# Patient Record
Sex: Female | Born: 1940
Health system: Southern US, Community
[De-identification: ages and names within clinical notes are randomized; demographics above are authoritative.]

## PROBLEM LIST (undated history)

## (undated) DIAGNOSIS — I493 Ventricular premature depolarization: Secondary | ICD-10-CM

## (undated) DIAGNOSIS — D751 Secondary polycythemia: Secondary | ICD-10-CM

## (undated) DIAGNOSIS — E785 Hyperlipidemia, unspecified: Secondary | ICD-10-CM

## (undated) DIAGNOSIS — F419 Anxiety disorder, unspecified: Secondary | ICD-10-CM

## (undated) DIAGNOSIS — Z889 Allergy status to unspecified drugs, medicaments and biological substances status: Secondary | ICD-10-CM

## (undated) DIAGNOSIS — G4700 Insomnia, unspecified: Secondary | ICD-10-CM

## (undated) DIAGNOSIS — G5762 Lesion of plantar nerve, left lower limb: Secondary | ICD-10-CM

## (undated) DIAGNOSIS — I10 Essential (primary) hypertension: Secondary | ICD-10-CM

## (undated) DIAGNOSIS — M1990 Unspecified osteoarthritis, unspecified site: Secondary | ICD-10-CM

## (undated) HISTORY — DX: Secondary polycythemia: D75.1

## (undated) HISTORY — DX: Ventricular premature depolarization: I49.3

## (undated) HISTORY — DX: Hyperlipidemia, unspecified: E78.5

## (undated) HISTORY — DX: Insomnia, unspecified: G47.00

## (undated) HISTORY — PX: ABDOMINAL HYSTERECTOMY: SHX81

## (undated) HISTORY — DX: Allergy status to unspecified drugs, medicaments and biological substances: Z88.9

## (undated) HISTORY — DX: Unspecified osteoarthritis, unspecified site: M19.90

## (undated) HISTORY — PX: VESICOVAGINAL FISTULA CLOSURE W/ TAH: SUR271

## (undated) HISTORY — PX: KNEE ARTHROSCOPY: SUR90

## (undated) HISTORY — DX: Lesion of plantar nerve, left lower limb: G57.62

## (undated) HISTORY — PX: TONSILLECTOMY: SHX5217

## (undated) HISTORY — DX: Essential (primary) hypertension: I10

## (undated) HISTORY — DX: Anxiety disorder, unspecified: F41.9

---

## 1997-10-10 ENCOUNTER — Ambulatory Visit (HOSPITAL_COMMUNITY): Admission: RE | Admit: 1997-10-10 | Discharge: 1997-10-10 | Payer: Self-pay | Admitting: Gastroenterology

## 1998-09-25 ENCOUNTER — Other Ambulatory Visit: Admission: RE | Admit: 1998-09-25 | Discharge: 1998-09-25 | Payer: Self-pay | Admitting: *Deleted

## 1998-10-08 ENCOUNTER — Other Ambulatory Visit: Admission: RE | Admit: 1998-10-08 | Discharge: 1998-10-08 | Payer: Self-pay | Admitting: *Deleted

## 1999-08-17 ENCOUNTER — Other Ambulatory Visit: Admission: RE | Admit: 1999-08-17 | Discharge: 1999-08-17 | Payer: Self-pay | Admitting: *Deleted

## 2001-11-13 ENCOUNTER — Other Ambulatory Visit: Admission: RE | Admit: 2001-11-13 | Discharge: 2001-11-13 | Payer: Self-pay | Admitting: Internal Medicine

## 2002-12-18 ENCOUNTER — Encounter (INDEPENDENT_AMBULATORY_CARE_PROVIDER_SITE_OTHER): Payer: Self-pay

## 2002-12-18 ENCOUNTER — Ambulatory Visit (HOSPITAL_COMMUNITY): Admission: RE | Admit: 2002-12-18 | Discharge: 2002-12-18 | Payer: Self-pay | Admitting: Gastroenterology

## 2005-12-10 ENCOUNTER — Other Ambulatory Visit: Admission: RE | Admit: 2005-12-10 | Discharge: 2005-12-10 | Payer: Self-pay | Admitting: Internal Medicine

## 2007-07-30 ENCOUNTER — Emergency Department (HOSPITAL_BASED_OUTPATIENT_CLINIC_OR_DEPARTMENT_OTHER): Admission: EM | Admit: 2007-07-30 | Discharge: 2007-07-30 | Payer: Self-pay | Admitting: Emergency Medicine

## 2009-05-04 ENCOUNTER — Encounter: Payer: Self-pay | Admitting: Cardiovascular Disease

## 2009-05-05 ENCOUNTER — Encounter: Payer: Self-pay | Admitting: Internal Medicine

## 2009-06-05 DIAGNOSIS — I10 Essential (primary) hypertension: Secondary | ICD-10-CM | POA: Insufficient documentation

## 2009-06-05 DIAGNOSIS — E782 Mixed hyperlipidemia: Secondary | ICD-10-CM | POA: Insufficient documentation

## 2009-06-06 ENCOUNTER — Ambulatory Visit: Payer: Self-pay | Admitting: Internal Medicine

## 2009-06-17 ENCOUNTER — Ambulatory Visit: Payer: Self-pay

## 2009-06-17 ENCOUNTER — Ambulatory Visit (HOSPITAL_COMMUNITY): Admission: RE | Admit: 2009-06-17 | Discharge: 2009-06-17 | Payer: Self-pay | Admitting: Internal Medicine

## 2009-06-17 ENCOUNTER — Ambulatory Visit: Payer: Self-pay | Admitting: Cardiology

## 2009-06-17 ENCOUNTER — Encounter: Payer: Self-pay | Admitting: Internal Medicine

## 2009-06-20 ENCOUNTER — Encounter: Payer: Self-pay | Admitting: Internal Medicine

## 2009-07-14 ENCOUNTER — Ambulatory Visit: Payer: Self-pay

## 2009-07-14 ENCOUNTER — Ambulatory Visit: Payer: Self-pay | Admitting: Internal Medicine

## 2009-07-14 ENCOUNTER — Encounter: Payer: Self-pay | Admitting: Internal Medicine

## 2009-07-14 ENCOUNTER — Ambulatory Visit (HOSPITAL_COMMUNITY): Admission: RE | Admit: 2009-07-14 | Discharge: 2009-07-14 | Payer: Self-pay | Admitting: Internal Medicine

## 2010-03-12 NOTE — Miscellaneous (Signed)
  Clinical Lists Changes  Orders: Added new Referral order of Stress Echo (Stress Echo) - Signed 

## 2010-03-12 NOTE — Cardiovascular Report (Signed)
Summary: Outpatient Coinsurance Notice   Outpatient Coinsurance Notice   Imported By: Roderic Ovens 06/23/2009 12:26:38  _____________________________________________________________________  External Attachment:    Type:   Image     Comment:   External Document

## 2010-03-12 NOTE — Letter (Signed)
Summary: [facility]  [facility]   Imported By: Debby Freiberg 08/05/2009 11:34:22  _____________________________________________________________________  External Attachment:    Type:   Image     Comment:   External Document

## 2010-03-12 NOTE — Assessment & Plan Note (Signed)
Summary: NP6/History Heart Disease-mb   Primary Provider:  Dr. Marisue Brooklyn  CC:  sob, pt states she has been having alot of stress in her life, and fatigue.  History of Present Illness: Patient is a 70 year old with a history of hypertension and dyslipidemia.  She was referred for evaluation of an abnormal EKG. The patient is followed by A. Elisabeth Most.  She was seen in clinic recently.  EKG showed some nonspecific ST T wave changes.  WIth her history and with famiy history of vascular issues she was referred for cardiology evaluation The patient denies chest pains.  She is under increased stress with family issues.  She says she feels more fatigued, sometimes like she cant keep up like she is used to .  Questions if it is due to the increased stress and increased demands that she is facing.  Current Medications (verified): 1)  Aspirin 81 Mg Tbec (Aspirin) .... Take One Tablet By Mouth Daily 2)  Calcium 1800 Mg Tabs (Calcium Carbonate) .Marland Kitchen.. 1 Tab By Mouth Once Daily 3)  Fish Oil   Oil (Fish Oil) .Marland Kitchen.. 1 Tab By Mouth Once Daily 4)  Cvs Cranberry 475 Mg Caps (Cranberry) .Marland Kitchen.. 1 Tab By Mouth Once Daily 5)  Vitamin D3 2000 Unit Caps (Cholecalciferol) .Marland Kitchen.. 1 Tab Po Once Daily 6)  Zyrtec Allergy 10 Mg Tabs (Cetirizine Hcl) .... As Needed 7)  Multivitamins   Tabs (Multiple Vitamin) .Marland Kitchen.. 1 Tab Po Once Daily 8)  Simvastatin 80 Mg Tabs (Simvastatin) .... Take One Tablet By Mouth Daily At Bedtime 9)  Quinapril Hcl 40 Mg Tabs (Quinapril Hcl) .Marland Kitchen.. 1 Tab By Mouth Once Daily 10)  Hydrochlorothiazide 12.5 Mg Tabs (Hydrochlorothiazide) .... Take One Tablet By Mouth Daily. 11)  Zolpidem Tartrate 10 Mg Tabs (Zolpidem Tartrate) .... As Needed 12)  Glucosamine-Chondroitin   Caps (Glucosamine-Chondroit-Vit C-Mn) .Marland Kitchen.. 1 Tab By Mouth Once Daily  Allergies: 1)  ! Pcn  Past History:  Past Medical History: Dyslipidemia .  On Rx for 20 years. Hypertension Allergies. PCV  Past Surgical  History: Tonsillectomy Hysterectomy Arthoscopy of R knee  Family History: Mother with hx of angina.  Died of necrosis of intestines at age 40 Sister diedat age 43 of necrotic intestines Brothers *(2) with increased lipids One brother with Polycythemia Vera  Social History: Married.  No tobacco  Rare EtOH  Review of Systems       All systems reviewed.  negative to the above problem except as noted abov.e  Vital Signs:  Patient profile:   70 year old female Height:      65 inches Weight:      182 pounds BMI:     30.40 Pulse rate:   73 / minute Resp:     14 per minute BP sitting:   116 / 75  (left arm)  Vitals Entered By: Kem Parkinson (June 06, 2009 12:12 PM)  Physical Exam  Additional Exam:  Patient is in NAD HEENT:  Normocephalic, atraumatic. EOMI, PERRLA.  Neck: JVP is normal. No thyromegaly. No bruits.  Lungs: clear to auscultation. No rales no wheezes.  Heart: Regular rate and rhythm. Normal S1, S2. No S3.   No significant murmurs. PMI not displaced.  Abdomen:  Supple, nontender. Normal bowel sounds. No masses. No hepatomegaly.  Extremities:   Good distal pulses throughout. No lower extremity edema.  Musculoskeletal :moving all extremities.  Neuro:   alert and oriented x3.    EKG  Procedure date:  06/06/2009  Findings:  NSR.  73 bpm.  Occasional PVC. T wave inversion III, AVF.  Different from outside EKGs.  Impression & Recommendations:  Problem # 1:  OTHER DYSPNEA AND RESPIRATORY ABNORMALITIES (ICD-786.09) Patient complains of some giving out with activities.  Does have nonspecific EKG findings but they are changing.  I would recommend and echo to start.  If LV is normal and windows are good  rec stress echo, otherwise stress myoview.   COntinue activities as tolerated  Problem # 2:  HYPERTENSION (ICD-401.9) Good control Her updated medication list for this problem includes:    Aspirin 81 Mg Tbec (Aspirin) .Marland Kitchen... Take one tablet by mouth daily     Quinapril Hcl 40 Mg Tabs (Quinapril hcl) .Marland Kitchen... 1 tab by mouth once daily    Hydrochlorothiazide 12.5 Mg Tabs (Hydrochlorothiazide) .Marland Kitchen... Take one tablet by mouth daily.  Orders: EKG w/ Interpretation (93000)  Problem # 3:  HYPERCHOLESTEROLEMIA (ICD-272.0) Good control on recent check by Dr. Elisabeth Most.  Other Orders: Echocardiogram (Echo)  Patient Instructions: 1)  Your physician has requested that you have an echocardiogram.  Echocardiography is a painless test that uses sound waves to create images of your heart. It provides your doctor with information about the size and shape of your heart and how well your heart's chambers and valves are working.  This procedure takes approximately one hour. There are no restrictions for this procedure. we will call you with results.

## 2010-06-26 NOTE — Op Note (Signed)
NAMEKARLEY, PHO NO.:  [id number]   MEDICAL RECORD NO.:  [id number]                   PATIENT TYPE:  AMB   LOCATION:  ENDO                                 FACILITY:  [facility]   PHYSICIAN:  Petra Kuba, M.D.                 DATE OF BIRTH:  [birth year]   DATE OF PROCEDURE:  12/18/2002  DATE OF DISCHARGE:                                 OPERATIVE REPORT   PROCEDURE:  Colonoscopy with biopsy.   INDICATIONS FOR PROCEDURE:  A patient with a history of colon polyps due for  colonic screening.  Consent was signed after risks, benefits, methods, and  options were thoroughly discussed in the past.   MEDICINES USED:  Demerol 80, Versed 7.   DESCRIPTION OF PROCEDURE:  Rectal inspection was pertinent for external  hemorrhoids. Digital exam was negative. The video pediatric adjustable  colonoscope was inserted and fairly easily advanced around the colon to the  cecum. This did require some abdominal pressure but no position changes. No  obvious abnormality was seen on insertion. The cecum was identified by the  appendiceal orifice and the ileocecal valve. In fact, the scope was inserted  for a quick look into the terminal ileum which on quick evaluation was  grossly normal. The scope was slowly withdrawn.  Unfortunately the prep was  only fairly adequate. There was a moderate amount of stool residue which  could not all be washed and suctioned but on slow withdrawal through the  colon no abnormalities were seen until we withdrew back to the rectum.  Specifically, no polyps, tumors, masses or diverticula were seen until the  rectum. In the rectum on retroflexion, a small polyp was seen and was cold  biopsied x2.  There were also some small internal hemorrhoids. Anal rectal  pull through confirmed the hemorrhoids. The scope was reinserted a short  ways up the left side of the colon, air was suctioned, scope removed.  The  patient tolerated the procedure well.  There was no obvious or immediate  complications.   ENDOSCOPIC DIAGNOSIS:  1. Internal and external hemorrhoids.  2. Tiny rectal polyps cold biopsied.  3. Fairly adequate prep but could miss small lesions.  4. Otherwise within normal limits to the cecum and a quick look in the     terminal ileum.   PLAN:  Await pathology. Would probably recheck colon screening in five  years. Happy to see back p.r.n. otherwise return care to Dr. Elisabeth Most for  the customary health care maintenance to include yearly rectals and guaiacs.                                               Petra Kuba, M.D.   MEM/MEDQ  D:  12/18/2002  T:  12/18/2002  Job:  [id number]   cc:   Lovenia Kim, D.O.  [address]  [place]  [place] [address]  Fax: [phone]

## 2010-08-13 ENCOUNTER — Encounter: Payer: Self-pay | Admitting: Internal Medicine

## 2012-05-15 LAB — HM COLONOSCOPY

## 2012-12-17 ENCOUNTER — Other Ambulatory Visit: Payer: Self-pay | Admitting: Internal Medicine

## 2012-12-18 ENCOUNTER — Other Ambulatory Visit: Payer: Self-pay | Admitting: Emergency Medicine

## 2012-12-18 ENCOUNTER — Other Ambulatory Visit: Payer: Self-pay | Admitting: Internal Medicine

## 2012-12-18 ENCOUNTER — Telehealth: Payer: Self-pay | Admitting: *Deleted

## 2012-12-18 DIAGNOSIS — G4700 Insomnia, unspecified: Secondary | ICD-10-CM

## 2012-12-18 MED ORDER — LORAZEPAM 2 MG PO TABS: 2.0000 mg | ORAL_TABLET | Freq: Every day | ORAL | Status: DC

## 2012-12-18 NOTE — Telephone Encounter (Signed)
[facility] = [facility]. [address] [place] [address]       FAX # [phone]    PH#      [phone] RE[facility]  ATIVAN 2MG   #30

## 2012-12-18 NOTE — Telephone Encounter (Signed)
[facility] called to [facility] [employer]

## 2013-01-02 ENCOUNTER — Other Ambulatory Visit: Payer: Self-pay | Admitting: Physician Assistant

## 2013-01-02 NOTE — Telephone Encounter (Signed)
RX called in .

## 2013-03-12 ENCOUNTER — Encounter: Payer: Self-pay | Admitting: *Deleted

## 2013-03-12 DIAGNOSIS — G5762 Lesion of plantar nerve, left lower limb: Secondary | ICD-10-CM | POA: Insufficient documentation

## 2013-03-12 DIAGNOSIS — F419 Anxiety disorder, unspecified: Secondary | ICD-10-CM | POA: Insufficient documentation

## 2013-03-12 DIAGNOSIS — M1990 Unspecified osteoarthritis, unspecified site: Secondary | ICD-10-CM | POA: Insufficient documentation

## 2013-03-13 ENCOUNTER — Ambulatory Visit (INDEPENDENT_AMBULATORY_CARE_PROVIDER_SITE_OTHER): Payer: 59 | Admitting: Emergency Medicine

## 2013-03-13 ENCOUNTER — Encounter: Payer: Self-pay | Admitting: Emergency Medicine

## 2013-03-13 ENCOUNTER — Other Ambulatory Visit: Payer: Self-pay | Admitting: Physician Assistant

## 2013-03-13 VITALS — BP 126/78 | HR 68 | Temp 98.2°F | Resp 18 | Ht 64.5 in | Wt 180.0 lb

## 2013-03-13 DIAGNOSIS — R7309 Other abnormal glucose: Secondary | ICD-10-CM

## 2013-03-13 DIAGNOSIS — I10 Essential (primary) hypertension: Secondary | ICD-10-CM

## 2013-03-13 DIAGNOSIS — J309 Allergic rhinitis, unspecified: Secondary | ICD-10-CM

## 2013-03-13 DIAGNOSIS — E782 Mixed hyperlipidemia: Secondary | ICD-10-CM

## 2013-03-13 LAB — CBC WITH DIFFERENTIAL/PLATELET
Basophils Absolute: 0 10*3/uL (ref 0.0–0.1)
Basophils Relative: 1 % (ref 0–1)
Eosinophils Absolute: 0.2 10*3/uL (ref 0.0–0.7)
Eosinophils Relative: 3 % (ref 0–5)
HCT: 39.9 % (ref 36.0–46.0)
Hemoglobin: 13.9 g/dL (ref 12.0–15.0)
Lymphocytes Relative: 15 % (ref 12–46)
Lymphs Abs: 1.2 10*3/uL (ref 0.7–4.0)
MCH: 31.5 pg (ref 26.0–34.0)
MCHC: 34.8 g/dL (ref 30.0–36.0)
MCV: 90.5 fL (ref 78.0–100.0)
Monocytes Absolute: 0.7 10*3/uL (ref 0.1–1.0)
Monocytes Relative: 10 % (ref 3–12)
Neutro Abs: 5.4 10*3/uL (ref 1.7–7.7)
Neutrophils Relative %: 71 % (ref 43–77)
Platelets: 229 10*3/uL (ref 150–400)
RBC: 4.41 MIL/uL (ref 3.87–5.11)
RDW: 13.5 % (ref 11.5–15.5)
WBC: 7.6 10*3/uL (ref 4.0–10.5)

## 2013-03-13 LAB — HEMOGLOBIN A1C
Hgb A1c MFr Bld: 5.5 % (ref <5.7)
Mean Plasma Glucose: 111 mg/dL (ref <117)

## 2013-03-13 NOTE — Progress Notes (Signed)
   Subjective:    Patient ID: Kelsey Lopez, female    DOB: 1941-02-08, 73 y.o.   MRN: [id number]  HPI Comments: 73 yo female for presents for 3 month F/U for HTN, Cholesterol, Pre-Dm, D. Deficient. She keeps active for exercise. Her BP has been good at home. She was eating healthy until recent cruise. LAST LABS T 190 TG 139 H 55 L 107 MAG 1.8 A1C 5.8 D 65  She has had dry cough since recent trip. She denies drainage increase but has history of allergies. She is not taking any OTC.  Hyperlipidemia  Hypertension Associated symptoms include anxiety.  Anxiety    ALLERGIES Ampicillin; Mevacor; Penicillins; and Sulfa antibiotics  Past Medical History  Diagnosis Date  . Dyslipidemia     on Rx x20 years  . HTN (hypertension)   . Multiple allergies   . PVC (premature ventricular contraction)   . Hyperlipidemia   . Anxiety   . Insomnia   . Morton's neuroma of left foot   . Osteoarthritis        Review of Systems  HENT: Positive for congestion.   Respiratory: Positive for cough.   All other systems reviewed and are negative.   BP 126/78  Pulse 68  Temp(Src) 98.2 F (36.8 C) (Temporal)  Resp 18  Ht 5' 4.5" (1.638 m)  Wt 180 lb (81.647 kg)  BMI 30.43 kg/m2     Objective:   Physical Exam  Nursing note and vitals reviewed. Constitutional: She is oriented to person, place, and time. She appears well-developed and well-nourished. No distress.  HENT:  Head: Normocephalic and atraumatic.  Right Ear: External ear normal.  Left Ear: External ear normal.  Nose: Nose normal.  Mouth/Throat: Oropharynx is clear and moist. No oropharyngeal exudate.  Cloudy TM's bilaterally   Eyes: Conjunctivae and EOM are normal.  Neck: Normal range of motion. Neck supple. No JVD present. No thyromegaly present.  Cardiovascular: Normal rate, regular rhythm, normal heart sounds and intact distal pulses.   Pulmonary/Chest: Effort normal and breath sounds normal.  Abdominal: Soft. Bowel sounds  are normal. She exhibits no distension and no mass. There is no tenderness. There is no rebound and no guarding.  Musculoskeletal: Normal range of motion. She exhibits no edema and no tenderness.  Lymphadenopathy:    She has no cervical adenopathy.  Neurological: She is alert and oriented to person, place, and time. No cranial nerve deficit.  Skin: Skin is warm and dry. No rash noted. No erythema. No pallor.  Psychiatric: She has a normal mood and affect. Her behavior is normal. Judgment and thought content normal.          Assessment & Plan:  1.  3 month F/U for HTN, Cholesterol, Pre-Dm, D. Deficient. Needs healthy diet, cardio QD and obtain healthy weight. Check Labs, Check BP if >130/80 call office 2. Allergic rhinitis- switch to Allegra OTC, increase H2o, allergy hygiene explained. W/c if sx increase for ABX

## 2013-03-13 NOTE — Patient Instructions (Signed)
Allergic Rhinitis Allergic rhinitis is when the mucous membranes in the nose respond to allergens. Allergens are particles in the air that cause your body to have an allergic reaction. This causes you to release allergic antibodies. Through a chain of events, these eventually cause you to release histamine into the blood stream. Although meant to protect the body, it is this release of histamine that causes your discomfort, such as frequent sneezing, congestion, and an itchy, runny nose.  CAUSES  Seasonal allergic rhinitis (hay fever) is caused by pollen allergens that may come from grasses, trees, and weeds. Year-round allergic rhinitis (perennial allergic rhinitis) is caused by allergens such as house dust mites, pet dander, and mold spores.  SYMPTOMS   Nasal stuffiness (congestion).  Itchy, runny nose with sneezing and tearing of the eyes. DIAGNOSIS  Your health care provider can help you determine the allergen or allergens that trigger your symptoms. If you and your health care provider are unable to determine the allergen, skin or blood testing may be used. TREATMENT  Allergic Rhinitis does not have a cure, but it can be controlled by:  Medicines and allergy shots (immunotherapy).  Avoiding the allergen. Hay fever may often be treated with antihistamines in pill or nasal spray forms. Antihistamines block the effects of histamine. There are over-the-counter medicines that may help with nasal congestion and swelling around the eyes. Check with your health care provider before taking or giving this medicine.  If avoiding the allergen or the medicine prescribed do not work, there are many new medicines your health care provider can prescribe. Stronger medicine may be used if initial measures are ineffective. Desensitizing injections can be used if medicine and avoidance does not work. Desensitization is when a patient is given ongoing shots until the body becomes less sensitive to the allergen.  Make sure you follow up with your health care provider if problems continue. HOME CARE INSTRUCTIONS It is not possible to completely avoid allergens, but you can reduce your symptoms by taking steps to limit your exposure to them. It helps to know exactly what you are allergic to so that you can avoid your specific triggers. SEEK MEDICAL CARE IF:   You have a fever.  You develop a cough that does not stop easily (persistent).  You have shortness of breath.  You start wheezing.  Symptoms interfere with normal daily activities. Document Released: 10/20/2000 Document Revised: 11/15/2012 Document Reviewed: 10/02/2012 [employer] Patient Information 2014 [employer].  

## 2013-03-14 LAB — HEPATIC FUNCTION PANEL
ALT: 37 U/L — ABNORMAL HIGH (ref 0–35)
AST: 22 U/L (ref 0–37)
Albumin: 3.8 g/dL (ref 3.5–5.2)
Alkaline Phosphatase: 90 U/L (ref 39–117)
Bilirubin, Direct: 0.1 mg/dL (ref 0.0–0.3)
Indirect Bilirubin: 0.3 mg/dL (ref 0.2–1.2)
Total Bilirubin: 0.4 mg/dL (ref 0.2–1.2)
Total Protein: 6.3 g/dL (ref 6.0–8.3)

## 2013-03-14 LAB — LIPID PANEL
Cholesterol: 149 mg/dL (ref 0–200)
HDL: 46 mg/dL (ref >39)
LDL Cholesterol: 81 mg/dL (ref 0–99)
Total CHOL/HDL Ratio: 3.2 Ratio
Triglycerides: 109 mg/dL (ref <150)
VLDL: 22 mg/dL (ref 0–40)

## 2013-03-14 LAB — BASIC METABOLIC PANEL WITH GFR
BUN: 18 mg/dL (ref 6–23)
CO2: 29 mEq/L (ref 19–32)
Calcium: 9 mg/dL (ref 8.4–10.5)
Chloride: 99 mEq/L (ref 96–112)
Creat: 0.77 mg/dL (ref 0.50–1.10)
GFR, Est African American: 89 mL/min
GFR, Est Non African American: 77 mL/min
Glucose, Bld: 91 mg/dL (ref 70–99)
Potassium: 4.1 mEq/L (ref 3.5–5.3)
Sodium: 137 mEq/L (ref 135–145)

## 2013-03-14 LAB — INSULIN, FASTING: Insulin fasting, serum: 6 u[IU]/mL (ref 3–28)

## 2013-03-17 ENCOUNTER — Other Ambulatory Visit: Payer: Self-pay | Admitting: Physician Assistant

## 2013-03-19 ENCOUNTER — Other Ambulatory Visit: Payer: Self-pay | Admitting: Physician Assistant

## 2013-05-06 ENCOUNTER — Other Ambulatory Visit: Payer: Self-pay | Admitting: Emergency Medicine

## 2013-05-12 ENCOUNTER — Other Ambulatory Visit: Payer: Self-pay | Admitting: Physician Assistant

## 2013-05-15 ENCOUNTER — Other Ambulatory Visit: Payer: Self-pay | Admitting: Physician Assistant

## 2013-05-17 ENCOUNTER — Other Ambulatory Visit: Payer: Self-pay | Admitting: Physician Assistant

## 2013-05-28 ENCOUNTER — Encounter: Payer: Self-pay | Admitting: Emergency Medicine

## 2013-06-06 ENCOUNTER — Encounter: Payer: Self-pay | Admitting: Emergency Medicine

## 2013-06-09 ENCOUNTER — Other Ambulatory Visit: Payer: Self-pay | Admitting: Physician Assistant

## 2013-06-14 ENCOUNTER — Other Ambulatory Visit: Payer: Self-pay | Admitting: Physician Assistant

## 2013-07-22 ENCOUNTER — Other Ambulatory Visit: Payer: Self-pay | Admitting: Emergency Medicine

## 2013-07-23 ENCOUNTER — Encounter: Payer: Self-pay | Admitting: Emergency Medicine

## 2013-07-24 ENCOUNTER — Telehealth: Payer: Self-pay | Admitting: *Deleted

## 2013-07-24 NOTE — Telephone Encounter (Signed)
Patient aware of BMD report.

## 2013-08-01 ENCOUNTER — Other Ambulatory Visit: Payer: Self-pay | Admitting: Physician Assistant

## 2013-08-01 ENCOUNTER — Ambulatory Visit (INDEPENDENT_AMBULATORY_CARE_PROVIDER_SITE_OTHER): Payer: 59 | Admitting: Emergency Medicine

## 2013-08-01 ENCOUNTER — Encounter: Payer: Self-pay | Admitting: Emergency Medicine

## 2013-08-01 VITALS — BP 112/68 | HR 68 | Temp 98.2°F | Resp 18 | Ht 64.5 in | Wt 178.0 lb

## 2013-08-01 DIAGNOSIS — Z1331 Encounter for screening for depression: Secondary | ICD-10-CM

## 2013-08-01 DIAGNOSIS — Z1212 Encounter for screening for malignant neoplasm of rectum: Secondary | ICD-10-CM

## 2013-08-01 DIAGNOSIS — R300 Dysuria: Secondary | ICD-10-CM

## 2013-08-01 DIAGNOSIS — Z789 Other specified health status: Secondary | ICD-10-CM

## 2013-08-01 DIAGNOSIS — R5383 Other fatigue: Secondary | ICD-10-CM

## 2013-08-01 DIAGNOSIS — Z0000 Encounter for general adult medical examination without abnormal findings: Secondary | ICD-10-CM

## 2013-08-01 DIAGNOSIS — I10 Essential (primary) hypertension: Secondary | ICD-10-CM

## 2013-08-01 DIAGNOSIS — R7309 Other abnormal glucose: Secondary | ICD-10-CM

## 2013-08-01 DIAGNOSIS — E782 Mixed hyperlipidemia: Secondary | ICD-10-CM

## 2013-08-01 DIAGNOSIS — E559 Vitamin D deficiency, unspecified: Secondary | ICD-10-CM

## 2013-08-01 DIAGNOSIS — Z23 Encounter for immunization: Secondary | ICD-10-CM

## 2013-08-01 DIAGNOSIS — R5381 Other malaise: Secondary | ICD-10-CM

## 2013-08-01 LAB — CBC WITH DIFFERENTIAL/PLATELET
Basophils Absolute: 0.1 10*3/uL (ref 0.0–0.1)
Basophils Relative: 1 % (ref 0–1)
Eosinophils Absolute: 0.3 10*3/uL (ref 0.0–0.7)
Eosinophils Relative: 4 % (ref 0–5)
HCT: 42.5 % (ref 36.0–46.0)
Hemoglobin: 14.6 g/dL (ref 12.0–15.0)
Lymphocytes Relative: 24 % (ref 12–46)
Lymphs Abs: 1.8 10*3/uL (ref 0.7–4.0)
MCH: 31.5 pg (ref 26.0–34.0)
MCHC: 34.4 g/dL (ref 30.0–36.0)
MCV: 91.6 fL (ref 78.0–100.0)
Monocytes Absolute: 0.7 10*3/uL (ref 0.1–1.0)
Monocytes Relative: 9 % (ref 3–12)
Neutro Abs: 4.7 10*3/uL (ref 1.7–7.7)
Neutrophils Relative %: 62 % (ref 43–77)
Platelets: 250 10*3/uL (ref 150–400)
RBC: 4.64 MIL/uL (ref 3.87–5.11)
RDW: 14.1 % (ref 11.5–15.5)
WBC: 7.6 10*3/uL (ref 4.0–10.5)

## 2013-08-01 NOTE — Patient Instructions (Signed)

## 2013-08-01 NOTE — Progress Notes (Signed)
Patient ID: Kelsey Lopez, female   DOB: [birth year], 73 y.o.   MRN: [id number] MEDICARE ANNUAL WELLNESS VISIT AND CPE  Assessment:  1. CPE/ medicare wellness update- Update screening labs/ History/ Immunizations/ Testing as needed. Advised healthy diet, QD exercise, increase H20 and continue RX/ Vitamins AD.  2. 3 month F/U for HTN, Cholesterol, Pre-Dm, D. Deficient. Needs healthy diet, cardio QD and obtain healthy weight. Check Labs, Check BP if >130/80 call office   3. Dysuria- check labs, hygiene explained  4. Fatigue- check labs, increase activity and H2O   Plan:   During the course of the visit the patient was educated and counseled about appropriate screening and preventive services including:    Pneumococcal vaccine   Influenza vaccine  Td vaccine  Screening electrocardiogram  Screening mammography  Bone densitometry screening  Colorectal cancer screening  Diabetes screening  Glaucoma screening  Nutrition counseling   Advanced directives: given information/requested  Screening recommendations, referrals: ALL FOLLOWING UP TO DATE OR DECLINES  Vaccinations: Tdap vaccine ordered Influenza vaccine not indicated Pneumococcal vaccine declined Shingles vaccine not indicated Hep B vaccine declined  Nutrition assessed and recommended  Colonoscopy declined Mammogram ordered Pap smear not indicated Pelvic exam not indicated Recommended yearly ophthalmology/optometry visit for glaucoma screening and checkup Recommended yearly dental visit for hygiene and checkup Advanced directives - not indicated  Conditions/risks identified: BMI: Discussed weight loss, diet, and increase physical activity.  Increase physical activity: AHA recommends 150 minutes of physical activity a week.  Medications reviewed DEXA- not indicated Diabetes at goal, ACE/ARB therapy Yes. Urinary Incontinence is not an issue: discussed non pharmacology and pharmacology options.  Fall risk: low-  discussed PT, home fall assessment, medications.   Subjective:   Kelsey Lopez is a 73 y.o. female who presents for Medicare Annual Wellness Visit and complete physical.    Date of last medicare wellness visit is unknown.  She is overall doing well and needs a TDAP with a new grandchild at home. She notes mild dysuria but denies other UTI symptoms. She notes mild fatigue.   Her blood pressure has been controlled at home, today their BP is BP: 112/68 mmHg She does workout. She denies chest pain, shortness of breath, dizziness.  She is on cholesterol medication and denies myalgias. Her cholesterol is not at goal. The cholesterol last visit was:   Lab Results  Component Value Date   CHOL 203* 08/01/2013   HDL 54 08/01/2013   LDLCALC 113* 08/01/2013   TRIG 179* 08/01/2013   CHOLHDL 3.8 08/01/2013   She has been working on diet and exercise for prediabetes, and denies paresthesia of the feet and polyuria. Last A1C in the office was:  Lab Results  Component Value Date   HGBA1C 5.8* 08/01/2013   Patient is on Vitamin D supplement.     Names of Other Physician/Practitioners you currently use: Patient Care Team: Unk Pinto, MD as PCP - General (Internal Medicine) Simona Huh, MD as Consulting Physician (Dermatology) Jeryl Columbia, MD as Consulting Physician (Gastroenterology) Fay Records, MD as Consulting Physician (Cardiology) Orvan Seen, [facility]) Tye Savoy, (Dentist)  Medication Review Current Outpatient Prescriptions on File Prior to Visit  Medication Sig Dispense Refill  . aspirin 81 MG EC tablet Take 81 mg by mouth daily.        . cetirizine (ZYRTEC) 10 MG tablet Take 10 mg by mouth as needed.        . Cholecalciferol (VITAMIN D3) 2000 UNITS capsule Take 2,000 Units by mouth daily.        Marland Kitchen  citalopram (CELEXA) 40 MG tablet TAKE ONE TABLET BY MOUTH ONE TIME DAILY   90 tablet  0  . Cranberry ([facility] CRANBERRY) 475 MG CAPS Take 1 capsule by mouth daily.        . fish  oil-omega-3 fatty acids 1000 MG capsule Take 2 g by mouth daily.        . Flaxseed, Linseed, (FLAX SEED OIL) 1000 MG CAPS Take 1,000 mg by mouth daily.      . Glucosamine 500 MG CAPS Take by mouth daily.      . hydrochlorothiazide (HYDRODIURIL) 12.5 MG tablet Take 12.5 mg by mouth daily.        Marland Kitchen LORazepam (ATIVAN) 2 MG tablet TAKE ONE-HALF TO ONE TABLET BY MOUTH AT BEDTIME   30 tablet  3  . Magnesium 400 MG CAPS Take 400 mg by mouth daily.      . montelukast (SINGULAIR) 10 MG tablet Take 10 mg by mouth at bedtime.      . Multiple Vitamin (MULTIVITAMIN) tablet Take 1 tablet by mouth daily.        . NON FORMULARY Calcium 1800 mg 1 tablet daily       . quinapril (ACCUPRIL) 40 MG tablet TAKE ONE TABLET BY MOUTH ONE TIME DAILY   90 tablet  1  . simvastatin (ZOCOR) 40 MG tablet TAKE ONE TABLET BY MOUTH AT BEDTIME   90 tablet  0   No current facility-administered medications on file prior to visit.   Allergies  Allergen Reactions  . Ampicillin     rash  . Mevacor [Lovastatin]     Elevated LFT's  . Penicillins     REACTION: hives and  itching  . Sulfa Antibiotics     rash     Current Problems (verified) Patient Active Problem List   Diagnosis Date Noted  . Polycythemia   . Hyperlipidemia   . Anxiety   . Insomnia   . Morton's neuroma of left foot   . Osteoarthritis   . OTHER DYSPNEA AND RESPIRATORY ABNORMALITIES 06/06/2009  . NEOPLASM UNCERTAIN BEHAVIOR POLYCYTHEMIA VERA 06/05/2009  . HYPERCHOLESTEROLEMIA 06/05/2009  . HYPERTENSION 06/05/2009     Screening Tests Health Maintenance  Topic Date Due  . Mammogram  10/29/1990  . Colonoscopy  10/29/1990  . Pneumococcal Polysaccharide Vaccine Age 19 And Over  10/28/2005  . Influenza Vaccine  09/08/2013  . Tetanus/tdap  08/02/2023  . Zostavax  Completed    Immunization History  Administered Date(s) Administered  . Influenza Split 11/15/2011, 11/28/2012  . Tdap 08/01/2013  . Zoster 05/25/2011    Preventative care: Last  colonoscopy: 05/2012 Last mammogram: 07/19/13 Last pap smear/pelvic exam: 2007  DEXA:07/19/13 osteopenia ECHO: 07/14/09  Prior vaccinations: TD: 2005  Influenza: 2014  Pneumococcal: 2004 Shingles/Zostavax: 2013  Past Medical History  Diagnosis Date  . Dyslipidemia     on Rx x20 years  . HTN (hypertension)   . Multiple allergies   . PVC (premature ventricular contraction)   . Hyperlipidemia   . Anxiety   . Insomnia   . Morton's neuroma of left foot   . Osteoarthritis   . Polycythemia    Past Surgical History  Procedure Laterality Date  . Tonsillectomy    . Vesicovaginal fistula closure w/ tah    . Knee arthroscopy      R  . Abdominal hysterectomy      History  Substance Use Topics  . Smoking status: Never Smoker   . Smokeless tobacco: Not on file  Comment: no tobacco   . Alcohol Use: Yes     Comment: rare   Family History  Problem Relation Age of Onset  . Osteoarthritis Mother   . Heart attack Mother   . Heart disease Mother   . Cancer Father     lung  . Osteoarthritis Father   . Hyperlipidemia Brother   . Diabetes Brother      Risk Factors: Osteoporosis: postmenopausal estrogen deficiency History of fracture in the past year: no  Tobacco History  Substance Use Topics  . Smoking status: Never Smoker   . Smokeless tobacco: Not on file     Comment: no tobacco   . Alcohol Use: Yes     Comment: rare   She does not smoke.  Patient is a former smoker. Are there smokers in your home (other than you)?  Yes  Alcohol Current alcohol use: none  Caffeine Current caffeine use: coffee 1 /day  Exercise  Current exercise: gardening and walking  Nutrition/Diet Current diet: in general, a "healthy" diet    Cardiac risk factors: advanced age (older than 51 for men, 20 for women), dyslipidemia and hypertension.  Depression Screen (Note: if answer to either of the following is "Yes", a more complete depression screening is indicated)   Q1: Over the past  two weeks, have you felt down, depressed or hopeless? No  Q2: Over the past two weeks, have you felt little interest or pleasure in doing things? No  Have you lost interest or pleasure in daily life? No  Do you often feel hopeless? No  Do you cry easily over simple problems? No  Activities of Daily Living In your present state of health, do you have any difficulty performing the following activities?:  Driving? No Managing money?  No Feeding yourself? No Getting from bed to chair? No Climbing a flight of stairs? No Preparing food and eating?: No Bathing or showering? No Getting dressed: No Getting to the toilet? No Using the toilet:No Moving around from place to place: No In the past year have you fallen or had a near fall?:No   Are you sexually active?  No  Do you have more than one partner?  No  Vision Difficulties: No  Hearing Difficulties: No Do you often ask people to speak up or repeat themselves? No Do you experience ringing or noises in your ears? No Do you have difficulty understanding soft or whispered voices? No  Cognition  Do you feel that you have a problem with memory?No  Do you often misplace items? No  Do you feel safe at home?  No  Advanced directives Does patient have a Health Care Power of Attorney? Yes Does patient have a Living Will? Yes   Objective:     Blood pressure 112/68, pulse 68, temperature 98.2 F (36.8 C), temperature source Temporal, resp. rate 18, height 5' 4.5" (1.638 m), weight 178 lb (80.74 kg). Body mass index is 30.09 kg/(m^2).  General appearance: alert, no distress, WD/WN,  female Cognitive Testing  Alert? Yes  Normal Appearance?Yes  Oriented to person? Yes  Place? Yes   Time? Yes  Recall of three objects?  Yes  Can perform simple calculations? Yes  Displays appropriate judgment?Yes  Can read the correct time from a watch face?Yes  HEENT: normocephalic, sclerae anicteric, TMs pearly, nares patent, no discharge or  erythema, pharynx normal Oral cavity: MMM, no lesions Neck: supple, no lymphadenopathy, no thyromegaly, no masses Heart: RRR, normal S1, S2, no murmurs Lungs: CTA  bilaterally, no wheezes, rhonchi, or rales Abdomen: +bs, soft, non tender, non distended, no masses, no hepatomegaly, no splenomegaly Musculoskeletal: nontender, no swelling, no obvious deformity Extremities: no edema, no cyanosis, no clubbing Pulses: 2+ symmetric, upper and lower extremities, normal cap refill Neurological: alert, oriented x 3, CN2-12 intact, strength normal upper extremities and lower extremities, sensation normal throughout, DTRs 2+ throughout, no cerebellar signs, gait normal Skin: WNL Psychiatric: normal affect, behavior normal, pleasant  Breast:  nontender, no masses or lumps, no skin changes, no nipple discharge or inversion, no axillary lymphadenopathy Gyn: defer  Rectal: defer  AORTA SCAN WNL EKG NSCSPT   Medicare Attestation I have personally reviewed: The patient's medical and social history Their use of alcohol, tobacco or illicit drugs Their current medications and supplements The patient's functional ability including ADLs,fall risks, home safety risks, cognitive, and hearing and visual impairment Diet and physical activities Evidence for depression or mood disorders  The patient's weight, height, BMI, and visual acuity have been recorded in the chart.  I have made referrals, counseling, and provided education to the patient based on review of the above and I have provided the patient with a written personalized care plan for preventive services.     Kelby Aline, R, PA-C   08/05/2013

## 2013-08-02 ENCOUNTER — Other Ambulatory Visit: Payer: Self-pay | Admitting: Physician Assistant

## 2013-08-02 LAB — HEPATIC FUNCTION PANEL
ALT: 33 U/L (ref 0–35)
AST: 28 U/L (ref 0–37)
Albumin: 4.3 g/dL (ref 3.5–5.2)
Alkaline Phosphatase: 78 U/L (ref 39–117)
Bilirubin, Direct: 0.1 mg/dL (ref 0.0–0.3)
Indirect Bilirubin: 0.6 mg/dL (ref 0.2–1.2)
Total Bilirubin: 0.7 mg/dL (ref 0.2–1.2)
Total Protein: 6.4 g/dL (ref 6.0–8.3)

## 2013-08-02 LAB — BASIC METABOLIC PANEL WITH GFR
BUN: 8 mg/dL (ref 6–23)
CO2: 26 mEq/L (ref 19–32)
Calcium: 9.5 mg/dL (ref 8.4–10.5)
Chloride: 101 mEq/L (ref 96–112)
Creat: 0.52 mg/dL (ref 0.50–1.10)
GFR, Est African American: >89 mL/min
GFR, Est Non African American: >89 mL/min
Glucose, Bld: 93 mg/dL (ref 70–99)
Potassium: 3.9 mEq/L (ref 3.5–5.3)
Sodium: 138 mEq/L (ref 135–145)

## 2013-08-02 LAB — HEMOGLOBIN A1C
Hgb A1c MFr Bld: 5.8 % — ABNORMAL HIGH (ref <5.7)
Mean Plasma Glucose: 120 mg/dL — ABNORMAL HIGH (ref <117)

## 2013-08-02 LAB — URINALYSIS, ROUTINE W REFLEX MICROSCOPIC
Bilirubin Urine: NEGATIVE
Glucose, UA: NEGATIVE mg/dL
Ketones, ur: NEGATIVE mg/dL
Leukocytes, UA: NEGATIVE
Nitrite: NEGATIVE
Protein, ur: NEGATIVE mg/dL
Specific Gravity, Urine: 1.007 (ref 1.005–1.030)
Urobilinogen, UA: 0.2 mg/dL (ref 0.0–1.0)
pH: 7 (ref 5.0–8.0)

## 2013-08-02 LAB — MICROALBUMIN / CREATININE URINE RATIO
Creatinine, Urine: 22 mg/dL
Microalb Creat Ratio: 92.3 mg/g — ABNORMAL HIGH (ref 0.0–30.0)
Microalb, Ur: 2.03 mg/dL — ABNORMAL HIGH (ref 0.00–1.89)

## 2013-08-02 LAB — URINALYSIS, MICROSCOPIC ONLY
Casts: NONE SEEN
Crystals: NONE SEEN
Squamous Epithelial / LPF: NONE SEEN

## 2013-08-02 LAB — LIPID PANEL
Cholesterol: 203 mg/dL — ABNORMAL HIGH (ref 0–200)
HDL: 54 mg/dL (ref >39)
LDL Cholesterol: 113 mg/dL — ABNORMAL HIGH (ref 0–99)
Total CHOL/HDL Ratio: 3.8 Ratio
Triglycerides: 179 mg/dL — ABNORMAL HIGH (ref <150)
VLDL: 36 mg/dL (ref 0–40)

## 2013-08-02 LAB — MAGNESIUM: Magnesium: 1.8 mg/dL (ref 1.5–2.5)

## 2013-08-02 LAB — VITAMIN D 25 HYDROXY (VIT D DEFICIENCY, FRACTURES): Vit D, 25-Hydroxy: 83 ng/mL (ref 30–89)

## 2013-08-02 LAB — TSH: TSH: 3.367 u[IU]/mL (ref 0.350–4.500)

## 2013-08-02 LAB — INSULIN, FASTING: Insulin fasting, serum: 27 u[IU]/mL (ref 3–28)

## 2013-08-03 LAB — URINE CULTURE: Colony Count: >=100000

## 2013-08-05 ENCOUNTER — Encounter: Payer: Self-pay | Admitting: Emergency Medicine

## 2013-08-05 ENCOUNTER — Other Ambulatory Visit: Payer: Self-pay | Admitting: Emergency Medicine

## 2013-08-05 MED ORDER — CIPROFLOXACIN HCL 250 MG PO TABS: 250.0000 mg | ORAL_TABLET | Freq: Two times a day (BID) | ORAL | Status: AC

## 2013-08-11 ENCOUNTER — Other Ambulatory Visit: Payer: Self-pay | Admitting: Physician Assistant

## 2013-08-28 ENCOUNTER — Other Ambulatory Visit: Payer: Self-pay | Admitting: Internal Medicine

## 2013-09-06 ENCOUNTER — Ambulatory Visit (INDEPENDENT_AMBULATORY_CARE_PROVIDER_SITE_OTHER): Payer: Medicare Other | Admitting: *Deleted

## 2013-09-06 DIAGNOSIS — N390 Urinary tract infection, site not specified: Secondary | ICD-10-CM

## 2013-09-06 NOTE — Progress Notes (Signed)
Patient presents for 1 month recheck UA and C&S.  Patient completes abx AD and denies any current UTI symptoms.

## 2013-09-07 LAB — URINALYSIS, ROUTINE W REFLEX MICROSCOPIC
Bilirubin Urine: NEGATIVE
Glucose, UA: NEGATIVE mg/dL
Ketones, ur: NEGATIVE mg/dL
Nitrite: NEGATIVE
Protein, ur: NEGATIVE mg/dL
Specific Gravity, Urine: 1.01 (ref 1.005–1.030)
Urobilinogen, UA: 0.2 mg/dL (ref 0.0–1.0)
pH: 6.5 (ref 5.0–8.0)

## 2013-09-07 LAB — URINALYSIS, MICROSCOPIC ONLY
Bacteria, UA: NONE SEEN
Casts: NONE SEEN
Crystals: NONE SEEN
Squamous Epithelial / LPF: NONE SEEN

## 2013-09-08 LAB — URINE CULTURE
Colony Count: NO GROWTH
Organism ID, Bacteria: NO GROWTH

## 2013-09-25 ENCOUNTER — Other Ambulatory Visit: Payer: Self-pay | Admitting: Emergency Medicine

## 2013-09-25 ENCOUNTER — Other Ambulatory Visit: Payer: Self-pay | Admitting: Physician Assistant

## 2013-10-02 ENCOUNTER — Ambulatory Visit (INDEPENDENT_AMBULATORY_CARE_PROVIDER_SITE_OTHER): Payer: Medicare Other | Admitting: Physician Assistant

## 2013-10-02 ENCOUNTER — Encounter: Payer: Self-pay | Admitting: Physician Assistant

## 2013-10-02 VITALS — BP 120/60 | HR 76 | Temp 97.9°F | Resp 16 | Ht 64.5 in | Wt 175.0 lb

## 2013-10-02 DIAGNOSIS — D751 Secondary polycythemia: Secondary | ICD-10-CM

## 2013-10-02 DIAGNOSIS — D45 Polycythemia vera: Secondary | ICD-10-CM

## 2013-10-02 DIAGNOSIS — Z23 Encounter for immunization: Secondary | ICD-10-CM

## 2013-10-02 NOTE — Progress Notes (Signed)
Patient given Prevnar, did not need OV.

## 2013-10-28 ENCOUNTER — Other Ambulatory Visit: Payer: Self-pay | Admitting: Physician Assistant

## 2013-10-31 ENCOUNTER — Other Ambulatory Visit: Payer: Self-pay | Admitting: Physician Assistant

## 2013-11-29 ENCOUNTER — Other Ambulatory Visit: Payer: Self-pay | Admitting: Emergency Medicine

## 2013-12-24 ENCOUNTER — Other Ambulatory Visit: Payer: Self-pay | Admitting: Emergency Medicine

## 2013-12-25 ENCOUNTER — Other Ambulatory Visit: Payer: Self-pay | Admitting: Physician Assistant

## 2013-12-25 DIAGNOSIS — G4700 Insomnia, unspecified: Secondary | ICD-10-CM

## 2013-12-25 DIAGNOSIS — F411 Generalized anxiety disorder: Secondary | ICD-10-CM

## 2014-01-03 DIAGNOSIS — R7309 Other abnormal glucose: Secondary | ICD-10-CM | POA: Insufficient documentation

## 2014-01-03 DIAGNOSIS — Z79899 Other long term (current) drug therapy: Secondary | ICD-10-CM | POA: Insufficient documentation

## 2014-01-04 NOTE — Patient Instructions (Signed)

## 2014-01-04 NOTE — Progress Notes (Signed)
Patient ID: Kelsey Lopez, female   DOB: 12-Sep-1940, 73 y.o.   MRN: [id number]   This very nice 73 y.o.WWF presents for 3 month follow up with Hypertension, Hyperlipidemia, Pre-Diabetes and Vitamin D Deficiency.    Patient is treated for HTN & BP has been controlled at home. Today's BP: 104/72 mmHg. Patient has had no complaints of any cardiac type chest pain, palpitations, dyspnea/orthopnea/PND, dizziness, claudication, or dependent edema.   Hyperlipidemia is controlled with diet & meds. Patient denies myalgias or other med SE's. Last Lipids were near goal -  Total Chol 203*; HDL 54; LDL 113*; Trig 179 on 08/01/2013.   Also, the patient has Morbid Obesity (BMI 30.13) and is monitored and screened for PreDiabetes and has had no symptoms of reactive hypoglycemia, diabetic polys, paresthesias or visual blurring.  Last A1c was 5.8% in June 2015.   Further, the patient also has history of Vitamin D Deficiency and supplements vitamin D without any suspected side-effects. Last vitamin D was  83 on  08/01/2013.   Medication List   aspirin 81 MG EC tablet     cetirizine 10 MG tablet  Take 10 mg by mouth as needed.     citalopram 40 MG tablet  TAKE ONE TABLET BY MOUTH ONE TIME DAILY     [facility] CRANBERRY 475 MG Caps     fish oil-omega-3 fatty acids 1000 MG capsule  Take 2 g by mouth daily.     Flax Seed Oil 1000 MG Caps     Glucosamine 500 MG Caps     hydrochlorothiazide 12.5 MG tablet  Take 12.5 mg by mouth daily.     hydrochlorothiazide 12.5 MG capsule  Take 1 capsule once or twice daily     LORazepam 2 MG tablet  TAKE 1/2 TO 1 TABLET BY MOUTH NIGHTLY AT BEDTIME     Magnesium 400 MG Caps     montelukast 10 MG tablet  Take one tablet by mouth one time daily     multivitamin tablet  Take 1 tablet by mouth daily.     Calcium 1800 mg 1 tablet daily     quinapril 40 MG tablet  TAKE ONE TABLET BY MOUTH ONE TIME DAILY     simvastatin 40 MG tablet  TAKE ONE TABLET BY MOUTH AT BEDTIME.     Vitamin D3 2000 UNITS capsule  Take 2,000 Units by mouth daily.     Allergies  Allergen Reactions  . Ampicillin     rash  . Mevacor [Lovastatin]     Elevated LFT's  . Penicillins     REACTION: hives and  itching  . Sulfa Antibiotics     rash   PMHx:   Past Medical History  Diagnosis Date  . Dyslipidemia     on Rx x20 years  . HTN (hypertension)   . Multiple allergies   . PVC (premature ventricular contraction)   . Hyperlipidemia   . Anxiety   . Insomnia   . Morton's neuroma of left foot   . Osteoarthritis   . Polycythemia    Immunization History  Administered Date(s) Administered  . Influenza Split 11/15/2011, 11/28/2012, 10/31/2013  . Pneumococcal Conjugate-13 10/02/2013  . Tdap 08/01/2013  . Zoster 05/25/2011   Past Surgical History  Procedure Laterality Date  . Tonsillectomy    . Vesicovaginal fistula closure w/ tah    . Knee arthroscopy      R  . Abdominal hysterectomy     FHx:    Reviewed /  unchanged  SHx:    Reviewed / unchanged  Systems Review:  Constitutional: Denies fever, chills, wt changes, headaches, insomnia, fatigue, night sweats, change in appetite. Eyes: Denies redness, blurred vision, diplopia, discharge, itchy, watery eyes.  ENT: Denies discharge, congestion, post nasal drip, epistaxis, sore throat, earache, hearing loss, dental pain, tinnitus, vertigo, sinus pain, snoring.  CV: Denies chest pain, palpitations, irregular heartbeat, syncope, dyspnea, diaphoresis, orthopnea, PND, claudication or edema. Respiratory: denies cough, dyspnea, DOE, pleurisy, hoarseness, laryngitis, wheezing.  Gastrointestinal: Denies dysphagia, odynophagia, heartburn, reflux, water brash, abdominal pain or cramps, nausea, vomiting, bloating, diarrhea, constipation, hematemesis, melena, hematochezia  or hemorrhoids. Genitourinary: Denies dysuria, frequency, urgency, nocturia, hesitancy, discharge, hematuria or flank pain. Musculoskeletal: Denies arthralgias,  myalgias, stiffness, jt. swelling, pain, limping or strain/sprain.  Skin: Denies pruritus, rash, hives, warts, acne, eczema or change in skin lesion(s). Neuro: No weakness, tremor, incoordination, spasms, paresthesia or pain. Psychiatric: Denies confusion, memory loss or sensory loss. Endo: Denies change in weight, skin or hair change.  Heme/Lymph: No excessive bleeding, bruising or enlarged lymph nodes.  Exam:  BP 104/72   Pulse 64  Temp 97.9 F   Resp 16  Ht 5' 4.5"   Wt 178 lb 3.2 oz   BMI 30.13  Appears well nourished and in no distress. Eyes: PERRLA, EOMs, conjunctiva no swelling or erythema. Sinuses: No frontal/maxillary tenderness ENT/Mouth: EAC's clear, TM's nl w/o erythema, bulging. Nares clear w/o erythema, swelling, exudates. Oropharynx clear without erythema or exudates. Oral hygiene is good. Tongue normal, non obstructing. Hearing intact.  Neck: Supple. Thyroid nl. Car 2+/2+ without bruits, nodes or JVD. Chest: Respirations nl with BS clear & equal w/o rales, rhonchi, wheezing or stridor.  Cor: Heart sounds normal w/ regular rate and rhythm without sig. murmurs, gallops, clicks, or rubs. Peripheral pulses normal and equal  without edema.  Abdomen: Soft & bowel sounds normal. Non-tender w/o guarding, rebound, hernias, masses, or organomegaly.  Lymphatics: Unremarkable.  Musculoskeletal: Full ROM all peripheral extremities, joint stability, 5/5 strength, and normal gait.  Skin: Warm, dry without exposed rashes, lesions or ecchymosis apparent.  Neuro: Cranial nerves intact, reflexes equal bilaterally. Sensory-motor testing grossly intact. Tendon reflexes grossly intact.  Pysch: Alert & oriented x 3.  Insight and judgement nl & appropriate. No ideations.  Assessment and Plan:  1. Hypertension - Continue monitor blood pressure at home. Continue diet/meds same.  2. Hyperlipidemia - Continue diet/meds, exercise,& lifestyle modifications. Continue monitor periodic  cholesterol/liver & renal functions   3. Pre-Diabetes - Continue diet, exercise, lifestyle modifications. Monitor appropriate labs.  4. Vitamin D Deficiency - Continue supplementation.  5. Morbid Obesity (BMI 30.13)   Recommended regular exercise, BP monitoring, weight control, and discussed med and SE's. Recommended labs to assess and monitor clinical status. Further disposition pending results of labs.

## 2014-01-07 ENCOUNTER — Ambulatory Visit (INDEPENDENT_AMBULATORY_CARE_PROVIDER_SITE_OTHER): Payer: Medicare Other | Admitting: Internal Medicine

## 2014-01-07 ENCOUNTER — Encounter: Payer: Self-pay | Admitting: Internal Medicine

## 2014-01-07 VITALS — BP 104/72 | HR 64 | Temp 97.9°F | Resp 16 | Ht 64.5 in | Wt 178.2 lb

## 2014-01-07 DIAGNOSIS — E559 Vitamin D deficiency, unspecified: Secondary | ICD-10-CM | POA: Insufficient documentation

## 2014-01-07 DIAGNOSIS — E782 Mixed hyperlipidemia: Secondary | ICD-10-CM

## 2014-01-07 DIAGNOSIS — R7303 Prediabetes: Secondary | ICD-10-CM

## 2014-01-07 DIAGNOSIS — I10 Essential (primary) hypertension: Secondary | ICD-10-CM

## 2014-01-07 DIAGNOSIS — Z79899 Other long term (current) drug therapy: Secondary | ICD-10-CM

## 2014-01-07 DIAGNOSIS — R7309 Other abnormal glucose: Secondary | ICD-10-CM

## 2014-01-07 LAB — CBC WITH DIFFERENTIAL/PLATELET
Basophils Absolute: 0 10*3/uL (ref 0.0–0.1)
Basophils Relative: 0 % (ref 0–1)
Eosinophils Absolute: 0.3 10*3/uL (ref 0.0–0.7)
Eosinophils Relative: 5 % (ref 0–5)
HCT: 42.9 % (ref 36.0–46.0)
Hemoglobin: 14.9 g/dL (ref 12.0–15.0)
Lymphocytes Relative: 27 % (ref 12–46)
Lymphs Abs: 1.9 10*3/uL (ref 0.7–4.0)
MCH: 31.8 pg (ref 26.0–34.0)
MCHC: 34.7 g/dL (ref 30.0–36.0)
MCV: 91.7 fL (ref 78.0–100.0)
MPV: 9.6 fL (ref 9.4–12.4)
Monocytes Absolute: 0.6 10*3/uL (ref 0.1–1.0)
Monocytes Relative: 8 % (ref 3–12)
Neutro Abs: 4.1 10*3/uL (ref 1.7–7.7)
Neutrophils Relative %: 60 % (ref 43–77)
Platelets: 242 10*3/uL (ref 150–400)
RBC: 4.68 MIL/uL (ref 3.87–5.11)
RDW: 13.8 % (ref 11.5–15.5)
WBC: 6.9 10*3/uL (ref 4.0–10.5)

## 2014-01-08 LAB — HEPATIC FUNCTION PANEL
ALT: 26 U/L (ref 0–35)
AST: 23 U/L (ref 0–37)
Albumin: 4 g/dL (ref 3.5–5.2)
Alkaline Phosphatase: 68 U/L (ref 39–117)
Bilirubin, Direct: 0.1 mg/dL (ref 0.0–0.3)
Indirect Bilirubin: 0.7 mg/dL (ref 0.2–1.2)
Total Bilirubin: 0.8 mg/dL (ref 0.2–1.2)
Total Protein: 6.1 g/dL (ref 6.0–8.3)

## 2014-01-08 LAB — BASIC METABOLIC PANEL WITH GFR
BUN: 8 mg/dL (ref 6–23)
CO2: 28 mEq/L (ref 19–32)
Calcium: 9.4 mg/dL (ref 8.4–10.5)
Chloride: 97 mEq/L (ref 96–112)
Creat: 0.48 mg/dL — ABNORMAL LOW (ref 0.50–1.10)
GFR, Est African American: >89 mL/min
GFR, Est Non African American: >89 mL/min
Glucose, Bld: 99 mg/dL (ref 70–99)
Potassium: 3.6 mEq/L (ref 3.5–5.3)
Sodium: 137 mEq/L (ref 135–145)

## 2014-01-08 LAB — HEMOGLOBIN A1C
Hgb A1c MFr Bld: 5.4 % (ref <5.7)
Mean Plasma Glucose: 108 mg/dL (ref <117)

## 2014-01-08 LAB — LIPID PANEL
Cholesterol: 178 mg/dL (ref 0–200)
HDL: 51 mg/dL (ref >39)
LDL Cholesterol: 98 mg/dL (ref 0–99)
Total CHOL/HDL Ratio: 3.5 Ratio
Triglycerides: 145 mg/dL (ref <150)
VLDL: 29 mg/dL (ref 0–40)

## 2014-01-08 LAB — TSH: TSH: 2.22 u[IU]/mL (ref 0.350–4.500)

## 2014-01-08 LAB — INSULIN, FASTING: Insulin fasting, serum: 6.9 u[IU]/mL (ref 2.0–19.6)

## 2014-01-08 LAB — MAGNESIUM: Magnesium: 1.7 mg/dL (ref 1.5–2.5)

## 2014-01-08 LAB — VITAMIN D 25 HYDROXY (VIT D DEFICIENCY, FRACTURES): Vit D, 25-Hydroxy: 66 ng/mL (ref 30–100)

## 2014-01-15 ENCOUNTER — Telehealth: Payer: Self-pay | Admitting: *Deleted

## 2014-01-15 ENCOUNTER — Telehealth: Payer: Self-pay

## 2014-01-15 NOTE — Telephone Encounter (Signed)
Left message for patient to return call for lab results. 

## 2014-01-15 NOTE — Telephone Encounter (Signed)
Pt aware of lab results 

## 2014-01-15 NOTE — Telephone Encounter (Signed)
-----   Message from Unk Pinto, MD sent at 01/08/2014  8:17 AM EST ----- - CBC Kidneys Liver Mag Thyroid - all Nl/OK - Chol 178 -HDL 51 _ LDL 98 - Great - keep up great work - A1c & insulin WNL - No Diabetes - Dit D 66 -great - Keep all meds same

## 2014-01-16 ENCOUNTER — Telehealth: Payer: Self-pay

## 2014-01-16 NOTE — Telephone Encounter (Signed)
-----   Message from Unk Pinto, MD sent at 01/08/2014  8:17 AM EST ----- - CBC Kidneys Liver Mag Thyroid - all Nl/OK - Chol 178 -HDL 51 _ LDL 98 - Great - keep up great work - A1c & insulin WNL - No Diabetes - Dit D 66 -great - Keep all meds same

## 2014-01-16 NOTE — Telephone Encounter (Signed)
Left message for patient to return call for lab results. 

## 2014-02-09 ENCOUNTER — Other Ambulatory Visit: Payer: Self-pay | Admitting: Internal Medicine

## 2014-02-18 ENCOUNTER — Other Ambulatory Visit: Payer: Self-pay | Admitting: Internal Medicine

## 2014-02-18 ENCOUNTER — Other Ambulatory Visit: Payer: Self-pay | Admitting: Physician Assistant

## 2014-02-22 ENCOUNTER — Other Ambulatory Visit: Payer: Self-pay | Admitting: Internal Medicine

## 2014-03-07 ENCOUNTER — Other Ambulatory Visit: Payer: Self-pay | Admitting: Internal Medicine

## 2014-03-17 ENCOUNTER — Other Ambulatory Visit: Payer: Self-pay | Admitting: Internal Medicine

## 2014-03-18 ENCOUNTER — Other Ambulatory Visit: Payer: Self-pay | Admitting: *Deleted

## 2014-03-18 DIAGNOSIS — F411 Generalized anxiety disorder: Secondary | ICD-10-CM

## 2014-03-18 MED ORDER — TRAZODONE HCL 150 MG PO TABS: ORAL_TABLET | ORAL | Status: DC

## 2014-03-18 MED ORDER — ALPRAZOLAM 0.5 MG PO TABS: ORAL_TABLET | ORAL | Status: DC

## 2014-03-19 ENCOUNTER — Telehealth: Payer: Self-pay | Admitting: *Deleted

## 2014-03-19 NOTE — Telephone Encounter (Signed)
OK to take Trazodone and Citalopram Per Dr Melford Aase.

## 2014-03-19 NOTE — Telephone Encounter (Signed)
[facility]ist called to verify OK to give patient Citalopram and Trazodone at the same time due to possible reaction.  OK to dispense med pr Dr Melford Aase.

## 2014-04-10 ENCOUNTER — Encounter: Payer: Self-pay | Admitting: Physician Assistant

## 2014-04-10 ENCOUNTER — Ambulatory Visit (INDEPENDENT_AMBULATORY_CARE_PROVIDER_SITE_OTHER): Payer: Medicare Other | Admitting: Physician Assistant

## 2014-04-10 VITALS — BP 128/78 | HR 72 | Temp 97.9°F | Resp 16 | Ht 64.5 in | Wt 179.0 lb

## 2014-04-10 DIAGNOSIS — R6889 Other general symptoms and signs: Secondary | ICD-10-CM | POA: Diagnosis not present

## 2014-04-10 DIAGNOSIS — Z79899 Other long term (current) drug therapy: Secondary | ICD-10-CM | POA: Diagnosis not present

## 2014-04-10 DIAGNOSIS — D45 Polycythemia vera: Secondary | ICD-10-CM | POA: Diagnosis not present

## 2014-04-10 DIAGNOSIS — Z1331 Encounter for screening for depression: Secondary | ICD-10-CM

## 2014-04-10 DIAGNOSIS — J449 Chronic obstructive pulmonary disease, unspecified: Secondary | ICD-10-CM

## 2014-04-10 DIAGNOSIS — Z0001 Encounter for general adult medical examination with abnormal findings: Secondary | ICD-10-CM | POA: Diagnosis not present

## 2014-04-10 DIAGNOSIS — E559 Vitamin D deficiency, unspecified: Secondary | ICD-10-CM | POA: Diagnosis not present

## 2014-04-10 DIAGNOSIS — I10 Essential (primary) hypertension: Secondary | ICD-10-CM | POA: Diagnosis not present

## 2014-04-10 DIAGNOSIS — F419 Anxiety disorder, unspecified: Secondary | ICD-10-CM

## 2014-04-10 DIAGNOSIS — R059 Cough, unspecified: Secondary | ICD-10-CM

## 2014-04-10 DIAGNOSIS — R05 Cough: Secondary | ICD-10-CM

## 2014-04-10 DIAGNOSIS — E663 Overweight: Secondary | ICD-10-CM | POA: Insufficient documentation

## 2014-04-10 DIAGNOSIS — R7303 Prediabetes: Secondary | ICD-10-CM

## 2014-04-10 DIAGNOSIS — E782 Mixed hyperlipidemia: Secondary | ICD-10-CM | POA: Diagnosis not present

## 2014-04-10 DIAGNOSIS — G5762 Lesion of plantar nerve, left lower limb: Secondary | ICD-10-CM

## 2014-04-10 DIAGNOSIS — E669 Obesity, unspecified: Secondary | ICD-10-CM

## 2014-04-10 DIAGNOSIS — G4700 Insomnia, unspecified: Secondary | ICD-10-CM | POA: Insufficient documentation

## 2014-04-10 DIAGNOSIS — M1990 Unspecified osteoarthritis, unspecified site: Secondary | ICD-10-CM

## 2014-04-10 DIAGNOSIS — Z9181 History of falling: Secondary | ICD-10-CM

## 2014-04-10 LAB — CBC WITH DIFFERENTIAL/PLATELET
Basophils Absolute: 0 10*3/uL (ref 0.0–0.1)
Basophils Relative: 0 % (ref 0–1)
Eosinophils Absolute: 0.3 10*3/uL (ref 0.0–0.7)
Eosinophils Relative: 3 % (ref 0–5)
HCT: 41.1 % (ref 36.0–46.0)
Hemoglobin: 14 g/dL (ref 12.0–15.0)
Lymphocytes Relative: 15 % (ref 12–46)
Lymphs Abs: 1.5 10*3/uL (ref 0.7–4.0)
MCH: 31.9 pg (ref 26.0–34.0)
MCHC: 34.1 g/dL (ref 30.0–36.0)
MCV: 93.6 fL (ref 78.0–100.0)
MPV: 9.6 fL (ref 8.6–12.4)
Monocytes Absolute: 0.8 10*3/uL (ref 0.1–1.0)
Monocytes Relative: 8 % (ref 3–12)
Neutro Abs: 7.4 10*3/uL (ref 1.7–7.7)
Neutrophils Relative %: 74 % (ref 43–77)
Platelets: 249 10*3/uL (ref 150–400)
RBC: 4.39 MIL/uL (ref 3.87–5.11)
RDW: 13.3 % (ref 11.5–15.5)
WBC: 10 10*3/uL (ref 4.0–10.5)

## 2014-04-10 LAB — TSH: TSH: 1.211 u[IU]/mL (ref 0.350–4.500)

## 2014-04-10 MED ORDER — PREDNISONE 20 MG PO TABS
ORAL_TABLET | ORAL | Status: DC
Start: 2014-04-10 — End: 2014-08-21

## 2014-04-10 MED ORDER — AZITHROMYCIN 250 MG PO TABS: ORAL_TABLET | ORAL | Status: DC

## 2014-04-10 MED ORDER — CITALOPRAM HYDROBROMIDE 40 MG PO TABS: 40.0000 mg | ORAL_TABLET | Freq: Every day | ORAL | Status: DC

## 2014-04-10 NOTE — Patient Instructions (Addendum)
Sinusitis can be uncomfortable. People with sinusitis have congestion with yellow/green/gray discharge, sinus pain/pressure, pain around the eyes. Sinus infections almost ALWAYS stem from a viral infection and antibiotics don't work against a virus. Even when bacteria is responsible, the infections usually clear up on their own in a week or so.   PLEASE TRY TO DO OVER THE COUNTER TREATMENT AND PREDNISONE FOR 5-7 DAYS AND IF YOU ARE NOT GETTING BETTER OR GETTING WORSE THEN YOU CAN START ON AN ANTIBIOTIC GIVEN.  Can take the prednisone AT NIGHT WITH DINNER, it take 8-12 hours to start working so it will NOT affect your sleeping if you take it at night with your food!! Take two pills the first night and 1 or two pill the second night and then 1 pill the other nights.   Risk of antibiotic use: About 1 in 4 people who take antibiotics have side effects including stomach problems, dizziness, or rashes. Those problems clear up soon after stopping the drugs, but in rare cases antibiotics can cause severe allergic reaction. Over use of antibiotics also encourages the growth of bacteria that can't be controlled easily with drugs. That makes you more vunerable to antibiotic-resistant infections and undermines the benefits of antibiotics for others.   Waste of Money: Antibiotics often aren't very expensive, but any money spent on unnecessary drugs is money down the drain.   When are antibiotics needed? Only when symptoms last longer than a week.  Start to improve but then worsen again  -It can take up to 2 weeks to feel better.   -If you do not get better in 7-10 days (Have fever, facial pain, dental pain and swelling), then please call the office and it is now appropriate to start an antibiotic.   -Please take Tylenol or Ibuprofen for pain. -Acetaminiphen 325mg orally every 4-6 hours for pain.  Max: 10 per day -Ibuprofen 200mg orally every 6-8 hours for pain.  Take with food to avoid ulcers.   Max 10 per  day  Please pick one of the over the counter allergy medications below and take it once daily for allergies.  Claritin or loratadine cheapest but likely the weakest  Zyrtec or certizine at night because it can make you sleepy The strongest is allegra or fexafinadine  Cheapest at [employer], [employer], [employer]  -While drinking fluids, pinch and hold nose close and swallow.  This will help open up your eustachian tubes to drain the fluid behind your ear drums. -Try steam showers to open your nasal passages.   Drink lots of water to stay hydrated and to thin mucous.  Flonase/Nasonex is to help the inflammation.  Take 2 sprays in each nostril at bedtime.  Make sure you spray towards the outside of each nostril towards the outer corner of your eye, hold nose close and tilt head back.  This will help the medication get into your sinuses.  If you do not like this medication, then use saline nasal sprays same directions as above for Flonase. Stop the medication right away if you get blurring of your vision or nose bleeds.  Sinusitis Sinusitis is redness, soreness, and inflammation of the paranasal sinuses. Paranasal sinuses are air pockets within the bones of your face (beneath the eyes, the middle of the forehead, or above the eyes). In healthy paranasal sinuses, mucus is able to drain out, and air is able to circulate through them by way of your nose. However, when your paranasal sinuses are inflamed, mucus and air can   become trapped. This can allow bacteria and other germs to grow and cause infection. Sinusitis can develop quickly and last only a short time (acute) or continue over a long period (chronic). Sinusitis that lasts for more than 12 weeks is considered chronic.  CAUSES  Causes of sinusitis include: Allergies. Structural abnormalities, such as displacement of the cartilage that separates your nostrils (deviated septum), which can decrease the air flow through your nose and sinuses and affect sinus  drainage. Functional abnormalities, such as when the small hairs (cilia) that line your sinuses and help remove mucus do not work properly or are not present. SIGNS AND SYMPTOMS  Symptoms of acute and chronic sinusitis are the same. The primary symptoms are pain and pressure around the affected sinuses. Other symptoms include: Upper toothache. Earache. Headache. Bad breath. Decreased sense of smell and taste. A cough, which worsens when you are lying flat. Fatigue. Fever. Thick drainage from your nose, which often is green and may contain pus (purulent). Swelling and warmth over the affected sinuses. DIAGNOSIS  Your health care provider will perform a physical exam. During the exam, your health care provider may: Look in your nose for signs of abnormal growths in your nostrils (nasal polyps).  Tap over the affected sinus to check for signs of infection. View the inside of your sinuses (endoscopy) using an imaging device that has a light attached (endoscope). If your health care provider suspects that you have chronic sinusitis, one or more of the following tests may be recommended: Allergy tests. Nasal culture. A sample of mucus is taken from your nose, sent to a lab, and screened for bacteria. Nasal cytology. A sample of mucus is taken from your nose and examined by your health care provider to determine if your sinusitis is related to an allergy. TREATMENT  Most cases of acute sinusitis are related to a viral infection and will resolve on their own within 10 days. Sometimes medicines are prescribed to help relieve symptoms (pain medicine, decongestants, nasal steroid sprays, or saline sprays).  However, for sinusitis related to a bacterial infection, your health care provider will prescribe antibiotic medicines. These are medicines that will help kill the bacteria causing the infection.  Rarely, sinusitis is caused by a fungal infection. In theses cases, your health care provider will  prescribe antifungal medicine. For some cases of chronic sinusitis, surgery is needed. Generally, these are cases in which sinusitis recurs more than 3 times per year, despite other treatments. HOME CARE INSTRUCTIONS  Drink plenty of water. Water helps thin the mucus so your sinuses can drain more easily. Use a humidifier. Inhale steam 3 to 4 times a day (for example, sit in the bathroom with the shower running). Apply a warm, moist washcloth to your face 3 to 4 times a day, or as directed by your health care provider. Use saline nasal sprays to help moisten and clean your sinuses. Take medicines only as directed by your health care provider. If you were prescribed either an antibiotic or antifungal medicine, finish it all even if you start to feel better. SEEK IMMEDIATE MEDICAL CARE IF: You have increasing pain or severe headaches. You have nausea, vomiting, or drowsiness. You have swelling around your face. You have vision problems. You have a stiff neck. You have difficulty breathing. MAKE SURE YOU:  Understand these instructions. Will watch your condition. Will get help right away if you are not doing well or get worse. Document Released: 01/25/2005 Document Revised: 06/11/2013 Document Reviewed: 02/09/2011 [employer]   Patient Information 2015 [place]. This information is not intended to replace advice given to you by your health care provider. Make sure you discuss any questions you have with your health care provider.  Before you even begin to attack a weight-loss plan, it pays to remember this: You are not fat. You have fat. Losing weight isn't about blame or shame; it's simply another achievement to accomplish. Dieting is like any other skill-you have to buckle down and work at it. As long as you act in a smart, reasonable way, you'll ultimately get where you want to be. Here are some weight loss pearls for you.  1. It's Not a Diet. It's a Lifestyle Thinking of a diet as  something you're on and suffering through only for the short term doesn't work. To shed weight and keep it off, you need to make permanent changes to the way you eat. It's OK to indulge occasionally, of course, but if you cut calories temporarily and then revert to your old way of eating, you'll gain back the weight quicker than you can say yo-yo. Use it to lose it. Research shows that one of the best predictors of long-term weight loss is how many pounds you drop in the first month. For that reason, nutritionists often suggest being stricter for the first two weeks of your new eating strategy to build momentum. Cut out added sugar and alcohol and avoid unrefined carbs. After that, figure out how you can reincorporate them in a way that's healthy and maintainable.  2. There's a Right Way to Exercise Working out burns calories and fat and boosts your metabolism by building muscle. But those trying to lose weight are notorious for overestimating the number of calories they burn and underestimating the amount they take in. Unfortunately, your system is biologically programmed to hold on to extra pounds and that means when you start exercising, your body senses the deficit and ramps up its hunger signals. If you're not diligent, you'll eat everything you burn and then some. Use it to lose it. Cardio gets all the exercise glory, but strength and interval training are the real heroes. They help you build lean muscle, which in turn increases your metabolism and calorie-burning ability 3. Don't Overreact to Mild Hunger Some people have a hard time losing weight because of hunger anxiety. To them, being hungry is bad-something to be avoided at all costs-so they carry snacks with them and eat when they don't need to. Others eat because they're stressed out or bored. While you never want to get to the point of being ravenous (that's when bingeing is likely to happen), a hunger pang, a craving, or the fact that it's 3:00  p.m. should not send you racing for the vending machine or obsessing about the energy bar in your purse. Ideally, you should put off eating until your stomach is growling and it's difficult to concentrate.  Use it to lose it. When you feel the urge to eat, use the HALT method. Ask yourself, Am I really hungry? Or am I angry or anxious, lonely or bored, or tired? If you're still not certain, try the apple test. If you're truly hungry, an apple should seem delicious; if it doesn't, something else is going on. Or you can try drinking water and making yourself busy, if you are still hungry try a healthy snack.  4. Not All Calories Are Created Equal The mechanics of weight loss are pretty simple: Take in fewer calories than you  use for energy. But the kind of food you eat makes all the difference. Processed food that's high in saturated fat and refined starch or sugar can cause inflammation that disrupts the hormone signals that tell your brain you're full. The result: You eat a lot more.  Use it to lose it. Clean up your diet. Swap in whole, unprocessed foods, including vegetables, lean protein, and healthy fats that will fill you up and give you the biggest nutritional bang for your calorie buck. In a few weeks, as your brain starts receiving regular hunger and fullness signals once again, you'll notice that you feel less hungry overall and naturally start cutting back on the amount you eat.  5. Protein, Produce, and Plant-Based Fats Are Your Weight-Loss Trinity Here's why eating the three Ps regularly will help you drop pounds. Protein fills you up. You need it to build lean muscle, which keeps your metabolism humming so that you can torch more fat. People in a weight-loss program who ate double the recommended daily allowance for protein (about 110 grams for a 150-pound woman) lost 70 percent of their weight from fat, while people who ate the RDA lost only about 40 percent, one study found. Produce is packed  with filling fiber. "It's very difficult to consume too many calories if you're eating a lot of vegetables. Example: Three cups of broccoli is a lot of food, yet only 93 calories. (Fruit is another story. It can be easy to overeat and can contain a lot of calories from sugar, so be sure to monitor your intake.) Plant-based fats like olive oil and those in avocados and nuts are healthy and extra satiating.  Use it to lose it. Aim to incorporate each of the three Ps into every meal and snack. People who eat protein throughout the day are able to keep weight off, according to a study in the [place] of Clinical Nutrition. In addition to meat, poultry and seafood, good sources are beans, lentils, eggs, tofu, and yogurt. As for fat, keep portion sizes in check by measuring out salad dressing, oil, and nut butters (shoot for one to two tablespoons). Finally, eat veggies or a little fruit at every meal. People who did that consumed 308 fewer calories but didn't feel any hungrier than when they didn't eat more produce.  7. How You Eat Is As Important As What You Eat In order for your brain to register that you're full, you need to focus on what you're eating. Sit down whenever you eat, preferably at a table. Turn off the TV or computer, put down your phone, and look at your food. Smell it. Chew slowly, and don't put another bite on your fork until you swallow. When women ate lunch this attentively, they consumed 30 percent less when snacking later than those who listened to an audiobook at lunchtime, according to a study in the [place] of Nutrition. 8. Weighing Yourself Really Works The scale provides the best evidence about whether your efforts are paying off. Seeing the numbers tick up or down or stagnate is motivation to keep going-or to rethink your approach. A 2015 study at [facility] found that daily weigh-ins helped people lose more weight, keep it off, and maintain that loss, even  after two years. Use it to lose it. Step on the scale at the same time every day for the best results. If your weight shoots up several pounds from one weigh-in to the next, don't freak out. Eating a  lot of salt the night before or having your period is the likely culprit. The number should return to normal in a day or two. It's a steady climb that you need to do something about. 9. Too Much Stress and Too Little Sleep Are Your Enemies When you're tired and frazzled, your body cranks up the production of cortisol, the stress hormone that can cause carb cravings. Not getting enough sleep also boosts your levels of ghrelin, a hormone associated with hunger, while suppressing leptin, a hormone that signals fullness and satiety. People on a diet who slept only five and a half hours a night for two weeks lost 55 percent less fat and were hungrier than those who slept eight and a half hours, according to a study in the [place]. Use it to lose it. Prioritize sleep, aiming for seven hours or more a night, which research shows helps lower stress. And make sure you're getting quality zzz's. If a snoring spouse or a fidgety cat wakes you up frequently throughout the night, you may end up getting the equivalent of just four hours of sleep, according to a study from [facility]. Keep pets out of the bedroom, and use a white-noise app to drown out snoring. 10. You Will Hit a plateau-And You Can Bust Through It As you slim down, your body releases much less leptin, the fullness hormone.  If you're not strength training, start right now. Building muscle can raise your metabolism to help you overcome a plateau. To keep your body challenged and burning calories, incorporate new moves and more intense intervals into your workouts or add another sweat session to your weekly routine. Alternatively, cut an extra 100 calories or so a day from your diet. Now that you've lost weight, your body  simply doesn't need as much fuel.

## 2014-04-10 NOTE — Progress Notes (Signed)
Patient ID: Kelsey Lopez, female   DOB: [birth year], 74 y.o.   MRN: [id number] MEDICARE ANNUAL WELLNESS VISIT AND 3 month  Assessment:   1. Essential hypertension - continue medications, DASH diet, exercise and monitor at home. Call if greater than 130/80.  - BASIC METABOLIC PANEL WITH GFR - Hepatic function panel - TSH  2. Prediabetes Discussed general issues about diabetes pathophysiology and management., Educational material distributed., Suggested low cholesterol diet., Encouraged aerobic exercise., Discussed foot care., Reminded to get yearly retinal exam.  3. Polycythemia vera - CBC with Differential/Platelet  4. Mixed hyperlipidemia -continue medications, check lipids, decrease fatty foods, increase activity.  - Lipid panel  5. Anxiety Anxiety- continue medications, stress management techniques discussed, increase water, good sleep hygiene discussed, increase exercise, and increase veggies.  - citalopram (CELEXA) 40 MG tablet; Take 1 tablet (40 mg total) by mouth daily.  Dispense: 90 tablet; Refill: 1  6. Medication management - Magnesium  7. Vitamin D deficiency - Vit D  25 hydroxy (rtn osteoporosis monitoring)  8. Osteoarthritis, unspecified osteoarthritis type, unspecified site RICE, NSAIDS, exercises given, if not better get xray and PT referral or ortho referral.   9. Morton's neuroma of left foot remission  10. Obesity Obesity with co morbidities- long discussion about weight loss, diet, and exercise  11. COPD (chronic obstructive pulmonary disease) with chronic bronchitis will get CXR, continue meds.   12. Insomnia Insomnia- good sleep hygiene discussed, increase day time activity, try melatonin or benadryl if this does not help we will call in sleep medication.   13. Cough - azithromycin (ZITHROMAX) 250 MG tablet; 2 tablets by mouth today then one tablet daily for 4 days.  Dispense: 6 tablet; Refill: 1 - predniSONE (DELTASONE) 20 MG tablet; 2 tablets  daily for 3 days, 1 tablet daily for 4 days.  Dispense: 10 tablet; Refill: 0   Plan:   During the course of the visit the patient was educated and counseled about appropriate screening and preventive services including:    Pneumococcal vaccine   Influenza vaccine  Td vaccine  Screening electrocardiogram  Screening mammography  Bone densitometry screening  Colorectal cancer screening  Diabetes screening  Glaucoma screening  Nutrition counseling   Advanced directives: given information/requested  Screening recommendations, referrals: ALL FOLLOWING UP TO DATE OR DECLINES  Vaccinations: See below and orders  Nutrition assessed and recommended  Colonoscopy declined- up to date Mammogram ordered due in 3 months Pap smear not indicated Pelvic exam not indicated Recommended yearly ophthalmology/optometry visit for glaucoma screening and checkup Recommended yearly dental visit for hygiene and checkup Advanced directives - not indicated  Conditions/risks identified: BMI: Discussed weight loss, diet, and increase physical activity.  Increase physical activity: AHA recommends 150 minutes of physical activity a week.  Medications reviewed DEXA- not indicated/up to date PreDiabetes at goal, ACE/ARB therapy No, Reason not on Ace Inhibitor/ARB therapy:  preDM Urinary Incontinence is not an issue: discussed non pharmacology and pharmacology options.  Fall risk: low- discussed PT, home fall assessment, medications.   Subjective:   Kelsey Lopez is a 74 y.o. female who presents for Medicare Annual Wellness Visit and 3 month follow up for HTN, chol, and preDM.    Date of last medicare wellness visit was 08/01/2013  Her blood pressure has been controlled at home, today their BP is BP: 128/78 mmHg She does workout. She denies chest pain, shortness of breath, dizziness.  She is on cholesterol medication and denies myalgias. Her cholesterol is not at goal.  The cholesterol last  visit was:   Lab Results  Component Value Date   CHOL 178 01/07/2014   HDL 51 01/07/2014   LDLCALC 98 01/07/2014   TRIG 145 01/07/2014   CHOLHDL 3.5 01/07/2014   Last A1C in the office was:  Lab Results  Component Value Date   HGBA1C 5.4 01/07/2014  Patient is on Vitamin D supplement.   He has COPD due to being former smoker and doing well however she has had 1 week of sinus pressure, sinus congestion, and ear congestion, cough without production and some wheezing. .  She has a history of polycythemia vera and is being monitored Lab Results  Component Value Date   WBC 6.9 01/07/2014   HGB 14.9 01/07/2014   HCT 42.9 01/07/2014   MCV 91.7 01/07/2014   PLT 242 01/07/2014  She is on celexa for anxiety, started after her husband passed 5 years ago and think it is still helping, she is on xanax PRN and she is on trazodone. She takes care of her handicap brother and this is extra stress/anxiety.  BMI is Body mass index is 30.26 kg/(m^2)., she is working on diet and exercise. Wt Readings from Last 3 Encounters:  04/10/14 179 lb (81.194 kg)  01/07/14 178 lb 3.2 oz (80.831 kg)  10/02/13 175 lb (79.379 kg)    Names of Other Physician/Practitioners you currently use: Patient Care Team: Unk Pinto, MD as PCP - General (Internal Medicine) Simona Huh, MD as Consulting Physician (Dermatology) Jeryl Columbia, MD as Consulting Physician (Gastroenterology) Fay Records, MD as Consulting Physician (Cardiology) Orvan Seen, [facility]) Tye Savoy, (Dentist)  Medication Review Current Outpatient Prescriptions on File Prior to Visit  Medication Sig Dispense Refill  . ALPRAZolam (XANAX) 0.5 MG tablet take 1/2 to 1 tablet 3 times a day 90 tablet 2  . aspirin 81 MG EC tablet Take 81 mg by mouth daily.      . cetirizine (ZYRTEC) 10 MG tablet Take 10 mg by mouth as needed.      . Cholecalciferol (VITAMIN D3) 2000 UNITS capsule Take 2,000 Units by mouth daily.      . citalopram (CELEXA) 40  MG tablet TAKE ONE TABLET BY MOUTH ONE TIME DAILY  90 tablet 1  . Cranberry ([employer] CRANBERRY) 475 MG CAPS Take 1 capsule by mouth daily.      . fish oil-omega-3 fatty acids 1000 MG capsule Take 2 g by mouth daily.      . Flaxseed, Linseed, (FLAX SEED OIL) 1000 MG CAPS Take 1,000 mg by mouth daily.    Marland Kitchen FLUZONE HIGH-DOSE 0.5 ML SUSY     . Glucosamine 500 MG CAPS Take by mouth daily.    . hydrochlorothiazide (MICROZIDE) 12.5 MG capsule TAKE ONE CAPSULE BY MOUTH one to two times daily  90 capsule 1  . Magnesium 400 MG CAPS Take 400 mg by mouth daily.    . montelukast (SINGULAIR) 10 MG tablet TAKE ONE TABLET BY MOUTH ONE TIME DAILY  30 tablet 3  . Multiple Vitamin (MULTIVITAMIN) tablet Take 1 tablet by mouth daily.      . NON FORMULARY Calcium 1800 mg 1 tablet daily     . OVER THE COUNTER MEDICATION Otc Mucinex 1 tab daily    . quinapril (ACCUPRIL) 40 MG tablet TAKE ONE TABLET BY MOUTH ONE TIME DAILY  90 tablet 1  . simvastatin (ZOCOR) 40 MG tablet TAKE ONE TABLET BY MOUTH AT BEDTIME  90 tablet 0  . simvastatin (  ZOCOR) 40 MG tablet TAKE ONE TABLET BY MOUTH AT BEDTIME  90 tablet 0  . traZODone (DESYREL) 150 MG tablet Takes 1/2 to 1 tab 1 hour prior to bedtime. 30 tablet 0   No current facility-administered medications on file prior to visit.   Allergies  Allergen Reactions  . Ampicillin     rash  . Mevacor [Lovastatin]     Elevated LFT's  . Penicillins     REACTION: hives and  itching  . Sulfa Antibiotics     rash     Current Problems (verified) Patient Active Problem List   Diagnosis Date Noted  . Vitamin D deficiency 01/07/2014  . Prediabetes 01/03/2014  . Medication management 01/03/2014  . Anxiety   . Morton's neuroma of left foot   . Osteoarthritis   . Polycythemia vera 06/05/2009  . Mixed hyperlipidemia 06/05/2009  . Essential hypertension 06/05/2009    Immunization History  Administered Date(s) Administered  . Influenza Split 11/15/2011, 11/28/2012, 10/31/2013  .  Pneumococcal Conjugate-13 10/02/2013  . Tdap 08/01/2013  . Zoster 05/25/2011    Preventative care: Last colonoscopy: 05/2012 Last mammogram: 07/19/13 Last pap smear/pelvic exam: 2007  DEXA:07/19/13 osteopenia ECHO: 07/14/09  Prior vaccinations: TDAP: 2015  Influenza: 2014  Pneumococcal: 2004 Prevnar 13: 2015 Shingles/Zostavax: 2013   Past Surgical History  Procedure Laterality Date  . Tonsillectomy    . Vesicovaginal fistula closure w/ tah    . Knee arthroscopy      R  . Abdominal hysterectomy      Family History  Problem Relation Age of Onset  . Osteoarthritis Mother   . Heart attack Mother   . Heart disease Mother   . Cancer Father     lung  . Osteoarthritis Father   . Hyperlipidemia Brother   . Diabetes Brother     Risk Factors: Osteoporosis: postmenopausal estrogen deficiency History of fracture in the past year: no  Tobacco History  Substance Use Topics  . Smoking status: Never Smoker   . Smokeless tobacco: Not on file     Comment: no tobacco   . Alcohol Use: Yes     Comment: rare   She does not smoke.  Patient is a former smoker. Are there smokers in your home (other than you)?  Yes  Alcohol Current alcohol use: none  Caffeine Current caffeine use: coffee 1 /day  Exercise  Current exercise: gardening and walking  Nutrition/Diet Current diet: in general, a "healthy" diet    Cardiac risk factors: advanced age (older than 13 for men, 49 for women), dyslipidemia and hypertension.  Depression Screen (Note: if answer to either of the following is "Yes", a more complete depression screening is indicated)   Q1: Over the past two weeks, have you felt down, depressed or hopeless? No  Q2: Over the past two weeks, have you felt little interest or pleasure in doing things? No  Have you lost interest or pleasure in daily life? No  Do you often feel hopeless? No  Do you cry easily over simple problems? No  Activities of Daily Living In your present  state of health, do you have any difficulty performing the following activities?:  Driving? No Managing money?  No Feeding yourself? No Getting from bed to chair? No Climbing a flight of stairs? No Preparing food and eating?: No Bathing or showering? No Getting dressed: No Getting to the toilet? No Using the toilet:No Moving around from place to place: No In the past year have you fallen or  had a near fall?:No   Are you sexually active?  No  Do you have more than one partner?  No  Vision Difficulties: No  Hearing Difficulties: No Do you often ask people to speak up or repeat themselves? No Do you experience ringing or noises in your ears? No Do you have difficulty understanding soft or whispered voices? No  Cognition  Do you feel that you have a problem with memory?No  Do you often misplace items? No  Do you feel safe at home?  No  Advanced directives Does patient have a Health Care Power of Attorney? Yes Does patient have a Living Will? Yes   Objective:     Blood pressure 128/78, pulse 72, temperature 97.9 F (36.6 C), resp. rate 16, height 5' 4.5" (1.638 m), weight 179 lb (81.194 kg). Body mass index is 30.26 kg/(m^2).  General appearance: alert, no distress, WD/WN,  female Cognitive Testing  Alert? Yes  Normal Appearance?Yes  Oriented to person? Yes  Place? Yes   Time? Yes  Recall of three objects?  Yes  Can perform simple calculations? Yes  Displays appropriate judgment?Yes  Can read the correct time from a watch face?Yes  HEENT: normocephalic, sclerae anicteric, TMs pearly, nares patent, no discharge or erythema, pharynx normal Oral cavity: MMM, no lesions Neck: supple, no lymphadenopathy, no thyromegaly, no masses Heart: RRR, normal S1, S2, no murmurs Lungs: CTA bilaterally, no wheezes, rhonchi, or rales Abdomen: +bs, soft, non tender, non distended, no masses, no hepatomegaly, no splenomegaly Musculoskeletal: nontender, no swelling, no obvious  deformity Extremities: no edema, no cyanosis, no clubbing Pulses: 2+ symmetric, upper and lower extremities, normal cap refill Neurological: alert, oriented x 3, CN2-12 intact, strength normal upper extremities and lower extremities, sensation normal throughout, DTRs 2+ throughout, no cerebellar signs, gait normal Skin: WNL Psychiatric: normal affect, behavior normal, pleasant  Breast:  defer Rectal: defer   Medicare Attestation I have personally reviewed: The patient's medical and social history Their use of alcohol, tobacco or illicit drugs Their current medications and supplements The patient's functional ability including ADLs,fall risks, home safety risks, cognitive, and hearing and visual impairment Diet and physical activities Evidence for depression or mood disorders  The patient's weight, height, BMI, and visual acuity have been recorded in the chart.  I have made referrals, counseling, and provided education to the patient based on review of the above and I have provided the patient with a written personalized care plan for preventive services.     Vicie Mutters, PA-C   04/10/2014

## 2014-04-11 LAB — BASIC METABOLIC PANEL WITH GFR
BUN: 7 mg/dL (ref 6–23)
CO2: 30 mEq/L (ref 19–32)
Calcium: 9.6 mg/dL (ref 8.4–10.5)
Chloride: 94 mEq/L — ABNORMAL LOW (ref 96–112)
Creat: 0.47 mg/dL — ABNORMAL LOW (ref 0.50–1.10)
GFR, Est African American: >89 mL/min
GFR, Est Non African American: >89 mL/min
Glucose, Bld: 85 mg/dL (ref 70–99)
Potassium: 3.6 mEq/L (ref 3.5–5.3)
Sodium: 134 mEq/L — ABNORMAL LOW (ref 135–145)

## 2014-04-11 LAB — HEPATIC FUNCTION PANEL
ALT: 21 U/L (ref 0–35)
AST: 22 U/L (ref 0–37)
Albumin: 4 g/dL (ref 3.5–5.2)
Alkaline Phosphatase: 72 U/L (ref 39–117)
Bilirubin, Direct: 0.1 mg/dL (ref 0.0–0.3)
Indirect Bilirubin: 0.6 mg/dL (ref 0.2–1.2)
Total Bilirubin: 0.7 mg/dL (ref 0.2–1.2)
Total Protein: 6 g/dL (ref 6.0–8.3)

## 2014-04-11 LAB — MAGNESIUM: Magnesium: 1.7 mg/dL (ref 1.5–2.5)

## 2014-04-11 LAB — LIPID PANEL
Cholesterol: 175 mg/dL (ref 0–200)
HDL: 52 mg/dL (ref >=46)
LDL Cholesterol: 102 mg/dL — ABNORMAL HIGH (ref 0–99)
Total CHOL/HDL Ratio: 3.4 Ratio
Triglycerides: 105 mg/dL (ref <150)
VLDL: 21 mg/dL (ref 0–40)

## 2014-04-11 LAB — VITAMIN D 25 HYDROXY (VIT D DEFICIENCY, FRACTURES): Vit D, 25-Hydroxy: 69 ng/mL (ref 30–100)

## 2014-04-16 ENCOUNTER — Other Ambulatory Visit: Payer: Self-pay | Admitting: Internal Medicine

## 2014-05-21 ENCOUNTER — Other Ambulatory Visit: Payer: Self-pay | Admitting: Internal Medicine

## 2014-06-11 ENCOUNTER — Encounter: Payer: Self-pay | Admitting: Emergency Medicine

## 2014-06-13 ENCOUNTER — Other Ambulatory Visit: Payer: Self-pay | Admitting: Internal Medicine

## 2014-06-13 ENCOUNTER — Encounter: Payer: Self-pay | Admitting: Internal Medicine

## 2014-06-17 DIAGNOSIS — D225 Melanocytic nevi of trunk: Secondary | ICD-10-CM | POA: Diagnosis not present

## 2014-06-17 DIAGNOSIS — L814 Other melanin hyperpigmentation: Secondary | ICD-10-CM | POA: Diagnosis not present

## 2014-06-17 DIAGNOSIS — L821 Other seborrheic keratosis: Secondary | ICD-10-CM | POA: Diagnosis not present

## 2014-07-04 ENCOUNTER — Other Ambulatory Visit: Payer: Self-pay | Admitting: Internal Medicine

## 2014-08-07 ENCOUNTER — Other Ambulatory Visit: Payer: Self-pay | Admitting: Physician Assistant

## 2014-08-08 ENCOUNTER — Encounter: Payer: Self-pay | Admitting: Emergency Medicine

## 2014-08-21 ENCOUNTER — Encounter: Payer: Self-pay | Admitting: Physician Assistant

## 2014-08-21 ENCOUNTER — Ambulatory Visit (HOSPITAL_COMMUNITY)
Admission: RE | Admit: 2014-08-21 | Discharge: 2014-08-21 | Disposition: A | Discharge status: Home / Self Care | Payer: Medicare Other | Source: Ambulatory Visit | Attending: Physician Assistant | Admitting: Physician Assistant

## 2014-08-21 ENCOUNTER — Ambulatory Visit (INDEPENDENT_AMBULATORY_CARE_PROVIDER_SITE_OTHER): Payer: Medicare Other | Admitting: Physician Assistant

## 2014-08-21 VITALS — BP 130/78 | HR 68 | Temp 97.7°F | Resp 16 | Ht 64.5 in | Wt 176.0 lb

## 2014-08-21 DIAGNOSIS — Z79899 Other long term (current) drug therapy: Secondary | ICD-10-CM

## 2014-08-21 DIAGNOSIS — Z6829 Body mass index (BMI) 29.0-29.9, adult: Secondary | ICD-10-CM

## 2014-08-21 DIAGNOSIS — Z0000 Encounter for general adult medical examination without abnormal findings: Secondary | ICD-10-CM

## 2014-08-21 DIAGNOSIS — E782 Mixed hyperlipidemia: Secondary | ICD-10-CM

## 2014-08-21 DIAGNOSIS — R300 Dysuria: Secondary | ICD-10-CM | POA: Diagnosis not present

## 2014-08-21 DIAGNOSIS — R059 Cough, unspecified: Secondary | ICD-10-CM

## 2014-08-21 DIAGNOSIS — R05 Cough: Secondary | ICD-10-CM | POA: Diagnosis not present

## 2014-08-21 DIAGNOSIS — F419 Anxiety disorder, unspecified: Secondary | ICD-10-CM

## 2014-08-21 DIAGNOSIS — E559 Vitamin D deficiency, unspecified: Secondary | ICD-10-CM | POA: Diagnosis not present

## 2014-08-21 DIAGNOSIS — J449 Chronic obstructive pulmonary disease, unspecified: Secondary | ICD-10-CM | POA: Insufficient documentation

## 2014-08-21 DIAGNOSIS — R7309 Other abnormal glucose: Secondary | ICD-10-CM | POA: Diagnosis not present

## 2014-08-21 DIAGNOSIS — D45 Polycythemia vera: Secondary | ICD-10-CM | POA: Diagnosis not present

## 2014-08-21 DIAGNOSIS — Z1331 Encounter for screening for depression: Secondary | ICD-10-CM

## 2014-08-21 DIAGNOSIS — E669 Obesity, unspecified: Secondary | ICD-10-CM

## 2014-08-21 DIAGNOSIS — Z9181 History of falling: Secondary | ICD-10-CM

## 2014-08-21 DIAGNOSIS — I10 Essential (primary) hypertension: Secondary | ICD-10-CM | POA: Diagnosis not present

## 2014-08-21 DIAGNOSIS — G5762 Lesion of plantar nerve, left lower limb: Secondary | ICD-10-CM

## 2014-08-21 DIAGNOSIS — R7303 Prediabetes: Secondary | ICD-10-CM

## 2014-08-21 DIAGNOSIS — J4489 Other specified chronic obstructive pulmonary disease: Secondary | ICD-10-CM

## 2014-08-21 DIAGNOSIS — M1990 Unspecified osteoarthritis, unspecified site: Secondary | ICD-10-CM

## 2014-08-21 DIAGNOSIS — G4700 Insomnia, unspecified: Secondary | ICD-10-CM

## 2014-08-21 LAB — CBC WITH DIFFERENTIAL/PLATELET
Basophils Absolute: 0 10*3/uL (ref 0.0–0.1)
Basophils Relative: 0 % (ref 0–1)
Eosinophils Absolute: 0.2 10*3/uL (ref 0.0–0.7)
Eosinophils Relative: 3 % (ref 0–5)
HCT: 43.6 % (ref 36.0–46.0)
Hemoglobin: 14.4 g/dL (ref 12.0–15.0)
Lymphocytes Relative: 25 % (ref 12–46)
Lymphs Abs: 1.7 10*3/uL (ref 0.7–4.0)
MCH: 31.4 pg (ref 26.0–34.0)
MCHC: 33 g/dL (ref 30.0–36.0)
MCV: 95 fL (ref 78.0–100.0)
MPV: 9.9 fL (ref 8.6–12.4)
Monocytes Absolute: 0.5 10*3/uL (ref 0.1–1.0)
Monocytes Relative: 7 % (ref 3–12)
Neutro Abs: 4.5 10*3/uL (ref 1.7–7.7)
Neutrophils Relative %: 65 % (ref 43–77)
Platelets: 222 10*3/uL (ref 150–400)
RBC: 4.59 MIL/uL (ref 3.87–5.11)
RDW: 13.4 % (ref 11.5–15.5)
WBC: 6.9 10*3/uL (ref 4.0–10.5)

## 2014-08-21 LAB — HEMOGLOBIN A1C
Hgb A1c MFr Bld: 5.5 % (ref <5.7)
Mean Plasma Glucose: 111 mg/dL (ref <117)

## 2014-08-21 MED ORDER — VALSARTAN 320 MG PO TABS: 320.0000 mg | ORAL_TABLET | Freq: Every day | ORAL | Status: DC

## 2014-08-21 NOTE — Progress Notes (Signed)
Complete Physical  Assessment and Plan: 1. Essential hypertension - continue medications, DASH diet, exercise and monitor at home. Call if greater than 130/80.  - CBC with Differential/Platelet - BASIC METABOLIC PANEL WITH GFR - Hepatic function panel - TSH - Urinalysis, Routine w reflex microscopic (not at [facility]) - Microalbumin / creatinine urine ratio - EKG 12-Lead  2. Prediabetes Discussed general issues about diabetes pathophysiology and management., Educational material distributed., Suggested low cholesterol diet., Encouraged aerobic exercise., Discussed foot care., Reminded to get yearly retinal exam. - Insulin, fasting - Hemoglobin A1c - HM DIABETES FOOT EXAM  3. Mixed hyperlipidemia -continue medications, check lipids, decrease fatty foods, increase activity.  - Lipid panel  4. Obesity Obesity with co morbidities- long discussion about weight loss, diet, and exercise  5. Vitamin D deficiency - Vit D  25 hydroxy (rtn osteoporosis monitoring)  6. Medication management - Magnesium  7. COPD (chronic obstructive pulmonary disease) with chronic bronchitis - DG Chest 2 View; Future  8. Polycythemia vera - Iron and TIBC - Ferritin -CBC  9. Morton's neuroma of left foot remission  10. Osteoarthritis, unspecified osteoarthritis type, unspecified site controlled  11. Anxiety continue medications, stress management techniques discussed, increase water, good sleep hygiene discussed, increase exercise, and increase veggies.   12. Insomnia Controlled, Insomnia- good sleep hygiene discussed, increase day time activity  13. Routine general medical examination at a health care facility uptodate on all vaccines  14. At low risk for fall Low risk  15. Screening for depression negative  16. Cough Likely ACE induced, given samples of benicar 20 to try for 7 days and then RX of diovan 320, can take 1/2, monitor BP, will check CXR due to COPD history however no CP,  SOB - DG Chest 2 View; Future  17. Dysuria Versus vaginal atrophy - Urine culture  Discussed med's effects and SE's. Screening labs and tests as requested with regular follow-up as recommended. Over 40 minutes of exam, counseling, chart review, and complex, high level critical decision making was performed this visit.   HPI  74 y.o. female  presents for a complete physical.  Her blood pressure has been controlled at home, today their BP is BP: 130/78 mmHg She does workout. She denies chest pain, shortness of breath, dizziness.  She is on cholesterol medication and denies myalgias. Her cholesterol is not at goal. The cholesterol last visit was:   Lab Results  Component Value Date   CHOL 175 04/10/2014   HDL 52 04/10/2014   LDLCALC 102* 04/10/2014   TRIG 105 04/10/2014   CHOLHDL 3.4 04/10/2014   She has been working on diet and exercise for prediabetes,  Last A1C in the office was:  Lab Results  Component Value Date   HGBA1C 5.4 01/07/2014   Patient is on Vitamin D supplement.   Lab Results  Component Value Date   VD25OH 11 04/10/2014     She has COPD due to smoke exposure/2nd hand smoke with husband, sisters/brothers, family, has had cough since feb, was treated with zpak and prednisone without relief, she is on ACE. Last CXR years ago.  She has a history of polycythemia vera and is being monitored Lab Results  Component Value Date   WBC 10.0 04/10/2014   HGB 14.0 04/10/2014   HCT 41.1 04/10/2014   MCV 93.6 04/10/2014   PLT 249 04/10/2014   She is on celexa for anxiety, started after her husband passed 5 years ago and think it is still helping, she is  on xanax PRN and she is on trazodone. She takes care of her handicap brother and this is extra stress/anxiety.  She has had some dysuria, on azo OTC and has been 1-2 weeks.  BMI is Body mass index is .Body mass index is 29.75 kg/(m^2)., she is working on diet and exercise. Wt Readings from Last 3 Encounters:  08/21/14 176  lb (79.833 kg)  04/10/14 179 lb (81.194 kg)  01/07/14 178 lb 3.2 oz (80.831 kg)    Current Medications:  Current Outpatient Prescriptions on File Prior to Visit  Medication Sig Dispense Refill  . ALPRAZolam (XANAX) 0.5 MG tablet TAKE ONE-HALF TO ONE TABLET BY MOUTH THREE TIMES DAILY AS NEEDED 90 tablet 2  . aspirin 81 MG EC tablet Take 81 mg by mouth daily.      Marland Kitchen azithromycin (ZITHROMAX) 250 MG tablet 2 tablets by mouth today then one tablet daily for 4 days. 6 tablet 1  . cetirizine (ZYRTEC) 10 MG tablet Take 10 mg by mouth as needed.      . Cholecalciferol (VITAMIN D3) 2000 UNITS capsule Take 2,000 Units by mouth daily.      . citalopram (CELEXA) 40 MG tablet Take 1 tablet (40 mg total) by mouth daily. 90 tablet 1  . Cranberry ([employer] CRANBERRY) 475 MG CAPS Take 1 capsule by mouth daily.      . fish oil-omega-3 fatty acids 1000 MG capsule Take 2 g by mouth daily.      . Flaxseed, Linseed, (FLAX SEED OIL) 1000 MG CAPS Take 1,000 mg by mouth daily.    Marland Kitchen FLUZONE HIGH-DOSE 0.5 ML SUSY     . Glucosamine 500 MG CAPS Take by mouth daily.    . hydrochlorothiazide (MICROZIDE) 12.5 MG capsule TAKE ONE CAPSULE BY MOUTH ONE TO TWO TIMES DAILY 90 capsule 3  . Magnesium 400 MG CAPS Take 400 mg by mouth daily.    . montelukast (SINGULAIR) 10 MG tablet TAKE ONE TABLET BY MOUTH ONE TIME DAILY 30 tablet 3  . Multiple Vitamin (MULTIVITAMIN) tablet Take 1 tablet by mouth daily.      . NON FORMULARY Calcium 1800 mg 1 tablet daily     . OVER THE COUNTER MEDICATION Otc Mucinex 1 tab daily    . predniSONE (DELTASONE) 20 MG tablet 2 tablets daily for 3 days, 1 tablet daily for 4 days. 10 tablet 0  . quinapril (ACCUPRIL) 40 MG tablet TAKE ONE TABLET BY MOUTH ONE TIME DAILY 90 tablet 1  . simvastatin (ZOCOR) 40 MG tablet TAKE ONE TABLET BY MOUTH AT BEDTIME  90 tablet 0  . traZODone (DESYREL) 150 MG tablet TAKE ONE-HALF TO ONE TABLET BY MOUTH ONE HOUR PRIOR TO BEDTIME 30 tablet 0   No current facility-administered  medications on file prior to visit.   Health Maintenance:   Immunization History  Administered Date(s) Administered  . Influenza Split 11/15/2011, 11/28/2012, 10/31/2013  . Pneumococcal Conjugate-13 10/02/2013  . Tdap 08/01/2013  . Zoster 05/25/2011   Last colonoscopy: 05/2012 Last mammogram: 07/19/13 getting this month Last pap smear/pelvic exam: 2007 declines another DEXA:07/19/13 osteopenia due 2017 ECHO: 07/14/09 CXR Due  Prior vaccinations: TDAP: 2015 Influenza: 2014 Pneumococcal: 2004 Prevnar 13: 2015 Shingles/Zostavax: 2013  Last Dental Exam: Dr. Tye Savoy q 6 months Last Eye Exam:Dr. Orvan Seen, wears glasses, 08/13/2014 Patient Care Team: Unk Pinto, MD as PCP - General (Internal Medicine) Druscilla Brownie, MD as Consulting Physician (Dermatology) Clarene Essex, MD as Consulting Physician (Gastroenterology) Fay Records, MD as Consulting Physician (  Cardiology)  Allergies:  Allergies  Allergen Reactions  . Ampicillin     rash  . Mevacor [Lovastatin]     Elevated LFT's  . Penicillins     REACTION: hives and  itching  . Sulfa Antibiotics     rash   Medical History:  Past Medical History  Diagnosis Date  . Dyslipidemia     on Rx x20 years  . HTN (hypertension)   . Multiple allergies   . PVC (premature ventricular contraction)   . Hyperlipidemia   . Anxiety   . Insomnia   . Morton's neuroma of left foot   . Osteoarthritis   . Polycythemia    Surgical History:  Past Surgical History  Procedure Laterality Date  . Tonsillectomy    . Vesicovaginal fistula closure w/ tah    . Knee arthroscopy      R  . Abdominal hysterectomy     Family History:  Family History  Problem Relation Age of Onset  . Osteoarthritis Mother   . Heart attack Mother   . Heart disease Mother   . Cancer Father     lung  . Osteoarthritis Father   . Hyperlipidemia Brother   . Diabetes Brother    Social History:  History  Substance Use Topics  . Smoking  status: Never Smoker   . Smokeless tobacco: Not on file     Comment: no tobacco   . Alcohol Use: Yes     Comment: rare    Review of Systems: Review of Systems  Constitutional: Negative.   HENT: Negative.   Eyes: Negative.   Respiratory: Positive for cough (nonproductive). Negative for hemoptysis, sputum production, shortness of breath and wheezing.   Cardiovascular: Positive for palpitations (very rare, lasts seconds, no accompaniments. ). Negative for chest pain, orthopnea, claudication and PND.  Gastrointestinal: Negative.   Genitourinary: Positive for dysuria. Negative for urgency, frequency, hematuria and flank pain.       Stress incontinence occ  Musculoskeletal: Positive for joint pain (right knee pain occ). Negative for myalgias, back pain, falls and neck pain.  Skin: Negative.  Negative for rash.  Psychiatric/Behavioral: Negative.  Negative for depression, suicidal ideas and memory loss. The patient is not nervous/anxious and does not have insomnia.    Physical Exam: Estimated body mass index is 29.75 kg/(m^2) as calculated from the following:   Height as of this encounter: 5' 4.5" (1.638 m).   Weight as of this encounter: 176 lb (79.833 kg). BP 130/78 mmHg  Pulse 68  Temp(Src) 97.7 F (36.5 C)  Resp 16  Ht 5' 4.5" (1.638 m)  Wt 176 lb (79.833 kg)  BMI 29.75 kg/m2 General Appearance: Well nourished, in no apparent distress.  Eyes: PERRLA, EOMs, conjunctiva no swelling or erythema, normal fundi and vessels.  Sinuses: No Frontal/maxillary tenderness  ENT/Mouth: Ext aud canals clear, normal light reflex with TMs without erythema, bulging. Good dentition. No erythema, swelling, or exudate on post pharynx. Tonsils not swollen or erythematous. Hearing normal.  Neck: Supple, thyroid normal. No bruits  Respiratory: Respiratory effort normal, BS equal bilaterally without rales, rhonchi, wheezing or stridor.  Cardio: RRR without murmurs, rubs or gallops. Brisk peripheral pulses  without edema.  Chest: symmetric, with normal excursions and percussion.  Breasts: Symmetric, without lumps, nipple discharge, retractions.  Abdomen: Soft, nontender, no guarding, rebound, hernias, masses, or organomegaly.  Lymphatics: Non tender without lymphadenopathy.  Genitourinary: defer Musculoskeletal: Full ROM all peripheral extremities,5/5 strength, and normal gait.  Skin: Warm, dry without rashes,  lesions, ecchymosis. Neuro: Cranial nerves intact, reflexes equal bilaterally. Normal muscle tone, no cerebellar symptoms. Sensation intact.  Psych: Awake and oriented X 3, normal affect, Insight and Judgment appropriate.   EKG: WNL, sinus brady, non specific T wave inversion in III, V3-V5, no significant ST changes. AORTA SCAN:  defer  Vicie Mutters 10:11 AM [facility]

## 2014-08-21 NOTE — Patient Instructions (Addendum)
Will get chest xray Will stop the quinapril and take samples of benicar 20 mg one pill daily and if you do well with it fill the diovan 320 and take 1/2 pill and monitor BP  Monitor your blood pressure at home, if it is above 140/90 consistently call the office so we can adjust your medications. Go to the ER if any CP, SOB, nausea, dizziness, severe HA, changes vision/speech  Tumeric with black pepper extract is a great natural antiinflammatory that helps with arthritis and aches and pain. Can get from [employer], [employer] or any health food store. Need to take at least 823m twice a day with food.    DASH Eating Plan DASH stands for "Dietary Approaches to Stop Hypertension." The DASH eating plan is a healthy eating plan that has been shown to reduce high blood pressure (hypertension). Additional health benefits may include reducing the risk of type 2 diabetes mellitus, heart disease, and stroke. The DASH eating plan may also help with weight loss. WHAT DO I NEED TO KNOW ABOUT THE DASH EATING PLAN? For the DASH eating plan, you will follow these general guidelines:  Choose foods with a percent daily value for sodium of less than 5% (as listed on the food label).  Use salt-free seasonings or herbs instead of table salt or sea salt.  Check with your health care provider or pharmacist before using salt substitutes.  Eat lower-sodium products, often labeled as "lower sodium" or "no salt added."  Eat fresh foods.  Eat more vegetables, fruits, and low-fat dairy products.  Choose whole grains. Look for the word "whole" as the first word in the ingredient list.  Choose fish and skinless chicken or [place] often than red meat. Limit fish, poultry, and meat to 6 oz (170 g) each day.  Limit sweets, desserts, sugars, and sugary drinks.  Choose heart-healthy fats.  Limit cheese to 1 oz (28 g) per day.  Eat more home-cooked food and less restaurant, buffet, and fast food.  Limit fried  foods.  Cook foods using methods other than frying.  Limit canned vegetables. If you do use them, rinse them well to decrease the sodium.  When eating at a restaurant, ask that your food be prepared with less salt, or no salt if possible. WHAT FOODS CAN I EAT? Seek help from a dietitian for individual calorie needs. Grains Whole grain or whole wheat bread. Brown rice. Whole grain or whole wheat pasta. Quinoa, bulgur, and whole grain cereals. Low-sodium cereals. Corn or whole wheat flour tortillas. Whole grain cornbread. Whole grain crackers. Low-sodium crackers. Vegetables Fresh or frozen vegetables (raw, steamed, roasted, or grilled). Low-sodium or reduced-sodium tomato and vegetable juices. Low-sodium or reduced-sodium tomato sauce and paste. Low-sodium or reduced-sodium canned vegetables.  Fruits All fresh, canned (in natural juice), or frozen fruits. Meat and Other Protein Products Ground beef (85% or leaner), grass-fed beef, or beef trimmed of fat. Skinless chicken or t[place] Ground chicken or t[place] Pork trimmed of fat. All fish and seafood. Eggs. Dried beans, peas, or lentils. Unsalted nuts and seeds. Unsalted canned beans. Dairy Low-fat dairy products, such as skim or 1% milk, 2% or reduced-fat cheeses, low-fat ricotta or cottage cheese, or plain low-fat yogurt. Low-sodium or reduced-sodium cheeses. Fats and Oils Tub margarines without trans fats. Light or reduced-fat mayonnaise and salad dressings (reduced sodium). Avocado. Safflower, olive, or canola oils. Natural peanut or almond butter. Other Unsalted popcorn and pretzels. The items listed above may not be a complete list of recommended  foods or beverages. Contact your dietitian for more options. WHAT FOODS ARE NOT RECOMMENDED? Grains White bread. White pasta. White rice. Refined cornbread. Bagels and croissants. Crackers that contain trans fat. Vegetables Creamed or fried vegetables. Vegetables in a cheese sauce. Regular  canned vegetables. Regular canned tomato sauce and paste. Regular tomato and vegetable juices. Fruits Dried fruits. Canned fruit in light or heavy syrup. Fruit juice. Meat and Other Protein Products Fatty cuts of meat. Ribs, chicken wings, bacon, sausage, bologna, salami, chitterlings, fatback, hot dogs, bratwurst, and packaged luncheon meats. Salted nuts and seeds. Canned beans with salt. Dairy Whole or 2% milk, cream, half-and-half, and cream cheese. Whole-fat or sweetened yogurt. Full-fat cheeses or blue cheese. Nondairy creamers and whipped toppings. Processed cheese, cheese spreads, or cheese curds. Condiments Onion and garlic salt, seasoned salt, table salt, and sea salt. Canned and packaged gravies. Worcestershire sauce. Tartar sauce. Barbecue sauce. Teriyaki sauce. Soy sauce, including reduced sodium. Steak sauce. Fish sauce. Oyster sauce. Cocktail sauce. Horseradish. Ketchup and mustard. Meat flavorings and tenderizers. Bouillon cubes. Hot sauce. Tabasco sauce. Marinades. Taco seasonings. Relishes. Fats and Oils Butter, stick margarine, lard, shortening, ghee, and bacon fat. Coconut, palm kernel, or palm oils. Regular salad dressings. Other Pickles and olives. Salted popcorn and pretzels. The items listed above may not be a complete list of foods and beverages to avoid. Contact your dietitian for more information. WHERE CAN I FIND MORE INFORMATION? National Heart, Lung, and Blood Institute: travelstabloid.com Document Released: 01/14/2011 Document Revised: 06/11/2013 Document Reviewed: 11/29/2012 [facility] Patient Information 2015 [place], [place]. This information is not intended to replace advice given to you by your health care provider. Make sure you discuss any questions you have with your health care provider.  Preventive Care for Adults A healthy lifestyle and preventive care can promote health and wellness. Preventive health guidelines for women  include the following key practices.  A routine yearly physical is a good way to check with your health care provider about your health and preventive screening. It is a chance to share any concerns and updates on your health and to receive a thorough exam.  Visit your dentist for a routine exam and preventive care every 6 months. Brush your teeth twice a day and floss once a day. Good oral hygiene prevents tooth decay and gum disease.  The frequency of eye exams is based on your age, health, family medical history, use of contact lenses, and other factors. Follow your health care provider's recommendations for frequency of eye exams.  Eat a healthy diet. Foods like vegetables, fruits, whole grains, low-fat dairy products, and lean protein foods contain the nutrients you need without too many calories. Decrease your intake of foods high in solid fats, added sugars, and salt. Eat the right amount of calories for you.Get information about a proper diet from your health care provider, if necessary.  Regular physical exercise is one of the most important things you can do for your health. Most adults should get at least 150 minutes of moderate-intensity exercise (any activity that increases your heart rate and causes you to sweat) each week. In addition, most adults need muscle-strengthening exercises on 2 or more days a week.  Maintain a healthy weight. The body mass index (BMI) is a screening tool to identify possible weight problems. It provides an estimate of body fat based on height and weight. Your health care provider can find your BMI and can help you achieve or maintain a healthy weight.For adults 20 years and older:  A BMI below 18.5 is considered underweight.  A BMI of 18.5 to 24.9 is normal.  A BMI of 25 to 29.9 is considered overweight.  A BMI of 30 and above is considered obese.  Maintain normal blood lipids and cholesterol levels by exercising and minimizing your intake of  saturated fat. Eat a balanced diet with plenty of fruit and vegetables. If your lipid or cholesterol levels are high, you are over 50, or you are at high risk for heart disease, you may need your cholesterol levels checked more frequently.Ongoing high lipid and cholesterol levels should be treated with medicines if diet and exercise are not working.  If you smoke, find out from your health care provider how to quit. If you do not use tobacco, do not start.  Lung cancer screening is recommended for adults aged 15-80 years who are at high risk for developing lung cancer because of a history of smoking. A yearly low-dose CT scan of the lungs is recommended for people who have at least a 30-pack-year history of smoking and are a current smoker or have quit within the past 15 years. A pack year of smoking is smoking an average of 1 pack of cigarettes a day for 1 year (for example: 1 pack a day for 30 years or 2 packs a day for 15 years). Yearly screening should continue until the smoker has stopped smoking for at least 15 years. Yearly screening should be stopped for people who develop a health problem that would prevent them from having lung cancer treatment.  Avoid use of street drugs. Do not share needles with anyone. Ask for help if you need support or instructions about stopping the use of drugs.  High blood pressure causes heart disease and increases the risk of stroke.  Ongoing high blood pressure should be treated with medicines if weight loss and exercise do not work.  If you are 68-32 years old, ask your health care provider if you should take aspirin to prevent strokes.  Diabetes screening involves taking a blood sample to check your fasting blood sugar level. This should be done once every 3 years, after age 69, if you are within normal weight and without risk factors for diabetes. Testing should be considered at a younger age or be carried out more frequently if you are overweight and have at  least 1 risk factor for diabetes.  Breast cancer screening is essential preventive care for women. You should practice "breast self-awareness." This means understanding the normal appearance and feel of your breasts and may include breast self-examination. Any changes detected, no matter how small, should be reported to a health care provider. Women in their 72s and 30s should have a clinical breast exam (CBE) by a health care provider as part of a regular health exam every 1 to 3 years. After age 57, women should have a CBE every year. Starting at age 80, women should consider having a mammogram (breast X-ray test) every year. Women who have a family history of breast cancer should talk to their health care provider about genetic screening. Women at a high risk of breast cancer should talk to their health care providers about having an MRI and a mammogram every year.  Breast cancer gene (BRCA)-related cancer risk assessment is recommended for women who have family members with BRCA-related cancers. BRCA-related cancers include breast, ovarian, tubal, and peritoneal cancers. Having family members with these cancers may be associated with an increased risk for harmful changes (mutations) in  the breast cancer genes BRCA1 and BRCA2. Results of the assessment will determine the need for genetic counseling and BRCA1 and BRCA2 testing.  Routine pelvic exams to screen for cancer are no longer recommended for nonpregnant women who are considered low risk for cancer of the pelvic organs (ovaries, uterus, and vagina) and who do not have symptoms. Ask your health care provider if a screening pelvic exam is right for you.  If you have had past treatment for cervical cancer or a condition that could lead to cancer, you need Pap tests and screening for cancer for at least 20 years after your treatment. If Pap tests have been discontinued, your risk factors (such as having a new sexual partner) need to be reassessed to  determine if screening should be resumed. Some women have medical problems that increase the chance of getting cervical cancer. In these cases, your health care provider may recommend more frequent screening and Pap tests.    Colorectal cancer can be detected and often prevented. Most routine colorectal cancer screening begins at the age of 72 years and continues through age 51 years. However, your health care provider may recommend screening at an earlier age if you have risk factors for colon cancer. On a yearly basis, your health care provider may provide home test kits to check for hidden blood in the stool. Use of a small camera at the end of a tube, to directly examine the colon (sigmoidoscopy or colonoscopy), can detect the earliest forms of colorectal cancer. Talk to your health care provider about this at age 47, when routine screening begins. Direct exam of the colon should be repeated every 5-10 years through age 1 years, unless early forms of pre-cancerous polyps or small growths are found.  Osteoporosis is a disease in which the bones lose minerals and strength with aging. This can result in serious bone fractures or breaks. The risk of osteoporosis can be identified using a bone density scan. Women ages 47 years and over and women at risk for fractures or osteoporosis should discuss screening with their health care providers. Ask your health care provider whether you should take a calcium supplement or vitamin D to reduce the rate of osteoporosis.  Menopause can be associated with physical symptoms and risks. Hormone replacement therapy is available to decrease symptoms and risks. You should talk to your health care provider about whether hormone replacement therapy is right for you.  Use sunscreen. Apply sunscreen liberally and repeatedly throughout the day. You should seek shade when your shadow is shorter than you. Protect yourself by wearing long sleeves, pants, a wide-brimmed hat, and  sunglasses year round, whenever you are outdoors.  Once a month, do a whole body skin exam, using a mirror to look at the skin on your back. Tell your health care provider of new moles, moles that have irregular borders, moles that are larger than a pencil eraser, or moles that have changed in shape or color.  Stay current with required vaccines (immunizations).  Influenza vaccine. All adults should be immunized every year.  Tetanus, diphtheria, and acellular pertussis (Td, Tdap) vaccine. Pregnant women should receive 1 dose of Tdap vaccine during each pregnancy. The dose should be obtained regardless of the length of time since the last dose. Immunization is preferred during the 27th-36th week of gestation. An adult who has not previously received Tdap or who does not know her vaccine status should receive 1 dose of Tdap. This initial dose should be followed by  tetanus and diphtheria toxoids (Td) booster doses every 10 years. Adults with an unknown or incomplete history of completing a 3-dose immunization series with Td-containing vaccines should begin or complete a primary immunization series including a Tdap dose. Adults should receive a Td booster every 10 years.    Zoster vaccine. One dose is recommended for adults aged 7 years or older unless certain conditions are present.    Pneumococcal 13-valent conjugate (PCV13) vaccine. When indicated, a person who is uncertain of her immunization history and has no record of immunization should receive the PCV13 vaccine. An adult aged 12 years or older who has certain medical conditions and has not been previously immunized should receive 1 dose of PCV13 vaccine. This PCV13 should be followed with a dose of pneumococcal polysaccharide (PPSV23) vaccine. The PPSV23 vaccine dose should be obtained at least 8 weeks after the dose of PCV13 vaccine. An adult aged 55 years or older who has certain medical conditions and previously received 1 or more doses of  PPSV23 vaccine should receive 1 dose of PCV13. The PCV13 vaccine dose should be obtained 1 or more years after the last PPSV23 vaccine dose.    Pneumococcal polysaccharide (PPSV23) vaccine. When PCV13 is also indicated, PCV13 should be obtained first. All adults aged 28 years and older should be immunized. An adult younger than age 40 years who has certain medical conditions should be immunized. Any person who resides in a nursing home or long-term care facility should be immunized. An adult smoker should be immunized. People with an immunocompromised condition and certain other conditions should receive both PCV13 and PPSV23 vaccines. People with human immunodeficiency virus (HIV) infection should be immunized as soon as possible after diagnosis. Immunization during chemotherapy or radiation therapy should be avoided. Routine use of PPSV23 vaccine is not recommended for American Indians, [place] Natives, or people younger than 65 years unless there are medical conditions that require PPSV23 vaccine. When indicated, people who have unknown immunization and have no record of immunization should receive PPSV23 vaccine. One-time revaccination 5 years after the first dose of PPSV23 is recommended for people aged 19-64 years who have chronic kidney failure, nephrotic syndrome, asplenia, or immunocompromised conditions. People who received 1-2 doses of PPSV23 before age 64 years should receive another dose of PPSV23 vaccine at age 68 years or later if at least 5 years have passed since the previous dose. Doses of PPSV23 are not needed for people immunized with PPSV23 at or after age 44 years.   Preventive Services / Frequency  Ages 69 years and over  Blood pressure check.  Lipid and cholesterol check.  Lung cancer screening. / Every year if you are aged 88-80 years and have a 30-pack-year history of smoking and currently smoke or have quit within the past 15 years. Yearly screening is stopped once you have  quit smoking for at least 15 years or develop a health problem that would prevent you from having lung cancer treatment.  Clinical breast exam.** / Every year after age 39 years.  BRCA-related cancer risk assessment.** / For women who have family members with a BRCA-related cancer (breast, ovarian, tubal, or peritoneal cancers).  Mammogram.** / Every year beginning at age 77 years and continuing for as long as you are in good health. Consult with your health care provider.  Pap test.** / Every 3 years starting at age 28 years through age 77 or 61 years with 3 consecutive normal Pap tests. Testing can be stopped between 65 and  70 years with 3 consecutive normal Pap tests and no abnormal Pap or HPV tests in the past 10 years.  Fecal occult blood test (FOBT) of stool. / Every year beginning at age 49 years and continuing until age 13 years. You may not need to do this test if you get a colonoscopy every 10 years.  Flexible sigmoidoscopy or colonoscopy.** / Every 5 years for a flexible sigmoidoscopy or every 10 years for a colonoscopy beginning at age 66 years and continuing until age 31 years.  Hepatitis C blood test.** / For all people born from [birth year] through 1965 and any individual with known risks for hepatitis C.  Osteoporosis screening.** / A one-time screening for women ages 56 years and over and women at risk for fractures or osteoporosis.  Skin self-exam. / Monthly.  Influenza vaccine. / Every year.  Tetanus, diphtheria, and acellular pertussis (Tdap/Td) vaccine.** / 1 dose of Td every 10 years.  Zoster vaccine.** / 1 dose for adults aged 66 years or older.  Pneumococcal 13-valent conjugate (PCV13) vaccine.** / Consult your health care provider.  Pneumococcal polysaccharide (PPSV23) vaccine.** / 1 dose for all adults aged 64 years and older. Screening for abdominal aortic aneurysm (AAA)  by ultrasound is recommended for people who have history of high blood pressure or who are  current or former smokers.

## 2014-08-22 ENCOUNTER — Other Ambulatory Visit: Payer: Self-pay | Admitting: Internal Medicine

## 2014-08-22 ENCOUNTER — Encounter: Payer: Self-pay | Admitting: Physician Assistant

## 2014-08-22 LAB — URINALYSIS, ROUTINE W REFLEX MICROSCOPIC
Bilirubin Urine: NEGATIVE
Glucose, UA: NEGATIVE mg/dL
Ketones, ur: NEGATIVE mg/dL
Nitrite: NEGATIVE
Protein, ur: 30 mg/dL — AB
Specific Gravity, Urine: 1.01 (ref 1.005–1.030)
Urobilinogen, UA: 0.2 mg/dL (ref 0.0–1.0)
pH: 7.5 (ref 5.0–8.0)

## 2014-08-22 LAB — MICROALBUMIN / CREATININE URINE RATIO
Creatinine, Urine: 53.2 mg/dL
Microalb Creat Ratio: 562 mg/g — ABNORMAL HIGH (ref 0.0–30.0)
Microalb, Ur: 29.9 mg/dL — ABNORMAL HIGH (ref <2.0)

## 2014-08-22 LAB — BASIC METABOLIC PANEL WITH GFR
BUN: 7 mg/dL (ref 6–23)
CO2: 28 mEq/L (ref 19–32)
Calcium: 9.8 mg/dL (ref 8.4–10.5)
Chloride: 97 mEq/L (ref 96–112)
Creat: 0.52 mg/dL (ref 0.50–1.10)
GFR, Est African American: >89 mL/min
GFR, Est Non African American: >89 mL/min
Glucose, Bld: 83 mg/dL (ref 70–99)
Potassium: 3.5 mEq/L (ref 3.5–5.3)
Sodium: 140 mEq/L (ref 135–145)

## 2014-08-22 LAB — VITAMIN D 25 HYDROXY (VIT D DEFICIENCY, FRACTURES): Vit D, 25-Hydroxy: 55 ng/mL (ref 30–100)

## 2014-08-22 LAB — HEPATIC FUNCTION PANEL
ALT: 18 U/L (ref 0–35)
AST: 21 U/L (ref 0–37)
Albumin: 4.1 g/dL (ref 3.5–5.2)
Alkaline Phosphatase: 64 U/L (ref 39–117)
Bilirubin, Direct: 0.2 mg/dL (ref 0.0–0.3)
Indirect Bilirubin: 0.6 mg/dL (ref 0.2–1.2)
Total Bilirubin: 0.8 mg/dL (ref 0.2–1.2)
Total Protein: 6.2 g/dL (ref 6.0–8.3)

## 2014-08-22 LAB — IRON AND TIBC
%SAT: 44 % (ref 20–55)
Iron: 146 ug/dL — ABNORMAL HIGH (ref 42–145)
TIBC: 330 ug/dL (ref 250–470)
UIBC: 184 ug/dL (ref 125–400)

## 2014-08-22 LAB — LIPID PANEL
Cholesterol: 179 mg/dL (ref 0–200)
HDL: 63 mg/dL (ref >=46)
LDL Cholesterol: 95 mg/dL (ref 0–99)
Total CHOL/HDL Ratio: 2.8 Ratio
Triglycerides: 104 mg/dL (ref <150)
VLDL: 21 mg/dL (ref 0–40)

## 2014-08-22 LAB — URINALYSIS, MICROSCOPIC ONLY
Casts: NONE SEEN
Crystals: NONE SEEN

## 2014-08-22 LAB — INSULIN, FASTING: Insulin fasting, serum: 3.3 u[IU]/mL (ref 2.0–19.6)

## 2014-08-22 LAB — MAGNESIUM: Magnesium: 1.8 mg/dL (ref 1.5–2.5)

## 2014-08-22 LAB — FERRITIN: Ferritin: 38 ng/mL (ref 10–291)

## 2014-08-22 LAB — TSH: TSH: 1.758 u[IU]/mL (ref 0.350–4.500)

## 2014-08-24 LAB — URINE CULTURE: Colony Count: 50000

## 2014-08-26 MED ORDER — CIPROFLOXACIN HCL 250 MG PO TABS: 250.0000 mg | ORAL_TABLET | Freq: Two times a day (BID) | ORAL | Status: DC

## 2014-08-26 NOTE — Addendum Note (Signed)
Addended by: Vicie Mutters R on: 08/26/2014 08:35 AM   Modules accepted: Orders, SmartSet

## 2014-09-04 ENCOUNTER — Other Ambulatory Visit: Payer: Self-pay | Admitting: Physician Assistant

## 2014-09-19 ENCOUNTER — Other Ambulatory Visit: Payer: Self-pay | Admitting: Internal Medicine

## 2014-09-19 NOTE — Telephone Encounter (Signed)
Rx called into pharm 

## 2014-09-23 ENCOUNTER — Ambulatory Visit (INDEPENDENT_AMBULATORY_CARE_PROVIDER_SITE_OTHER): Payer: Medicare Other | Admitting: *Deleted

## 2014-09-23 DIAGNOSIS — N390 Urinary tract infection, site not specified: Secondary | ICD-10-CM | POA: Diagnosis not present

## 2014-09-23 NOTE — Progress Notes (Signed)
Patient ID: Kelsey Lopez, female   DOB: [birth year], 74 y.o.   MRN: [id number] Patient here for UA and C and S.  She has finished her Cipro and is not having any symptoms.

## 2014-09-24 LAB — URINALYSIS, ROUTINE W REFLEX MICROSCOPIC
Bilirubin Urine: NEGATIVE
Glucose, UA: NEGATIVE
Ketones, ur: NEGATIVE
Leukocytes, UA: NEGATIVE
Nitrite: NEGATIVE
Specific Gravity, Urine: 1.011 (ref 1.001–1.035)
pH: 7.5 (ref 5.0–8.0)

## 2014-09-24 LAB — URINALYSIS, MICROSCOPIC ONLY
Bacteria, UA: NONE SEEN [HPF]
Casts: NONE SEEN [LPF]
Crystals: NONE SEEN [HPF]
Squamous Epithelial / LPF: NONE SEEN [HPF] (ref <=5)
WBC, UA: NONE SEEN WBC/HPF (ref <=5)
Yeast: NONE SEEN [HPF]

## 2014-09-25 LAB — URINE CULTURE

## 2014-10-02 ENCOUNTER — Other Ambulatory Visit: Payer: Self-pay | Admitting: Physician Assistant

## 2014-10-07 ENCOUNTER — Other Ambulatory Visit: Payer: Self-pay | Admitting: Physician Assistant

## 2014-10-24 ENCOUNTER — Other Ambulatory Visit: Payer: Self-pay | Admitting: Physician Assistant

## 2014-10-25 NOTE — Telephone Encounter (Signed)
Rx called in @ [facility]

## 2014-10-31 ENCOUNTER — Other Ambulatory Visit: Payer: Self-pay | Admitting: Internal Medicine

## 2014-11-23 ENCOUNTER — Other Ambulatory Visit: Payer: Self-pay | Admitting: Physician Assistant

## 2014-11-23 DIAGNOSIS — F411 Generalized anxiety disorder: Secondary | ICD-10-CM

## 2014-11-27 ENCOUNTER — Ambulatory Visit (INDEPENDENT_AMBULATORY_CARE_PROVIDER_SITE_OTHER): Payer: Medicare Other | Admitting: Internal Medicine

## 2014-11-27 ENCOUNTER — Encounter: Payer: Self-pay | Admitting: Internal Medicine

## 2014-11-27 VITALS — BP 118/74 | HR 60 | Temp 97.0°F | Resp 16 | Ht 64.5 in | Wt 177.0 lb

## 2014-11-27 DIAGNOSIS — F325 Major depressive disorder, single episode, in full remission: Secondary | ICD-10-CM | POA: Insufficient documentation

## 2014-11-27 DIAGNOSIS — I10 Essential (primary) hypertension: Secondary | ICD-10-CM | POA: Diagnosis not present

## 2014-11-27 DIAGNOSIS — E782 Mixed hyperlipidemia: Secondary | ICD-10-CM

## 2014-11-27 DIAGNOSIS — E559 Vitamin D deficiency, unspecified: Secondary | ICD-10-CM

## 2014-11-27 DIAGNOSIS — R7303 Prediabetes: Secondary | ICD-10-CM | POA: Diagnosis not present

## 2014-11-27 DIAGNOSIS — R7309 Other abnormal glucose: Secondary | ICD-10-CM | POA: Diagnosis not present

## 2014-11-27 DIAGNOSIS — Z6830 Body mass index (BMI) 30.0-30.9, adult: Secondary | ICD-10-CM | POA: Diagnosis not present

## 2014-11-27 DIAGNOSIS — F32A Depression, unspecified: Secondary | ICD-10-CM

## 2014-11-27 DIAGNOSIS — Z23 Encounter for immunization: Secondary | ICD-10-CM

## 2014-11-27 DIAGNOSIS — Z79899 Other long term (current) drug therapy: Secondary | ICD-10-CM

## 2014-11-27 DIAGNOSIS — F329 Major depressive disorder, single episode, unspecified: Secondary | ICD-10-CM

## 2014-11-27 HISTORY — DX: Major depressive disorder, single episode, in full remission: F32.5

## 2014-11-27 LAB — CBC WITH DIFFERENTIAL/PLATELET
Basophils Absolute: 0 10*3/uL (ref 0.0–0.1)
Basophils Relative: 0 % (ref 0–1)
Eosinophils Absolute: 0.4 10*3/uL (ref 0.0–0.7)
Eosinophils Relative: 6 % — ABNORMAL HIGH (ref 0–5)
HCT: 39.4 % (ref 36.0–46.0)
Hemoglobin: 13.4 g/dL (ref 12.0–15.0)
Lymphocytes Relative: 27 % (ref 12–46)
Lymphs Abs: 1.8 10*3/uL (ref 0.7–4.0)
MCH: 30.5 pg (ref 26.0–34.0)
MCHC: 34 g/dL (ref 30.0–36.0)
MCV: 89.5 fL (ref 78.0–100.0)
MPV: 10.1 fL (ref 8.6–12.4)
Monocytes Absolute: 0.7 10*3/uL (ref 0.1–1.0)
Monocytes Relative: 10 % (ref 3–12)
Neutro Abs: 3.8 10*3/uL (ref 1.7–7.7)
Neutrophils Relative %: 57 % (ref 43–77)
Platelets: 213 10*3/uL (ref 150–400)
RBC: 4.4 MIL/uL (ref 3.87–5.11)
RDW: 13.4 % (ref 11.5–15.5)
WBC: 6.7 10*3/uL (ref 4.0–10.5)

## 2014-11-27 LAB — LIPID PANEL
Cholesterol: 175 mg/dL (ref 125–200)
HDL: 56 mg/dL (ref >=46)
LDL Cholesterol: 94 mg/dL (ref <130)
Total CHOL/HDL Ratio: 3.1 Ratio (ref <=5.0)
Triglycerides: 124 mg/dL (ref <150)
VLDL: 25 mg/dL (ref <30)

## 2014-11-27 LAB — BASIC METABOLIC PANEL WITH GFR
BUN: 12 mg/dL (ref 7–25)
CO2: 29 mmol/L (ref 20–31)
Calcium: 9.3 mg/dL (ref 8.6–10.4)
Chloride: 98 mmol/L (ref 98–110)
Creat: 0.59 mg/dL — ABNORMAL LOW (ref 0.60–0.93)
GFR, Est African American: >89 mL/min (ref >=60)
GFR, Est Non African American: >89 mL/min (ref >=60)
Glucose, Bld: 83 mg/dL (ref 65–99)
Potassium: 3.8 mmol/L (ref 3.5–5.3)
Sodium: 137 mmol/L (ref 135–146)

## 2014-11-27 LAB — HEPATIC FUNCTION PANEL
ALT: 16 U/L (ref 6–29)
AST: 18 U/L (ref 10–35)
Albumin: 3.9 g/dL (ref 3.6–5.1)
Alkaline Phosphatase: 67 U/L (ref 33–130)
Bilirubin, Direct: 0.1 mg/dL (ref <=0.2)
Indirect Bilirubin: 0.5 mg/dL (ref 0.2–1.2)
Total Bilirubin: 0.6 mg/dL (ref 0.2–1.2)
Total Protein: 6 g/dL — ABNORMAL LOW (ref 6.1–8.1)

## 2014-11-27 LAB — TSH: TSH: 2.73 u[IU]/mL (ref 0.350–4.500)

## 2014-11-27 LAB — MAGNESIUM: Magnesium: 1.6 mg/dL (ref 1.5–2.5)

## 2014-11-27 NOTE — Progress Notes (Signed)
Patient ID: Kelsey Lopez, female   DOB: Jun 20, 1940, 74 y.o.   MRN: [id number]   This very nice 74 y.o. [facility] presents for 3 month follow up with Hypertension, Hyperlipidemia, Pre-Diabetes and Vitamin D Deficiency.    Patient is treated for HTN since 1996  & BP has been controlled at home. Today's BP: 118/74 mmHg. Patient has had no complaints of any cardiac type chest pain, palpitations, dyspnea/orthopnea/PND, dizziness, claudication, or dependent edema.   Hyperlipidemia is controlled with diet & meds. Patient denies myalgias or other med SE's. Last Lipids were at goal with  Cholesterol 179; HDL 63; LDL 95; Triglycerides 104 on 08/21/2014.   Also, the patient has history of PreDiabetes and has had no symptoms of reactive hypoglycemia, diabetic polys, paresthesias or visual blurring.  Last A1c was 5.5% on 08/21/2014.   Further, the patient also has history of Vitamin D Deficiency and supplements vitamin D without any suspected side-effects. Last vitamin D was  55 on     08/21/2014.  Medication Sig  . ALPRAZolam  0.5 MG tablet TAKE ONE-HALF TO ONE TABLET BY MOUTH THREE TIMES DAILY AS NEEDED  . aspirin 81 MG EC tablet Take 81 mg by mouth daily.    . cetirizine  10 MG tablet Take 10 mg by mouth as needed.    Marland Kitchen VITAMIN D 2000 UNITS capsule Take 2,000 Units by mouth daily.    . citalopram (C 40 MG tablet TAKE ONE TABLET BY MOUTH DAILY  . Cranberry ( CRANBERRY) 475 MG CAPS Take 1 capsule by mouth daily.    . fish oil 1000 MG cap Take 2 g by mouth daily.    Marland Kitchen FLAX SEED OIL 1000 MG CAPS Take 1,000 mg by mouth daily.  . Glucosamine 500 MG CAPS Take by mouth daily.  . hctz (MICROZIDE) 12.5 MG  TAKE ONE CAPSULE BY MOUTH ONE TO TWO TIMES DAILY  . Magnesium 400 MG CAPS Take 400 mg by mouth daily.  . montelukast  10 MG tablet TAKE ONE TABLET BY MOUTH ONE TIME DAILY  . Multiple Vitamin  Take 1 tablet by mouth daily.    . Calcium 1800 mg  1 tablet daily   . Otc Mucinex  1 tab daily  . simvastatin  40 MG  tablet TAKE ONE TABLET BY MOUTH AT BEDTIME  . traZODone  150 MG tablet TAKE ONE-HALF TO ONE TABLET BY MOUTH ONE HOUR PRIOR TO BEDTIME  . valsartan 320 MG tablet Take 1 tablet (320 mg total) by mouth daily.   Allergies  Allergen Reactions  . Ampicillin     rash  . Mevacor [Lovastatin]     Elevated LFT's  . Penicillins     REACTION: hives and  itching  . Sulfa Antibiotics     rash   PMHx:   Past Medical History  Diagnosis Date  . Dyslipidemia     on Rx x20 years  . HTN (hypertension)   . Multiple allergies   . PVC (premature ventricular contraction)   . Hyperlipidemia   . Anxiety   . Insomnia   . Morton's neuroma of left foot   . Osteoarthritis   . Polycythemia    Immunization History  Administered Date(s) Administered  . Influenza Split 11/15/2011, 11/28/2012, 10/31/2013, 01/07/2014  . Pneumococcal Conjugate-13 10/02/2013  . Tdap 08/01/2013  . Zoster 05/25/2011   Past Surgical History  Procedure Laterality Date  . Tonsillectomy    . Vesicovaginal fistula closure w/ tah    . Knee  arthroscopy      R  . Abdominal hysterectomy     FHx:    Reviewed / unchanged  SHx:    Reviewed / unchanged  Systems Review:  Constitutional: Denies fever, chills, wt changes, headaches, insomnia, fatigue, night sweats, change in appetite. Eyes: Denies redness, blurred vision, diplopia, discharge, itchy, watery eyes.  ENT: Denies discharge, congestion, post nasal drip, epistaxis, sore throat, earache, hearing loss, dental pain, tinnitus, vertigo, sinus pain, snoring.  CV: Denies chest pain, palpitations, irregular heartbeat, syncope, dyspnea, diaphoresis, orthopnea, PND, claudication or edema. Respiratory: denies cough, dyspnea, DOE, pleurisy, hoarseness, laryngitis, wheezing.  Gastrointestinal: Denies dysphagia, odynophagia, heartburn, reflux, water brash, abdominal pain or cramps, nausea, vomiting, bloating, diarrhea, constipation, hematemesis, melena, hematochezia  or  hemorrhoids. Genitourinary: Denies dysuria, frequency, urgency, nocturia, hesitancy, discharge, hematuria or flank pain. Musculoskeletal: Denies arthralgias, myalgias, stiffness, jt. swelling, pain, limping or strain/sprain.  Skin: Denies pruritus, rash, hives, warts, acne, eczema or change in skin lesion(s). Neuro: No weakness, tremor, incoordination, spasms, paresthesia or pain. Psychiatric: Denies confusion, memory loss or sensory loss. Endo: Denies change in weight, skin or hair change.  Heme/Lymph: No excessive bleeding, bruising or enlarged lymph nodes.  Physical Exam  BP 118/74 mmHg  Pulse 60  Temp(Src) 97 F (36.1 C)  Resp 16  Ht 5' 4.5" (1.638 m)  Wt 177 lb (80.287 kg)  BMI 29.92 kg/m2  Appears well nourished and in no distress. Eyes: PERRLA, EOMs, conjunctiva no swelling or erythema. Sinuses: No frontal/maxillary tenderness ENT/Mouth: EAC's clear, TM's nl w/o erythema, bulging. Nares clear w/o erythema, swelling, exudates. Oropharynx clear without erythema or exudates. Oral hygiene is good. Tongue normal, non obstructing. Hearing intact.  Neck: Supple. Thyroid nl. Car 2+/2+ without bruits, nodes or JVD. Chest: Respirations nl with BS clear & equal w/o rales, rhonchi, wheezing or stridor.  Cor: Heart sounds normal w/ regular rate and rhythm without sig. murmurs, gallops, clicks, or rubs. Peripheral pulses normal and equal  without edema.  Abdomen: Soft & bowel sounds normal. Non-tender w/o guarding, rebound, hernias, masses, or organomegaly.  Lymphatics: Unremarkable.  Musculoskeletal: Full ROM all peripheral extremities, joint stability, 5/5 strength, and normal gait.  Skin: Warm, dry without exposed rashes, lesions or ecchymosis apparent.  Neuro: Cranial nerves intact, reflexes equal bilaterally. Sensory-motor testing grossly intact. Tendon reflexes grossly intact.  Pysch: Alert & oriented x 3.  Insight and judgement nl & appropriate. No ideations.  Assessment and  Plan:  1. Essential hypertension  - TSH  2. Mixed hyperlipidemia  - Lipid panel  3. Prediabetes  - Hemoglobin A1c - Insulin, random  4. Vitamin D deficiency  - Vit D  25 hydroxy  5. BMI 30.23,  adult   6. Need for prophylactic vaccination and inoculation against influenza  - Flu vaccine HIGH DOSE PF (Fluzone High dose)  7. Medication management  - CBC with Differential/Platelet - BASIC METABOLIC PANEL WITH GFR - Hepatic function panel - Magnesium  8. Depression, controlled   Recommended regular exercise, BP monitoring, weight control, and discussed med and SE's. Recommended labs to assess and monitor clinical status. Further disposition pending results of labs. Over 30 minutes of exam, counseling, chart review was performed

## 2014-11-27 NOTE — Patient Instructions (Signed)

## 2014-11-28 LAB — INSULIN, RANDOM: Insulin: 5.8 u[IU]/mL (ref 2.0–19.6)

## 2014-11-28 LAB — HEMOGLOBIN A1C
Hgb A1c MFr Bld: 5.5 % (ref <5.7)
Mean Plasma Glucose: 111 mg/dL (ref <117)

## 2014-11-28 LAB — VITAMIN D 25 HYDROXY (VIT D DEFICIENCY, FRACTURES): Vit D, 25-Hydroxy: 56 ng/mL (ref 30–100)

## 2014-12-23 ENCOUNTER — Other Ambulatory Visit: Payer: Self-pay | Admitting: Internal Medicine

## 2015-01-05 ENCOUNTER — Other Ambulatory Visit: Payer: Self-pay | Admitting: Physician Assistant

## 2015-01-08 ENCOUNTER — Other Ambulatory Visit: Payer: Self-pay | Admitting: Internal Medicine

## 2015-01-14 ENCOUNTER — Other Ambulatory Visit: Payer: Self-pay | Admitting: Internal Medicine

## 2015-01-22 ENCOUNTER — Other Ambulatory Visit: Payer: Self-pay | Admitting: Physician Assistant

## 2015-02-04 ENCOUNTER — Other Ambulatory Visit: Payer: Self-pay | Admitting: Physician Assistant

## 2015-02-13 ENCOUNTER — Other Ambulatory Visit: Payer: Self-pay | Admitting: Internal Medicine

## 2015-02-21 ENCOUNTER — Other Ambulatory Visit: Payer: Self-pay | Admitting: Physician Assistant

## 2015-02-26 ENCOUNTER — Other Ambulatory Visit: Payer: Self-pay | Admitting: Physician Assistant

## 2015-02-27 ENCOUNTER — Ambulatory Visit (INDEPENDENT_AMBULATORY_CARE_PROVIDER_SITE_OTHER): Payer: Medicare Other | Admitting: Physician Assistant

## 2015-02-27 ENCOUNTER — Encounter: Payer: Self-pay | Admitting: Physician Assistant

## 2015-02-27 VITALS — BP 118/60 | HR 73 | Temp 97.5°F | Resp 16 | Ht 64.5 in | Wt 181.0 lb

## 2015-02-27 DIAGNOSIS — R7309 Other abnormal glucose: Secondary | ICD-10-CM | POA: Diagnosis not present

## 2015-02-27 DIAGNOSIS — I10 Essential (primary) hypertension: Secondary | ICD-10-CM | POA: Diagnosis not present

## 2015-02-27 DIAGNOSIS — D45 Polycythemia vera: Secondary | ICD-10-CM | POA: Diagnosis not present

## 2015-02-27 DIAGNOSIS — E782 Mixed hyperlipidemia: Secondary | ICD-10-CM

## 2015-02-27 DIAGNOSIS — J449 Chronic obstructive pulmonary disease, unspecified: Secondary | ICD-10-CM

## 2015-02-27 DIAGNOSIS — E559 Vitamin D deficiency, unspecified: Secondary | ICD-10-CM

## 2015-02-27 DIAGNOSIS — B3731 Acute candidiasis of vulva and vagina: Secondary | ICD-10-CM

## 2015-02-27 DIAGNOSIS — R6889 Other general symptoms and signs: Secondary | ICD-10-CM | POA: Diagnosis not present

## 2015-02-27 DIAGNOSIS — Z79899 Other long term (current) drug therapy: Secondary | ICD-10-CM

## 2015-02-27 DIAGNOSIS — N765 Ulceration of vagina: Secondary | ICD-10-CM

## 2015-02-27 DIAGNOSIS — F419 Anxiety disorder, unspecified: Secondary | ICD-10-CM

## 2015-02-27 DIAGNOSIS — Z0000 Encounter for general adult medical examination without abnormal findings: Secondary | ICD-10-CM

## 2015-02-27 DIAGNOSIS — K648 Other hemorrhoids: Secondary | ICD-10-CM

## 2015-02-27 DIAGNOSIS — G4700 Insomnia, unspecified: Secondary | ICD-10-CM | POA: Diagnosis not present

## 2015-02-27 DIAGNOSIS — M1990 Unspecified osteoarthritis, unspecified site: Secondary | ICD-10-CM | POA: Diagnosis not present

## 2015-02-27 DIAGNOSIS — Z0001 Encounter for general adult medical examination with abnormal findings: Secondary | ICD-10-CM | POA: Diagnosis not present

## 2015-02-27 DIAGNOSIS — E669 Obesity, unspecified: Secondary | ICD-10-CM

## 2015-02-27 DIAGNOSIS — B373 Candidiasis of vulva and vagina: Secondary | ICD-10-CM

## 2015-02-27 DIAGNOSIS — R300 Dysuria: Secondary | ICD-10-CM

## 2015-02-27 DIAGNOSIS — J4489 Other specified chronic obstructive pulmonary disease: Secondary | ICD-10-CM

## 2015-02-27 DIAGNOSIS — K644 Residual hemorrhoidal skin tags: Secondary | ICD-10-CM

## 2015-02-27 DIAGNOSIS — F325 Major depressive disorder, single episode, in full remission: Secondary | ICD-10-CM

## 2015-02-27 LAB — CBC WITH DIFFERENTIAL/PLATELET
Basophils Absolute: 0.1 10*3/uL (ref 0.0–0.1)
Basophils Relative: 1 % (ref 0–1)
Eosinophils Absolute: 0.3 10*3/uL (ref 0.0–0.7)
Eosinophils Relative: 4 % (ref 0–5)
HCT: 43.1 % (ref 36.0–46.0)
Hemoglobin: 14.5 g/dL (ref 12.0–15.0)
Lymphocytes Relative: 30 % (ref 12–46)
Lymphs Abs: 2.1 10*3/uL (ref 0.7–4.0)
MCH: 30.2 pg (ref 26.0–34.0)
MCHC: 33.6 g/dL (ref 30.0–36.0)
MCV: 89.8 fL (ref 78.0–100.0)
MPV: 9.4 fL (ref 8.6–12.4)
Monocytes Absolute: 0.6 10*3/uL (ref 0.1–1.0)
Monocytes Relative: 8 % (ref 3–12)
Neutro Abs: 4 10*3/uL (ref 1.7–7.7)
Neutrophils Relative %: 57 % (ref 43–77)
Platelets: 218 10*3/uL (ref 150–400)
RBC: 4.8 MIL/uL (ref 3.87–5.11)
RDW: 15.4 % (ref 11.5–15.5)
WBC: 7.1 10*3/uL (ref 4.0–10.5)

## 2015-02-27 MED ORDER — TERCONAZOLE 0.4 % VA CREA: 1.0000 | TOPICAL_CREAM | Freq: Every day | VAGINAL | Status: DC

## 2015-02-27 MED ORDER — HYDROCORTISONE 1 % RE CREA: TOPICAL_CREAM | RECTAL | Status: DC

## 2015-02-27 MED ORDER — FLUCONAZOLE 150 MG PO TABS: 150.0000 mg | ORAL_TABLET | Freq: Once | ORAL | Status: DC

## 2015-02-27 NOTE — Progress Notes (Signed)
Patient ID: Kelsey Lopez, female   DOB: December 18, 1940, 75 y.o.   MRN: [id number] MEDICARE ANNUAL WELLNESS VISIT AND 3 month  Assessment:   1. Essential hypertension - continue medications, DASH diet, exercise and monitor at home. Call if greater than 130/80.  - BASIC METABOLIC PANEL WITH GFR - Hepatic function panel - TSH  2. Prediabetes Discussed general issues about diabetes pathophysiology and management., Educational material distributed., Suggested low cholesterol diet., Encouraged aerobic exercise., Discussed foot care., Reminded to get yearly retinal exam.  3. Polycythemia vera - CBC with Differential/Platelet  4. Mixed hyperlipidemia -continue medications, check lipids, decrease fatty foods, increase activity.  - Lipid panel  5. Anxiety Anxiety- continue medications, stress management techniques discussed, increase water, good sleep hygiene discussed, increase exercise, and increase veggies.  - citalopram (CELEXA) 40 MG tablet; Take 1 tablet (40 mg total) by mouth daily.  Dispense: 90 tablet; Refill: 1  6. Medication management - Magnesium  7. Vitamin D deficiency - Vit D  25 hydroxy (rtn osteoporosis monitoring)  8. Osteoarthritis, unspecified osteoarthritis type, unspecified site RICE, NSAIDS, exercises given, if not better get xray and PT referral or ortho referral.   9. Morton's neuroma of left foot remission  10. Obesity Obesity with co morbidities- long discussion about weight loss, diet, and exercise  11. COPD (chronic obstructive pulmonary disease) with chronic bronchitis  continue meds.   12. Insomnia Insomnia- good sleep hygiene discussed, increase day time activity, try melatonin or benadryl if this does not help we will call in sleep medication.  - continue trazodone  13. Dysuria - Urinalysis, Routine w reflex microscopic (not at [facility]) - Urine culture  14. Vaginal ulcer - HSV(herpes simplex vrs) 1+2 ab-IgG - follow up 2-4 weeks recheck  15.  Vaginal yeast infection - fluconazole (DIFLUCAN) 150 MG tablet; Take 1 tablet (150 mg total) by mouth once.  Dispense: 4 tablet; Refill: 1 - terconazole (TERAZOL 7) 0.4 % vaginal cream; Place 1 applicator vaginally at bedtime.  Dispense: 45 g; Refill: 2  16. External hemorrhoid - hydrocortisone (PROCTOCORT) 1 % CREA; Apply once daily as needed on hemorrhoid  Dispense: 28.35 g; Refill: 5      Plan:   During the course of the visit the patient was educated and counseled about appropriate screening and preventive services including:    Pneumococcal vaccine   Influenza vaccine  Td vaccine  Screening electrocardiogram  Screening mammography  Bone densitometry screening  Colorectal cancer screening  Diabetes screening  Glaucoma screening  Nutrition counseling   Advanced directives: given information/requested  Conditions/risks identified: BMI: Discussed weight loss, diet, and increase physical activity.  Increase physical activity: AHA recommends 150 minutes of physical activity a week.  Medications reviewed DEXA- not indicated/up to date PreDiabetes at goal, ACE/ARB therapy No, Reason not on Ace Inhibitor/ARB therapy:  preDM Urinary Incontinence is not an issue: discussed non pharmacology and pharmacology options.  Fall risk: low- discussed PT, home fall assessment, medications.   Subjective:   Kelsey Lopez is a 75 y.o. female who presents for Medicare Annual Wellness Visit and 3 month follow up for HTN, chol, and preDM.    Date of last medicare wellness visit was 04/2014  Her blood pressure has been controlled at home, on valsartan 1/2 pill, has helped cough, today their BP is BP: 118/60 mmHg She does workout. She denies chest pain, shortness of breath, dizziness.  She has been having burning with urination, "feels scalded", some itching, but no discharge, but it is feeling  better now.  She is on cholesterol medication and denies myalgias. Her cholesterol is not at  goal. The cholesterol last visit was:   Lab Results  Component Value Date   CHOL 175 11/27/2014   HDL 56 11/27/2014   LDLCALC 94 11/27/2014   TRIG 124 11/27/2014   CHOLHDL 3.1 11/27/2014   Last A1C in the office was:  Lab Results  Component Value Date   HGBA1C 5.5 11/27/2014  Patient is on Vitamin D supplement.   He has COPD due to being former smoker. She has a history of polycythemia vera and is being monitored Lab Results  Component Value Date   WBC 6.7 11/27/2014   HGB 13.4 11/27/2014   HCT 39.4 11/27/2014   MCV 89.5 11/27/2014   PLT 213 11/27/2014  She is on celexa for anxiety, started after her husband passed 5 years ago and think it is still helping, she is on xanax PRN and she is on trazodone. She takes care of her handicap brother and this is extra stress/anxiety.  BMI is Body mass index is 30.6 kg/(m^2)., she is working on diet and exercise. Wt Readings from Last 3 Encounters:  02/27/15 181 lb (82.101 kg)  11/27/14 177 lb (80.287 kg)  08/21/14 176 lb (79.833 kg)    Names of Other Physician/Practitioners you currently use: Patient Care Team: Unk Pinto, MD as PCP - General (Internal Medicine) Druscilla Brownie, MD as Consulting Physician (Dermatology) Clarene Essex, MD as Consulting Physician (Gastroenterology) Fay Records, MD as Consulting Physician (Cardiology) Orvan Seen, [facility]) Tye Savoy, (Dentist)  Medication Review Current Outpatient Prescriptions on File Prior to Visit  Medication Sig Dispense Refill  . ALPRAZolam (XANAX) 0.5 MG tablet TAKE ONE-HALF TO ONE TABLET BY MOUTH THREE TIMES DAILY AS NEEDED 90 tablet 5  . aspirin 81 MG EC tablet Take 81 mg by mouth daily.      . cetirizine (ZYRTEC) 10 MG tablet Take 10 mg by mouth as needed.      . Cholecalciferol (VITAMIN D3) 2000 UNITS capsule Take 2,000 Units by mouth daily.      . citalopram (CELEXA) 40 MG tablet TAKE ONE TABLET BY MOUTH DAILY 90 tablet 0  . Cranberry ([employer] CRANBERRY) 475 MG CAPS Take 1 capsule  by mouth daily.      . fish oil-omega-3 fatty acids 1000 MG capsule Take 2 g by mouth daily.      . Flaxseed, Linseed, (FLAX SEED OIL) 1000 MG CAPS Take 1,000 mg by mouth daily.    . Glucosamine 500 MG CAPS Take by mouth daily.    . hydrochlorothiazide (MICROZIDE) 12.5 MG capsule TAKE ONE CAPSULE BY MOUTH ONE TO TWO TIMES DAILY 90 capsule 1  . Magnesium 400 MG CAPS Take 400 mg by mouth daily.    . montelukast (SINGULAIR) 10 MG tablet TAKE ONE TABLET BY MOUTH ONE TIME DAILY 30 tablet 3  . Multiple Vitamin (MULTIVITAMIN) tablet Take 1 tablet by mouth daily.      . NON FORMULARY Calcium 1800 mg 1 tablet daily     . OVER THE COUNTER MEDICATION Otc Mucinex 1 tab daily    . simvastatin (ZOCOR) 40 MG tablet TAKE ONE TABLET BY MOUTH AT BEDTIME 90 tablet 1  . traZODone (DESYREL) 150 MG tablet TAKE ONE-HALF TO ONE TABLET BY MOUTH ONE HOUR PRIOR TO BEDTIME 30 tablet 1  . valsartan (DIOVAN) 320 MG tablet TAKE 1 TABLET (320 MG TOTAL) BY MOUTH DAILY. 30 tablet 3   No current facility-administered medications on  file prior to visit.   Allergies  Allergen Reactions  . Ace Inhibitors     Cough  . Ampicillin     rash  . Mevacor [Lovastatin]     Elevated LFT's  . Penicillins     REACTION: hives and  itching  . Sulfa Antibiotics     rash     Current Problems (verified) Patient Active Problem List   Diagnosis Date Noted  . Depression, major, in remission ([place]) 11/27/2014  . Encounter for Medicare annual wellness exam 08/22/2014  . Obesity 04/10/2014  . COPD (chronic obstructive pulmonary disease) with chronic bronchitis ([place]) 04/10/2014  . Insomnia 04/10/2014  . Vitamin D deficiency 01/07/2014  . Abnormal blood sugar 01/03/2014  . Medication management 01/03/2014  . Anxiety   . Morton's neuroma of left foot   . Osteoarthritis   . Polycythemia vera ([place]) 06/05/2009  . Mixed hyperlipidemia 06/05/2009  . Essential hypertension 06/05/2009    Immunization History  Administered Date(s)  Administered  . Influenza Split 11/15/2011, 11/28/2012, 10/31/2013, 01/07/2014  . Influenza, High Dose Seasonal PF 11/27/2014  . Pneumococcal Conjugate-13 10/02/2013  . Tdap 08/01/2013  . Zoster 05/25/2011    Preventative care: Last colonoscopy: 05/2012 Last mammogram: 07/19/13 q 2 years  Last pap smear/pelvic exam: 2007  DEXA:07/19/13 osteopenia ECHO: 07/14/09 CXR 08/2014  Prior vaccinations: TDAP: 2015  Influenza: 2016  Pneumococcal: 2004 Prevnar 13: 2015 Shingles/Zostavax: 2013   Past Surgical History  Procedure Laterality Date  . Tonsillectomy    . Vesicovaginal fistula closure w/ tah    . Knee arthroscopy      R  . Abdominal hysterectomy      Family History  Problem Relation Age of Onset  . Osteoarthritis Mother   . Heart attack Mother   . Heart disease Mother   . Cancer Father     lung  . Osteoarthritis Father   . Hyperlipidemia Brother   . Diabetes Brother    Tobacco Social History  Substance Use Topics  . Smoking status: Never Smoker   . Smokeless tobacco: None     Comment: no tobacco   . Alcohol Use: Yes     Comment: rare   She does not smoke.  Patient is a former smoker. Alcohol Current alcohol use: social drinker Osteoporosis: postmenopausal estrogen deficiency and dietary calcium and/or vitamin D deficiency, History of fracture in the past year: no Fall risk: Low Risk Hearing: normal Visual acuity: normal,  does perform annual eye exam Diet: in general, a "healthy" diet   Physical activity: Current Exercise Habits:: Home exercise routine, Type of exercise: walking, Time (Minutes): 20, Frequency (Times/Week): 3, Weekly Exercise (Minutes/Week): 60, Intensity: Mild Cardiac risk factors: Cardiac Risk Factors include: advanced age (>66men, >86 women);dyslipidemia;hypertension;sedentary lifestyle;obesity (BMI >30kg/m2) Depression/mood screen:   Depression screen [facility] 2/9 02/27/2015  Decreased Interest 0  Down, Depressed, Hopeless 0  PHQ - 2 Score 0     ADLs:  In your present state of health, do you have any difficulty performing the following activities: 02/27/2015 11/27/2014  Hearing? N N  Vision? N N  Difficulty concentrating or making decisions? N N  Walking or climbing stairs? N N  Dressing or bathing? N N  Doing errands, shopping? N N  Preparing Food and eating ? N -  Using the Toilet? N -  In the past six months, have you accidently leaked urine? N -  Do you have problems with loss of bowel control? N -  Managing your Medications? N -  Managing your Finances? N -  Housekeeping or managing your Housekeeping? N -     Cognitive Testing  Alert? Yes  Normal Appearance?Yes  Oriented to person? Yes  Place? Yes   Time? Yes  Recall of three objects?  Yes  Can perform simple calculations? Yes  Displays appropriate judgment?Yes  Can read the correct time from a watch face?Yes  EOL planning: Does patient have an advance directive?: Yes Type of Advance Directive: [place], Living will Does patient want to make changes to advanced directive?: No - Patient declined Copy of advanced directive(s) in chart?: No - copy requested   Objective:     Blood pressure 118/60, pulse 73, temperature 97.5 F (36.4 C), temperature source Temporal, resp. rate 16, height 5' 4.5" (1.638 m), weight 181 lb (82.101 kg), SpO2 94 %. Body mass index is 30.6 kg/(m^2).  General appearance: alert, no distress, WD/WN,  female HEENT: normocephalic, sclerae anicteric, TMs pearly, nares patent, no discharge or erythema, pharynx normal Oral cavity: MMM, no lesions Neck: supple, no lymphadenopathy, no thyromegaly, no masses Heart: RRR, normal S1, S2, no murmurs Lungs: CTA bilaterally, no wheezes, rhonchi, or rales Abdomen: +bs, soft, non tender, non distended, no masses, no hepatomegaly, no splenomegaly Musculoskeletal: nontender, no swelling, no obvious deformity Extremities: no edema, no cyanosis, no clubbing Pulses: 2+ symmetric, upper  and lower extremities, normal cap refill Neurological: alert, oriented x 3, CN2-12 intact, strength normal upper extremities and lower extremities, sensation normal throughout, DTRs 2+ throughout, no cerebellar signs, gait normal Skin: WNL VULVA: vulvar tenderness , vulvar erythema with large ulcer left labia and smaller ulcer right labia with white rash/discharge, VAGINA: vaginal erythema Rectal: 5x56mm external tender non thrombosed hemorrhoid at 12 o clock Breasts: breasts appear normal, no suspicious masses, no skin or nipple changes or axillary nodes. Psychiatric: normal affect, behavior normal, pleasant  Breast:  defer Rectal: defer   Medicare Attestation I have personally reviewed: The patient's medical and social history Their use of alcohol, tobacco or illicit drugs Their current medications and supplements The patient's functional ability including ADLs,fall risks, home safety risks, cognitive, and hearing and visual impairment Diet and physical activities Evidence for depression or mood disorders  The patient's weight, height, BMI, and visual acuity have been recorded in the chart.  I have made referrals, counseling, and provided education to the patient based on review of the above and I have provided the patient with a written personalized care plan for preventive services.     Vicie Mutters, PA-C   02/27/2015

## 2015-02-27 NOTE — Patient Instructions (Addendum)
Tumeric with black pepper extract is a great natural antiinflammatory that helps with arthritis and aches and pain. Can get from [employer] or any health food store. Need to take at least 800mg  twice a day with food.   Can send in mobic/melxoicam 15mg  once daily  Or we can do voltern gel/refer to ortho for injection  Can do sitz bathes, pat dry well.   Can do diflucan once a week, use vaginal cream at night  About Hemorrhoids  Hemorrhoids are swollen veins in the lower rectum and anus.  Also called piles, hemorrhoids are a common problem.  Hemorrhoids may be internal (inside the rectum) or external (around the anus).  Internal Hemorrhoids  Internal hemorrhoids are often painless, but they rarely cause bleeding.  The internal veins may stretch and fall down (prolapse) through the anus to the outside of the body.  The veins may then become irritated and painful.  External Hemorrhoids  External hemorrhoids can be easily seen or felt around the anal opening.  They are under the skin around the anus.  When the swollen veins are scratched or broken by straining, rubbing or wiping they sometimes bleed.  How Hemorrhoids Occur  Veins in the rectum and around the anus tend to swell under pressure.  Hemorrhoids can result from increased pressure in the veins of your anus or rectum.  Some sources of pressure are:   Straining to have a bowel movement because of constipation  Waiting too long to have a bowel movement  Coughing and sneezing often  Sitting for extended periods of time, including on the toilet  Diarrhea  Obesity  Trauma or injury to the anus  Some liver diseases  Stress  Family history of hemorrhoids  Pregnancy  Pregnant women should try to avoid becoming constipated, because they are more likely to have hemorrhoids during pregnancy.  In the last trimester of pregnancy, the enlarged uterus may press on blood vessels and causes hemorrhoids.  In addition, the strain of  childbirth sometimes causes hemorrhoids after the birth.  Symptoms of Hemorrhoids  Some symptoms of hemorrhoids include:  Swelling and/or a tender lump around the anus  Itching, mild burning and bleeding around the anus  Painful bowel movements with or without constipation  Bright red blood covering the stool, on toilet paper or in the toilet bowel.   Symptoms usually go away within a few days.  Always talk to your doctor about any bleeding to make sure it is not from some other causes.  Diagnosing and Treating Hemorrhoids  Diagnosis is made by an examination by your healthcare provider.  Special test can be performed by your doctor.    Most cases of hemorrhoids can be treated with:  High-fiber diet: Eat more high-fiber foods, which help prevent constipation.  Ask for more detailed fiber information on types and sources of fiber from your healthcare provider.  Fluids: Drink plenty of water.  This helps soften bowel movements so they are easier to pass.  Sitz baths and cold packs: Sitting in lukewarm water two or three times a day for 15 minutes cleases the anal area and may relieve discomfort.  If the water is too hot, swelling around the anus will get worse.  Placing a cloth-covered ice pack on the anus for ten minutes four times a day can also help reduce selling.  Gently pushing a prolapsed hemorrhoid back inside after the bath or ice pack can be helpful.  Medications: For mild discomfort, your healthcare provider may suggest  over-the-counter pain medication or prescribe a cream or ointment for topical use.  The cream may contain witch hazel, zinc oxide or petroleum jelly.  Medicated suppositories are also a treatment option.  Always consult your doctor before applying medications or creams.  Procedures and surgeries: There are also a number of procedures and surgeries to shrink or remove hemorrhoids in more serious cases.  Talk to your physician about these options.  You can often  prevent hemorrhoids or keep them from becoming worse by maintaining a healthy lifestyle.  Eat a fiber-rich diet of fruits, vegetables and whole grains.  Also, drink plenty of water and exercise regularly.   2007, [employer] Doc.30

## 2015-02-28 LAB — URINALYSIS, ROUTINE W REFLEX MICROSCOPIC
Bilirubin Urine: NEGATIVE
Glucose, UA: NEGATIVE
Ketones, ur: NEGATIVE
Leukocytes, UA: NEGATIVE
Nitrite: NEGATIVE
Specific Gravity, Urine: 1.009 (ref 1.001–1.035)
pH: 6 (ref 5.0–8.0)

## 2015-02-28 LAB — INSULIN, FASTING: Insulin fasting, serum: 15.1 u[IU]/mL (ref 2.0–19.6)

## 2015-02-28 LAB — BASIC METABOLIC PANEL WITH GFR
BUN: 13 mg/dL (ref 7–25)
CO2: 29 mmol/L (ref 20–31)
Calcium: 9.6 mg/dL (ref 8.6–10.4)
Chloride: 98 mmol/L (ref 98–110)
Creat: 0.51 mg/dL — ABNORMAL LOW (ref 0.60–0.93)
GFR, Est African American: >89 mL/min (ref >=60)
GFR, Est Non African American: >89 mL/min (ref >=60)
Glucose, Bld: 104 mg/dL — ABNORMAL HIGH (ref 65–99)
Potassium: 3.4 mmol/L — ABNORMAL LOW (ref 3.5–5.3)
Sodium: 136 mmol/L (ref 135–146)

## 2015-02-28 LAB — HEPATIC FUNCTION PANEL
ALT: 18 U/L (ref 6–29)
AST: 21 U/L (ref 10–35)
Albumin: 4 g/dL (ref 3.6–5.1)
Alkaline Phosphatase: 76 U/L (ref 33–130)
Bilirubin, Direct: 0.1 mg/dL (ref <=0.2)
Indirect Bilirubin: 0.4 mg/dL (ref 0.2–1.2)
Total Bilirubin: 0.5 mg/dL (ref 0.2–1.2)
Total Protein: 6.1 g/dL (ref 6.1–8.1)

## 2015-02-28 LAB — HSV(HERPES SIMPLEX VRS) I + II AB-IGG
HSV 1 Glycoprotein G Ab, IgG: 1.29 IV — ABNORMAL HIGH
HSV 2 Glycoprotein G Ab, IgG: <0.1 IV

## 2015-02-28 LAB — LIPID PANEL
Cholesterol: 187 mg/dL (ref 125–200)
HDL: 55 mg/dL (ref >=46)
LDL Cholesterol: 92 mg/dL (ref <130)
Total CHOL/HDL Ratio: 3.4 Ratio (ref <=5.0)
Triglycerides: 202 mg/dL — ABNORMAL HIGH (ref <150)
VLDL: 40 mg/dL — ABNORMAL HIGH (ref <30)

## 2015-02-28 LAB — URINALYSIS, MICROSCOPIC ONLY
Bacteria, UA: NONE SEEN [HPF]
Casts: NONE SEEN [LPF]
Crystals: NONE SEEN [HPF]
Squamous Epithelial / LPF: NONE SEEN [HPF] (ref <=5)
WBC, UA: NONE SEEN WBC/HPF (ref <=5)
Yeast: NONE SEEN [HPF]

## 2015-02-28 LAB — MAGNESIUM: Magnesium: 1.7 mg/dL (ref 1.5–2.5)

## 2015-02-28 LAB — HEMOGLOBIN A1C
Hgb A1c MFr Bld: 5.7 % — ABNORMAL HIGH (ref <5.7)
Mean Plasma Glucose: 117 mg/dL — ABNORMAL HIGH (ref <117)

## 2015-02-28 LAB — VITAMIN D 25 HYDROXY (VIT D DEFICIENCY, FRACTURES): Vit D, 25-Hydroxy: 80 ng/mL (ref 30–100)

## 2015-02-28 LAB — URINE CULTURE: Colony Count: 3000

## 2015-02-28 LAB — TSH: TSH: 2.324 u[IU]/mL (ref 0.350–4.500)

## 2015-03-06 ENCOUNTER — Encounter: Payer: Self-pay | Admitting: Physician Assistant

## 2015-03-19 ENCOUNTER — Ambulatory Visit (INDEPENDENT_AMBULATORY_CARE_PROVIDER_SITE_OTHER): Payer: Medicare Other | Admitting: Physician Assistant

## 2015-03-19 ENCOUNTER — Encounter: Payer: Self-pay | Admitting: Physician Assistant

## 2015-03-19 VITALS — BP 100/60 | HR 64 | Temp 97.5°F | Resp 16 | Ht 64.5 in | Wt 181.2 lb

## 2015-03-19 DIAGNOSIS — B3731 Acute candidiasis of vulva and vagina: Secondary | ICD-10-CM

## 2015-03-19 DIAGNOSIS — N765 Ulceration of vagina: Secondary | ICD-10-CM

## 2015-03-19 DIAGNOSIS — R319 Hematuria, unspecified: Secondary | ICD-10-CM | POA: Diagnosis not present

## 2015-03-19 DIAGNOSIS — B373 Candidiasis of vulva and vagina: Secondary | ICD-10-CM

## 2015-03-19 MED ORDER — TERCONAZOLE 0.4 % VA CREA: 1.0000 | TOPICAL_CREAM | Freq: Every day | VAGINAL | Status: DC

## 2015-03-19 NOTE — Progress Notes (Signed)
Assessment and Plan: 1. Vaginal ulcer, non healing, negative HSV - Ambulatory referral to Gynecology - terconazole (TERAZOL 7) 0.4 % vaginal cream; Place 1 applicator vaginally at bedtime.  Dispense: 45 g; Refill: 2  2. Hematuria Has follow up with urology    HPI 75 y.o.female presents for follow up for vaginal yeast/ulceration. Had negative HSV, she was given diflucan and on terconazole. She has follow up with urologist for hematuria.   Past Medical History  Diagnosis Date  . Dyslipidemia     on Rx x20 years  . HTN (hypertension)   . Multiple allergies   . PVC (premature ventricular contraction)   . Hyperlipidemia   . Anxiety   . Insomnia   . Morton's neuroma of left foot   . Osteoarthritis   . Polycythemia      Allergies  Allergen Reactions  . Ace Inhibitors     Cough  . Ampicillin     rash  . Mevacor [Lovastatin]     Elevated LFT's  . Penicillins     REACTION: hives and  itching  . Sulfa Antibiotics     rash      Current Outpatient Prescriptions on File Prior to Visit  Medication Sig Dispense Refill  . ALPRAZolam (XANAX) 0.5 MG tablet TAKE ONE-HALF TO ONE TABLET BY MOUTH THREE TIMES DAILY AS NEEDED 90 tablet 5  . aspirin 81 MG EC tablet Take 81 mg by mouth daily.      . cetirizine (ZYRTEC) 10 MG tablet Take 10 mg by mouth as needed.      . Cholecalciferol (VITAMIN D3) 2000 UNITS capsule Take 2,000 Units by mouth daily.      . citalopram (CELEXA) 40 MG tablet TAKE ONE TABLET BY MOUTH DAILY 90 tablet 0  . Cranberry ([facility] CRANBERRY) 475 MG CAPS Take 1 capsule by mouth daily.      . fish oil-omega-3 fatty acids 1000 MG capsule Take 2 g by mouth daily.      . Flaxseed, Linseed, (FLAX SEED OIL) 1000 MG CAPS Take 1,000 mg by mouth daily.    . fluconazole (DIFLUCAN) 150 MG tablet Take 1 tablet (150 mg total) by mouth once. 4 tablet 1  . Glucosamine 500 MG CAPS Take by mouth daily.    . hydrochlorothiazide (MICROZIDE) 12.5 MG capsule TAKE ONE CAPSULE BY MOUTH ONE TO  TWO TIMES DAILY 90 capsule 1  . hydrocortisone (PROCTOCORT) 1 % CREA Apply once daily as needed on hemorrhoid 28.35 g 5  . Magnesium 400 MG CAPS Take 400 mg by mouth daily.    . montelukast (SINGULAIR) 10 MG tablet TAKE ONE TABLET BY MOUTH ONE TIME DAILY 30 tablet 3  . Multiple Vitamin (MULTIVITAMIN) tablet Take 1 tablet by mouth daily.      . NON FORMULARY Calcium 1800 mg 1 tablet daily     . OVER THE COUNTER MEDICATION Otc Mucinex 1 tab daily    . simvastatin (ZOCOR) 40 MG tablet TAKE ONE TABLET BY MOUTH AT BEDTIME 90 tablet 1  . traZODone (DESYREL) 150 MG tablet TAKE ONE-HALF TO ONE TABLET BY MOUTH ONE HOUR PRIOR TO BEDTIME 30 tablet 1  . valsartan (DIOVAN) 320 MG tablet TAKE 1 TABLET (320 MG TOTAL) BY MOUTH DAILY. 30 tablet 3   No current facility-administered medications on file prior to visit.    ROS: all negative except above.   Physical Exam: Filed Weights   03/19/15 1603  Weight: 181 lb 3.2 oz (82.192 kg)   BP 100/60  mmHg  Pulse 64  Temp(Src) 97.5 F (36.4 C)  Resp 16  Ht 5' 4.5" (1.638 m)  Wt 181 lb 3.2 oz (82.192 kg)  BMI 30.63 kg/m2  SpO2 97% General Appearance: Well nourished, in no apparent distress. Eyes: PERRLA, EOMs, conjunctiva no swelling or erythema Sinuses: No Frontal/maxillary tenderness ENT/Mouth: Ext aud canals clear, TMs without erythema, bulging. No erythema, swelling, or exudate on post pharynx.  Tonsils not swollen or erythematous. Hearing normal.  Neck: Supple, thyroid normal.  Respiratory: Respiratory effort normal, BS equal bilaterally without rales, rhonchi, wheezing or stridor.  Cardio: RRR with no MRGs. Brisk peripheral pulses without edema.  Abdomen: Soft, + BS.  Non tender, no guarding, rebound, hernias, masses. Lymphatics: Non tender without lymphadenopathy.  Musculoskeletal: Full ROM, 5/5 strength, normal gait.  Skin: Warm, dry without rashes, lesions, ecchymosis.  Neuro: Cranial nerves intact. Normal muscle tone, no cerebellar symptoms.  Sensation intact.  Psych: Awake and oriented X 3, normal affect, Insight and Judgment appropriate.  GYN: less erythema, swelling however still with non healing ulcer right labia.     Vicie Mutters, PA-C 4:08 PM [facility]

## 2015-03-19 NOTE — Patient Instructions (Signed)

## 2015-03-20 ENCOUNTER — Other Ambulatory Visit: Payer: Self-pay | Admitting: Physician Assistant

## 2015-03-20 NOTE — Progress Notes (Signed)
Pharmacy was called and cancelled

## 2015-04-01 ENCOUNTER — Other Ambulatory Visit: Payer: Self-pay | Admitting: Physician Assistant

## 2015-04-01 DIAGNOSIS — N762 Acute vulvitis: Secondary | ICD-10-CM | POA: Diagnosis not present

## 2015-04-01 DIAGNOSIS — Z113 Encounter for screening for infections with a predominantly sexual mode of transmission: Secondary | ICD-10-CM | POA: Diagnosis not present

## 2015-04-01 MED ORDER — AZITHROMYCIN 250 MG PO TABS: ORAL_TABLET | ORAL | Status: AC

## 2015-04-01 MED ORDER — PREDNISONE 20 MG PO TABS: ORAL_TABLET | ORAL | Status: DC

## 2015-04-13 ENCOUNTER — Other Ambulatory Visit: Payer: Self-pay | Admitting: Internal Medicine

## 2015-04-21 DIAGNOSIS — Z0000 Encounter for general adult medical examination without abnormal findings: Secondary | ICD-10-CM | POA: Diagnosis not present

## 2015-04-21 DIAGNOSIS — R3121 Asymptomatic microscopic hematuria: Secondary | ICD-10-CM | POA: Diagnosis not present

## 2015-04-21 DIAGNOSIS — N952 Postmenopausal atrophic vaginitis: Secondary | ICD-10-CM | POA: Diagnosis not present

## 2015-04-24 ENCOUNTER — Other Ambulatory Visit: Payer: Self-pay | Admitting: Physician Assistant

## 2015-04-28 ENCOUNTER — Other Ambulatory Visit: Payer: Self-pay | Admitting: Internal Medicine

## 2015-04-29 DIAGNOSIS — N762 Acute vulvitis: Secondary | ICD-10-CM | POA: Diagnosis not present

## 2015-05-08 DIAGNOSIS — Z0000 Encounter for general adult medical examination without abnormal findings: Secondary | ICD-10-CM | POA: Diagnosis not present

## 2015-05-08 DIAGNOSIS — N952 Postmenopausal atrophic vaginitis: Secondary | ICD-10-CM | POA: Diagnosis not present

## 2015-05-08 DIAGNOSIS — R3121 Asymptomatic microscopic hematuria: Secondary | ICD-10-CM | POA: Diagnosis not present

## 2015-05-08 DIAGNOSIS — R3129 Other microscopic hematuria: Secondary | ICD-10-CM | POA: Diagnosis not present

## 2015-05-27 DIAGNOSIS — Z0000 Encounter for general adult medical examination without abnormal findings: Secondary | ICD-10-CM | POA: Diagnosis not present

## 2015-05-27 DIAGNOSIS — R3121 Asymptomatic microscopic hematuria: Secondary | ICD-10-CM | POA: Diagnosis not present

## 2015-05-27 DIAGNOSIS — N3000 Acute cystitis without hematuria: Secondary | ICD-10-CM | POA: Diagnosis not present

## 2015-05-27 DIAGNOSIS — N952 Postmenopausal atrophic vaginitis: Secondary | ICD-10-CM | POA: Diagnosis not present

## 2015-05-28 ENCOUNTER — Encounter: Payer: Self-pay | Admitting: Internal Medicine

## 2015-05-28 ENCOUNTER — Ambulatory Visit (INDEPENDENT_AMBULATORY_CARE_PROVIDER_SITE_OTHER): Payer: Medicare Other | Admitting: Internal Medicine

## 2015-05-28 VITALS — BP 110/72 | HR 56 | Temp 97.3°F | Resp 16 | Ht 64.5 in | Wt 182.2 lb

## 2015-05-28 DIAGNOSIS — E782 Mixed hyperlipidemia: Secondary | ICD-10-CM | POA: Diagnosis not present

## 2015-05-28 DIAGNOSIS — Z79899 Other long term (current) drug therapy: Secondary | ICD-10-CM | POA: Diagnosis not present

## 2015-05-28 DIAGNOSIS — R7309 Other abnormal glucose: Secondary | ICD-10-CM | POA: Diagnosis not present

## 2015-05-28 DIAGNOSIS — E559 Vitamin D deficiency, unspecified: Secondary | ICD-10-CM

## 2015-05-28 DIAGNOSIS — I10 Essential (primary) hypertension: Secondary | ICD-10-CM | POA: Diagnosis not present

## 2015-05-28 LAB — CBC WITH DIFFERENTIAL/PLATELET
Basophils Absolute: 57 cells/uL (ref 0–200)
Basophils Relative: 1 %
Eosinophils Absolute: 285 cells/uL (ref 15–500)
Eosinophils Relative: 5 %
HCT: 42.4 % (ref 35.0–45.0)
Hemoglobin: 14.1 g/dL (ref 11.7–15.5)
Lymphocytes Relative: 31 %
Lymphs Abs: 1767 cells/uL (ref 850–3900)
MCH: 31.2 pg (ref 27.0–33.0)
MCHC: 33.3 g/dL (ref 32.0–36.0)
MCV: 93.8 fL (ref 80.0–100.0)
MPV: 9.7 fL (ref 7.5–12.5)
Monocytes Absolute: 513 cells/uL (ref 200–950)
Monocytes Relative: 9 %
Neutro Abs: 3078 cells/uL (ref 1500–7800)
Neutrophils Relative %: 54 %
Platelets: 203 10*3/uL (ref 140–400)
RBC: 4.52 MIL/uL (ref 3.80–5.10)
RDW: 14 % (ref 11.0–15.0)
WBC: 5.7 10*3/uL (ref 3.8–10.8)

## 2015-05-28 NOTE — Progress Notes (Signed)
Patient ID: Kelsey Lopez, female   DOB: January 20, 1941, 75 y.o.   MRN: [id number]   This very nice 75 y.o.  [facility] presents for follow up with Hypertension, Hyperlipidemia, Pre-Diabetes and Vitamin D Deficiency.    Patient is treated for HTN & BP has been controlled at home. Today's BP: 110/72 mmHg. Patient has had no complaints of any cardiac type chest pain, palpitations, dyspnea/orthopnea/PND, dizziness, claudication, or dependent edema.   Hyperlipidemia is controlled with diet & meds. Patient denies myalgias or other med SE's. Last Lipids were at goal with Cholesterol 187; HDL 55; LDL 92; Triglycerides 202 on 02/27/2015.   Also, the patient has history of PreDiabetes and has had no symptoms of reactive hypoglycemia, diabetic polys, paresthesias or visual blurring.  Last A1c was  Bld 5.7% on 02/27/2015.   Further, the patient also has history of Vitamin D Deficiency and supplements vitamin D without any suspected side-effects. Last vitamin D was 80 on 02/27/2015.  Medication Sig  . ALPRAZolam (XANAX) 0.5 MG tablet TAKE ONE-HALF TO ONE TABLET BY MOUTH THREE TIMES DAILY AS NEEDED  . aspirin 81 MG EC tablet Take 81 mg by mouth daily.    . cetirizine (ZYRTEC) 10 MG tablet Take 10 mg by mouth as needed.    Marland Kitchen VITAMIN D 2000 UNITS  Take 2,000 Units by mouth daily.    . Citalopram 40 MG  TAKE ONE TABLET BY MOUTH DAILY  . [facility] CRANBERRY 475 MG  Take 1 capsule by mouth daily.    . fish oil-omega-3  1000 MG  Take 2 g by mouth daily.    Marland Kitchen FLAX SEED OIL 1000 MG  Take 1,000 mg by mouth daily.  . hctz 12.5 MG cap TAKE ONE CAPSULE BY MOUTH ONE TO TWO TIMES DAILY  . hydrocortisone  1 % CREA Apply once daily as needed on hemorrhoid  . Magnesium 400 MG CAPS Take 400 mg by mouth daily.  . montelukast  10 MG tablet TAKE ONE TABLET BY MOUTH ONE TIME DAILY  . Multiple Vitamin  Take 1 tablet by mouth daily.    . Calcium 1800 mg  1 tablet daily   . Mucinex   1 tab daily  . simvastatin  40 MG tablet TAKE ONE TABLET BY  MOUTH AT BEDTIME  . traZODone  150 MG tablet TAKE ONE-HALF TO ONE TABLET BY MOUTH ONE HOUR PRIOR TO BEDTIME  . valsartan  320 MG tablet TAKE 1 TABLET (320 MG TOTAL) BY MOUTH DAILY.  . hydrocortisone cream 1 % APPLY ONCE DAILY AS NEEDED ON HEMORRHOID  . TERAZOL 7 0.4 % vaginal cream Place 1 applicator vaginally at bedtime.   Allergies  Allergen Reactions  . Ace Inhibitors     Cough  . Ampicillin     rash  . Mevacor [Lovastatin]     Elevated LFT's  . Penicillins     REACTION: hives and  itching  . Sulfa Antibiotics     rash   PMHx:   Past Medical History  Diagnosis Date  . Dyslipidemia     on Rx x20 years  . HTN (hypertension)   . Multiple allergies   . PVC (premature ventricular contraction)   . Hyperlipidemia   . Anxiety   . Insomnia   . Morton's neuroma of left foot   . Osteoarthritis   . Polycythemia    Immunization History  Administered Date(s) Administered  . Influenza Split 11/15/2011, 11/28/2012, 10/31/2013, 01/07/2014  . Influenza, High Dose Seasonal PF 11/27/2014  .  Pneumococcal Conjugate-13 10/02/2013  . Tdap 08/01/2013  . Zoster 05/25/2011   Past Surgical History  Procedure Laterality Date  . Tonsillectomy    . Vesicovaginal fistula closure w/ tah    . Knee arthroscopy      R  . Abdominal hysterectomy     FHx:    Reviewed / unchanged  SHx:    Reviewed / unchanged  Systems Review:  Constitutional: Denies fever, chills, wt changes, headaches, insomnia, fatigue, night sweats, change in appetite. Eyes: Denies redness, blurred vision, diplopia, discharge, itchy, watery eyes.  ENT: Denies discharge, congestion, post nasal drip, epistaxis, sore throat, earache, hearing loss, dental pain, tinnitus, vertigo, sinus pain, snoring.  CV: Denies chest pain, palpitations, irregular heartbeat, syncope, dyspnea, diaphoresis, orthopnea, PND, claudication or edema. Respiratory: denies cough, dyspnea, DOE, pleurisy, hoarseness, laryngitis, wheezing.   Gastrointestinal: Denies dysphagia, odynophagia, heartburn, reflux, water brash, abdominal pain or cramps, nausea, vomiting, bloating, diarrhea, constipation, hematemesis, melena, hematochezia  or hemorrhoids. Genitourinary: Denies dysuria, frequency, urgency, nocturia, hesitancy, discharge, hematuria or flank pain. Musculoskeletal: Denies arthralgias, myalgias, stiffness, jt. swelling, pain, limping or strain/sprain.  Skin: Denies pruritus, rash, hives, warts, acne, eczema or change in skin lesion(s). Neuro: No weakness, tremor, incoordination, spasms, paresthesia or pain. Psychiatric: Denies confusion, memory loss or sensory loss. Endo: Denies change in weight, skin or hair change.  Heme/Lymph: No excessive bleeding, bruising or enlarged lymph nodes.  Physical Exam  BP 110/72  Pulse 56  Temp 97.3 F   Resp 16  Ht 5' 4.5"   Wt 182 lb 3.2 oz   BMI 30.80   Appears well nourished and in no distress. Eyes: PERRLA, EOMs, conjunctiva no swelling or erythema. Sinuses: No frontal/maxillary tenderness ENT/Mouth: EAC's clear, TM's nl w/o erythema, bulging. Nares clear w/o erythema, swelling, exudates. Oropharynx clear without erythema or exudates. Oral hygiene is good. Tongue normal, non obstructing. Hearing intact.  Neck: Supple. Thyroid nl. Car 2+/2+ without bruits, nodes or JVD. Chest: Respirations nl with BS clear & equal w/o rales, rhonchi, wheezing or stridor.  Cor: Heart sounds normal w/ regular rate and rhythm without sig. murmurs, gallops, clicks, or rubs. Peripheral pulses normal and equal  without edema.  Abdomen: Soft & bowel sounds normal. Non-tender w/o guarding, rebound, hernias, masses, or organomegaly.  Lymphatics: Unremarkable.  Musculoskeletal: Full ROM all peripheral extremities, joint stability, 5/5 strength, and normal gait.  Skin: Warm, dry without exposed rashes, lesions or ecchymosis apparent.  Neuro: Cranial nerves intact, reflexes equal bilaterally. Sensory-motor  testing grossly intact. Tendon reflexes grossly intact.  Pysch: Alert & oriented x 3.  Insight and judgement nl & appropriate. No ideations.  Assessment and Plan:  1. Essential hypertension  - TSH  2. Mixed hyperlipidemia  - Lipid panel - TSH  3. Abnormal blood sugar  - Hemoglobin A1c - Insulin, random  4. Vitamin D deficiency  - VITAMIN D 25 Hydroxy   5. Medication management  - CBC with Differential/Platelet - BASIC METABOLIC PANEL WITH GFR - Hepatic function panel - Magnesium   Recommended regular exercise, BP monitoring, weight control, and discussed med and SE's. Recommended labs to assess and monitor clinical status. Further disposition pending results of labs. Over 30 minutes of exam, counseling, chart review was performed

## 2015-05-28 NOTE — Patient Instructions (Signed)

## 2015-05-29 LAB — INSULIN, RANDOM: Insulin: 3.4 u[IU]/mL (ref 2.0–19.6)

## 2015-05-29 LAB — HEPATIC FUNCTION PANEL
ALT: 19 U/L (ref 6–29)
AST: 21 U/L (ref 10–35)
Albumin: 4 g/dL (ref 3.6–5.1)
Alkaline Phosphatase: 68 U/L (ref 33–130)
Bilirubin, Direct: 0.1 mg/dL (ref <=0.2)
Indirect Bilirubin: 0.4 mg/dL (ref 0.2–1.2)
Total Bilirubin: 0.5 mg/dL (ref 0.2–1.2)
Total Protein: 6.1 g/dL (ref 6.1–8.1)

## 2015-05-29 LAB — HEMOGLOBIN A1C
Hgb A1c MFr Bld: 5.3 % (ref <5.7)
Mean Plasma Glucose: 105 mg/dL

## 2015-05-29 LAB — TSH: TSH: 1.98 mIU/L

## 2015-05-29 LAB — MAGNESIUM: Magnesium: 1.8 mg/dL (ref 1.5–2.5)

## 2015-05-29 LAB — LIPID PANEL
Cholesterol: 185 mg/dL (ref 125–200)
HDL: 65 mg/dL (ref >=46)
LDL Cholesterol: 98 mg/dL (ref <130)
Total CHOL/HDL Ratio: 2.8 Ratio (ref <=5.0)
Triglycerides: 108 mg/dL (ref <150)
VLDL: 22 mg/dL (ref <30)

## 2015-05-29 LAB — BASIC METABOLIC PANEL WITH GFR
BUN: 9 mg/dL (ref 7–25)
CO2: 29 mmol/L (ref 20–31)
Calcium: 9.2 mg/dL (ref 8.6–10.4)
Chloride: 97 mmol/L — ABNORMAL LOW (ref 98–110)
Creat: 0.67 mg/dL (ref 0.60–0.93)
GFR, Est African American: >89 mL/min (ref >=60)
GFR, Est Non African American: 87 mL/min (ref >=60)
Glucose, Bld: 83 mg/dL (ref 65–99)
Potassium: 3.8 mmol/L (ref 3.5–5.3)
Sodium: 136 mmol/L (ref 135–146)

## 2015-05-29 LAB — VITAMIN D 25 HYDROXY (VIT D DEFICIENCY, FRACTURES): Vit D, 25-Hydroxy: 75 ng/mL (ref 30–100)

## 2015-06-08 ENCOUNTER — Other Ambulatory Visit: Payer: Self-pay | Admitting: Internal Medicine

## 2015-06-10 DIAGNOSIS — N3000 Acute cystitis without hematuria: Secondary | ICD-10-CM | POA: Diagnosis not present

## 2015-06-10 DIAGNOSIS — N3030 Trigonitis without hematuria: Secondary | ICD-10-CM | POA: Diagnosis not present

## 2015-06-10 DIAGNOSIS — R3121 Asymptomatic microscopic hematuria: Secondary | ICD-10-CM | POA: Diagnosis not present

## 2015-06-10 DIAGNOSIS — Z0000 Encounter for general adult medical examination without abnormal findings: Secondary | ICD-10-CM | POA: Diagnosis not present

## 2015-06-16 DIAGNOSIS — L821 Other seborrheic keratosis: Secondary | ICD-10-CM | POA: Diagnosis not present

## 2015-06-16 DIAGNOSIS — D485 Neoplasm of uncertain behavior of skin: Secondary | ICD-10-CM | POA: Diagnosis not present

## 2015-06-16 DIAGNOSIS — L919 Hypertrophic disorder of the skin, unspecified: Secondary | ICD-10-CM | POA: Diagnosis not present

## 2015-06-16 DIAGNOSIS — D1801 Hemangioma of skin and subcutaneous tissue: Secondary | ICD-10-CM | POA: Diagnosis not present

## 2015-06-25 ENCOUNTER — Other Ambulatory Visit: Payer: Self-pay | Admitting: Internal Medicine

## 2015-06-26 NOTE — Telephone Encounter (Signed)
[facility] CALLED [facility]

## 2015-07-20 ENCOUNTER — Other Ambulatory Visit: Payer: Self-pay | Admitting: Physician Assistant

## 2015-07-20 DIAGNOSIS — F419 Anxiety disorder, unspecified: Secondary | ICD-10-CM

## 2015-08-13 ENCOUNTER — Other Ambulatory Visit: Payer: Self-pay | Admitting: *Deleted

## 2015-08-13 MED ORDER — SIMVASTATIN 40 MG PO TABS: 40.0000 mg | ORAL_TABLET | Freq: Every day | ORAL | Status: DC

## 2015-08-22 ENCOUNTER — Encounter: Payer: Self-pay | Admitting: Internal Medicine

## 2015-08-22 ENCOUNTER — Ambulatory Visit (INDEPENDENT_AMBULATORY_CARE_PROVIDER_SITE_OTHER): Payer: Medicare Other | Admitting: Internal Medicine

## 2015-08-22 VITALS — BP 130/72 | HR 80 | Temp 97.7°F | Resp 16 | Ht 66.0 in | Wt 180.8 lb

## 2015-08-22 DIAGNOSIS — E782 Mixed hyperlipidemia: Secondary | ICD-10-CM

## 2015-08-22 DIAGNOSIS — Z136 Encounter for screening for cardiovascular disorders: Secondary | ICD-10-CM

## 2015-08-22 DIAGNOSIS — Z0001 Encounter for general adult medical examination with abnormal findings: Secondary | ICD-10-CM

## 2015-08-22 DIAGNOSIS — J449 Chronic obstructive pulmonary disease, unspecified: Secondary | ICD-10-CM

## 2015-08-22 DIAGNOSIS — E559 Vitamin D deficiency, unspecified: Secondary | ICD-10-CM

## 2015-08-22 DIAGNOSIS — R21 Rash and other nonspecific skin eruption: Secondary | ICD-10-CM

## 2015-08-22 DIAGNOSIS — Z0000 Encounter for general adult medical examination without abnormal findings: Secondary | ICD-10-CM | POA: Diagnosis not present

## 2015-08-22 DIAGNOSIS — Z1212 Encounter for screening for malignant neoplasm of rectum: Secondary | ICD-10-CM

## 2015-08-22 DIAGNOSIS — I10 Essential (primary) hypertension: Secondary | ICD-10-CM | POA: Diagnosis not present

## 2015-08-22 DIAGNOSIS — E669 Obesity, unspecified: Secondary | ICD-10-CM

## 2015-08-22 DIAGNOSIS — R6889 Other general symptoms and signs: Secondary | ICD-10-CM | POA: Diagnosis not present

## 2015-08-22 DIAGNOSIS — Z79899 Other long term (current) drug therapy: Secondary | ICD-10-CM

## 2015-08-22 DIAGNOSIS — J4489 Other specified chronic obstructive pulmonary disease: Secondary | ICD-10-CM

## 2015-08-22 DIAGNOSIS — R7309 Other abnormal glucose: Secondary | ICD-10-CM | POA: Diagnosis not present

## 2015-08-22 LAB — TSH: TSH: 2.54 mIU/L

## 2015-08-22 LAB — CBC WITH DIFFERENTIAL/PLATELET
Basophils Absolute: 59 cells/uL (ref 0–200)
Basophils Relative: 1 %
Eosinophils Absolute: 177 cells/uL (ref 15–500)
Eosinophils Relative: 3 %
HCT: 42.2 % (ref 35.0–45.0)
Hemoglobin: 14.7 g/dL (ref 11.7–15.5)
Lymphocytes Relative: 29 %
Lymphs Abs: 1711 cells/uL (ref 850–3900)
MCH: 32.3 pg (ref 27.0–33.0)
MCHC: 34.8 g/dL (ref 32.0–36.0)
MCV: 92.7 fL (ref 80.0–100.0)
MPV: 9.5 fL (ref 7.5–12.5)
Monocytes Absolute: 531 cells/uL (ref 200–950)
Monocytes Relative: 9 %
Neutro Abs: 3422 cells/uL (ref 1500–7800)
Neutrophils Relative %: 58 %
Platelets: 208 10*3/uL (ref 140–400)
RBC: 4.55 MIL/uL (ref 3.80–5.10)
RDW: 13.4 % (ref 11.0–15.0)
WBC: 5.9 10*3/uL (ref 3.8–10.8)

## 2015-08-22 LAB — HEPATIC FUNCTION PANEL
ALT: 18 U/L (ref 6–29)
AST: 22 U/L (ref 10–35)
Albumin: 3.9 g/dL (ref 3.6–5.1)
Alkaline Phosphatase: 63 U/L (ref 33–130)
Bilirubin, Direct: 0.1 mg/dL (ref <=0.2)
Indirect Bilirubin: 0.6 mg/dL (ref 0.2–1.2)
Total Bilirubin: 0.7 mg/dL (ref 0.2–1.2)
Total Protein: 6.4 g/dL (ref 6.1–8.1)

## 2015-08-22 LAB — HEMOGLOBIN A1C
Hgb A1c MFr Bld: 5.4 % (ref <5.7)
Mean Plasma Glucose: 108 mg/dL

## 2015-08-22 LAB — BASIC METABOLIC PANEL WITH GFR
BUN: 8 mg/dL (ref 7–25)
CO2: 28 mmol/L (ref 20–31)
Calcium: 10 mg/dL (ref 8.6–10.4)
Chloride: 96 mmol/L — ABNORMAL LOW (ref 98–110)
Creat: 0.54 mg/dL — ABNORMAL LOW (ref 0.60–0.93)
GFR, Est African American: >89 mL/min (ref >=60)
GFR, Est Non African American: >89 mL/min (ref >=60)
Glucose, Bld: 87 mg/dL (ref 65–99)
Potassium: 3.6 mmol/L (ref 3.5–5.3)
Sodium: 136 mmol/L (ref 135–146)

## 2015-08-22 LAB — LIPID PANEL
Cholesterol: 177 mg/dL (ref 125–200)
HDL: 80 mg/dL (ref >=46)
LDL Cholesterol: 80 mg/dL (ref <130)
Total CHOL/HDL Ratio: 2.2 Ratio (ref <=5.0)
Triglycerides: 85 mg/dL (ref <150)
VLDL: 17 mg/dL (ref <30)

## 2015-08-22 LAB — MAGNESIUM: Magnesium: 1.8 mg/dL (ref 1.5–2.5)

## 2015-08-22 NOTE — Progress Notes (Signed)
Patient ID: Kelsey Lopez, female   DOB: 02/14/1940, 75 y.o.   MRN: [id number]  [facility]                   Unk Pinto, M.D.    Uvaldo Bristle. Silverio Lay, P.A.-C      Starlyn Skeans, P.A.-C   [facility]                [address] [place], [place] [id number] Telephone [phone] Telefax [phone]  ______________________________________________________________________  Annual Screening/Preventative Visit And Comprehensive Evaluation &  Examination         This very nice 75 y.o. [facility] presents for a Wellness/Preventative Visit & comprehensive evaluation and management of multiple medical co-morbidities.  Patient has been followed for HTN, Prediabetes, Hyperlipidemia and Vitamin D Deficiency.      HTN predates since 1996. Patient's BP has been controlled at home and patient denies any cardiac symptoms as chest pain, palpitations, shortness of breath, dizziness or ankle swelling. Today's BP is 130/72 mmHg.      Patient's hyperlipidemia is controlled with diet and medications. Patient denies myalgias or other medication SE's. Last lipids were at goal with  Cholesterol 185; HDL 65; LDL 98; Triglycerides 108 on 05/28/2015.      Patient has prediabetes predating since 07/2013 with A1c 5.8% and patient denies reactive hypoglycemic symptoms, visual blurring, diabetic polys, or paresthesias. Last A1c was  5.3% on 05/28/2015.     Finally, patient has history of Vitamin D Deficiency and last Vitamin D was 75 on 05/28/2015.    Medication Sig  . ALPRAZolam  0.5 MG tablet TAKE 1/2 TO 1 TABLET BY MOUTH THREE TIMES A DAY AS NEEDED  . aspirin 81 MG EC tablet Take 81 mg by mouth daily.    . cetirizine  10 MG tablet Take 10 mg by mouth as needed.    Marland Kitchen VITAMIN D 2000 UNITS  Take 2,000 Units by mouth daily.    . citalopram  40 MG tablet TAKE ONE TABLET BY MOUTH DAILY  . PREMARIN vaginal cream Place 1 Applicatorful vaginally.  Use 3 days a week.  . Cranberry  475 MG CAPS Take 1 capsule by mouth daily.    . fish oil-omega-3  1000 MG  Take 2 g by mouth daily.    Marland Kitchen FLAX SEED OIL 1000 MG  Take 1,000 mg by mouth daily.  . hctz 12.5 MG cap TAKE ONE CAPSULE BY MOUTH ONE TO TWO TIMES DAILY  . Magnesium 400 MG CAPS Take 400 mg by mouth daily.  . montelukast 10 MG tablet TAKE ONE TABLET BY MOUTH ONE TIME DAILY  . Multiple Vitamin Take 1 tablet by mouth daily.    Marland Kitchen MACROBID 100 MG cap Take 100 mg by mouth 2 (two) times daily. Per Dr Jeffie Pollock  . Calcium 1800 mg  1 tablet daily   . Otc Mucinex  1 tab daily  . simvastatin  40 MG tablet Take 1 tablet (40 mg total) by mouth at bedtime.  . traZODone 150 MG tablet TAKE ONE-HALF TO ONE TABLET BY MOUTH ONE HOUR PRIOR TO BEDTIME  . valsartan  320 MG tablet TAKE 1 TABLET (320 MG TOTAL) BY MOUTH DAILY.   Allergies  Allergen Reactions  . Ace Inhibitors     Cough  . Ampicillin     rash  . Mevacor [Lovastatin]  Elevated LFT's  . Penicillins     REACTION: hives and  itching  . Sulfa Antibiotics     rash   Past Medical History  Diagnosis Date  . Dyslipidemia     on Rx x20 years  . HTN (hypertension)   . Multiple allergies   . PVC (premature ventricular contraction)   . Hyperlipidemia   . Anxiety   . Insomnia   . Morton's neuroma of left foot   . Osteoarthritis   . Polycythemia    Health Maintenance  Topic Date Due  . MAMMOGRAM  07/27/2015  . PNA vac Low Risk Adult (2 of 2 - PPSV23) 07/22/2016 (Originally 10/03/2014)  . INFLUENZA VACCINE  09/09/2015  . COLONOSCOPY  05/18/2022  . TETANUS/TDAP  08/02/2023  . DEXA SCAN  Completed  . ZOSTAVAX  Completed   Immunization History  Administered Date(s) Administered  . Influenza Split 11/15/2011, 11/28/2012, 10/31/2013, 01/07/2014  . Influenza, High Dose Seasonal PF 11/27/2014  . Pneumococcal Conjugate-13 10/02/2013  . Tdap 08/01/2013  . Zoster 05/25/2011   Past Surgical History  Procedure Laterality Date  .  Tonsillectomy    . Vesicovaginal fistula closure w/ tah    . Knee arthroscopy      R  . Abdominal hysterectomy     Family History  Problem Relation Age of Onset  . Osteoarthritis Mother   . Heart attack Mother   . Heart disease Mother   . Cancer Father     lung  . Osteoarthritis Father   . Hyperlipidemia Brother   . Diabetes Brother    Social History  Substance Use Topics  . Smoking status: Never Smoker   . Smokeless tobacco: None     Comment: no tobacco   . Alcohol Use: Yes     Comment: rare    ROS Constitutional: Denies fever, chills, weight loss/gain, headaches, insomnia,  night sweats, and change in appetite. Does c/o fatigue. Eyes: Denies redness, blurred vision, diplopia, discharge, itchy, watery eyes.  ENT: Denies discharge, congestion, post nasal drip, epistaxis, sore throat, earache, hearing loss, dental pain, Tinnitus, Vertigo, Sinus pain, snoring.  Cardio: Denies chest pain, palpitations, irregular heartbeat, syncope, dyspnea, diaphoresis, orthopnea, PND, claudication, edema Respiratory: denies cough, dyspnea, DOE, pleurisy, hoarseness, laryngitis, wheezing.  Gastrointestinal: Denies dysphagia, heartburn, reflux, water brash, pain, cramps, nausea, vomiting, bloating, diarrhea, constipation, hematemesis, melena, hematochezia, jaundice, hemorrhoids Genitourinary: Denies dysuria, frequency, urgency, nocturia, hesitancy, discharge, hematuria, flank pain Breast: Breast lumps, nipple discharge, bleeding.  Musculoskeletal: Denies arthralgia, myalgia, stiffness, Jt. Swelling, pain, limp, and strain/sprain. Denies falls. Skin: Denies puritis, rash, hives, warts, acne, eczema, changing in skin lesion Neuro: No weakness, tremor, incoordination, spasms, paresthesia, pain Psychiatric: Denies confusion, memory loss, sensory loss. Denies Depression. Endocrine: Denies change in weight, skin, hair change, nocturia, and paresthesia, diabetic polys, visual blurring, hyper / hypo  glycemic episodes.  Heme/Lymph: No excessive bleeding, bruising, enlarged lymph nodes.  Physical Exam  BP 130/72 mmHg  Pulse 80  Temp(Src) 97.7 F (36.5 C) (Temporal)  Resp 16  Ht 5\' 6"  (1.676 m)  Wt 180 lb 12.8 oz (82.01 kg)  BMI 29.20 kg/m2  SpO2 97%  General Appearance: Well nourished appearing younger than stated age and in no apparent distress.  Eyes: PERRLA, EOMs, conjunctiva no swelling or erythema, normal fundi and vessels. Sinuses: No frontal/maxillary tenderness ENT/Mouth: EACs patent / TMs  nl. Nares clear without erythema, swelling, mucoid exudates. Oral hygiene is good. No erythema, swelling, or exudate. Tongue normal, non-obstructing. Tonsils not swollen or erythematous.  Hearing normal.  Neck: Supple, thyroid normal. No bruits, nodes or JVD. Respiratory: Respiratory effort normal.  BS equal and clear bilateral without rales, rhonci, wheezing or stridor. Cardio: Heart sounds are normal with regular rate and rhythm and no murmurs, rubs or gallops. Peripheral pulses are normal and equal bilaterally without edema. No aortic or femoral bruits. Chest: symmetric with normal excursions and percussion. Breasts: Symmetric, without lumps, nipple discharge, retractions, or fibrocystic changes.  Abdomen: Flat, soft with bowel sounds active. Nontender, no guarding, rebound, hernias, masses, or organomegaly.  Lymphatics: Non tender without lymphadenopathy.  Genitourinary:  Musculoskeletal: Full ROM all peripheral extremities, joint stability, 5/5 strength, and normal gait. Skin: Warm and dry without rashes, lesions, cyanosis, clubbing or  ecchymosis.  Neuro: Cranial nerves intact, reflexes equal bilaterally. Normal muscle tone, no cerebellar symptoms. Sensation intact.  Pysch: Alert and oriented X 3, normal affect, Insight and Judgment appropriate.   Assessment and Plan  1. Annual Preventative Screening Examination  - Microalbumin / creatinine urine ratio - EKG 12-Lead - [place],  RETROPERITNL ABD,  LTD - POC Hemoccult Bld/Stl - Urinalysis, Routine w reflex microscopic  - CBC with Differential/Platelet - BASIC METABOLIC PANEL WITH GFR - Hepatic function panel - Magnesium - Lipid panel - TSH - Hemoglobin A1c - Insulin, random - VITAMIN D 25 Hydroxy   2. Essential hypertension  - Microalbumin / creatinine urine ratio - EKG 12-Lead - [place], RETROPERITNL ABD,  LTD - TSH  3. Mixed hyperlipidemia  - Lipid panel - TSH  4. Abnormal blood sugar  - Hemoglobin A1c - Insulin, random  5. Vitamin D deficiency  - VITAMIN D 25 Hydroxy  6. Obesity   7. COPD  ([place])   8. Screening for rectal cancer  - POC Hemoccult Bld/Stl   9. Medication management  - Urinalysis, Routine w reflex microscopic  - CBC with Differential/Platelet - BASIC METABOLIC PANEL WITH GFR - Hepatic function panel - Magnesium   Continue prudent diet as discussed, weight control, BP monitoring, regular exercise, and medications. Discussed med's effects and SE's. Screening labs and tests as requested with regular follow-up as recommended. Over 40 minutes of exam, counseling, chart review and high complex critical decision making was performed.

## 2015-08-22 NOTE — Patient Instructions (Signed)
Recommend Adult Low Dose Aspirin or   coated  Aspirin 81 mg daily   To reduce risk of Colon Cancer 20 %,   Skin Cancer 26 % ,   Melanoma 46%   and   Pancreatic cancer 60%   ++++++++++++++++++++++++++++++++++++++++++++++++++++++ Vitamin D goal   is between 70-100.   Please make sure that you are taking your Vitamin D as directed.   It is very important as a natural anti-inflammatory   helping hair, skin, and nails, as well as reducing stroke and heart attack risk.   It helps your bones and helps with mood.  It also decreases numerous cancer risks so please take it as directed.   Low Vit D is associated with a 200-300% higher risk for CANCER   and 200-300% higher risk for HEART   ATTACK  &  STROKE.   .....................................Marland Kitchen  It is also associated with higher death rate at younger ages,   autoimmune diseases like Rheumatoid arthritis, Lupus, Multiple Sclerosis.     Also many other serious conditions, like depression, Alzheimer's  Dementia, infertility, muscle aches, fatigue, fibromyalgia - just to name a few.  ++++++++++++++++++++++++++++++++++++++++++++++++  Recommend the book "The END of DIETING" by Dr Excell Seltzer   & the book "The END of DIABETES " by Dr Excell Seltzer  At [email or web address] - get book & Audio CD's     Being diabetic has a  300% increased risk for heart attack, stroke, cancer, and alzheimer- type vascular dementia. It is very important that you work harder with diet by avoiding all foods that are white. Avoid white rice (brown & wild rice is OK), white potatoes (sweetpotatoes in moderation is OK), White bread or wheat bread or anything made out of white flour like bagels, donuts, rolls, buns, biscuits, cakes, pastries, cookies, pizza crust, and pasta (made from white flour & egg whites) - vegetarian pasta or spinach or wheat pasta is OK. Multigrain breads like Arnold's or Pepperidge [employer], or multigrain sandwich thins or flatbreads.  Diet,  exercise and weight loss can reverse and cure diabetes in the early stages.  Diet, exercise and weight loss is very important in the control and prevention of complications of diabetes which affects every system in your body, ie. Brain - dementia/stroke, eyes - glaucoma/blindness, heart - heart attack/heart failure, kidneys - dialysis, stomach - gastric paralysis, intestines - malabsorption, nerves - severe painful neuritis, circulation - gangrene & loss of a leg(s), and finally cancer and Alzheimers.    I recommend avoid fried & greasy foods,  sweets/candy, white rice (brown or wild rice or Quinoa is OK), white potatoes (sweet potatoes are OK) - anything made from white flour - bagels, doughnuts, rolls, buns, biscuits,white and wheat breads, pizza crust and traditional pasta made of white flour & egg white(vegetarian pasta or spinach or wheat pasta is OK).  Multi-grain bread is OK - like multi-grain flat bread or sandwich thins. Avoid alcohol in excess. Exercise is also important.    Eat all the vegetables you want - avoid meat, especially red meat and dairy - especially cheese.  Cheese is the most concentrated form of trans-fats which is the worst thing to clog up our arteries. Veggie cheese is OK which can be found in the fresh produce section at [employer] or [employer] or [employer]  ++++++++++++++++++++++++++++++++++++++++++++++++++ DASH Eating Plan  DASH stands for "Dietary Approaches to Stop Hypertension."   The DASH eating plan is a healthy eating plan that has been shown to reduce high blood  pressure (hypertension). Additional health benefits may include reducing the risk of type 2 diabetes mellitus, heart disease, and stroke. The DASH eating plan may also help with weight loss.  WHAT DO I NEED TO KNOW ABOUT THE DASH EATING PLAN?  For the DASH eating plan, you will follow these general guidelines:  Choose foods with a percent daily value for sodium of less than 5% (as listed on the food  label).  Use salt-free seasonings or herbs instead of table salt or sea salt.  Check with your health care provider or pharmacist before using salt substitutes.  Eat lower-sodium products, often labeled as "lower sodium" or "no salt added."  Eat fresh foods.  Eat more vegetables, fruits, and low-fat dairy products.    Choose whole grains. Look for the word "whole" as the first word in the ingredient list.  Choose fish   Limit sweets, desserts, sugars, and sugary drinks.  Choose heart-healthy fats.  Eat veggie cheese   Eat more home-cooked food and less restaurant, buffet, and fast food.  Limit fried foods.  [place]uffaker foods using methods other than frying.  Limit canned vegetables. If you do use them, rinse them well to decrease the sodium.  When eating at a restaurant, ask that your food be prepared with less salt, or no salt if possible.                      WHAT FOODS CAN I EAT?  Read Dr Fara Olden Fuhrman's books on The End of Dieting & The End of Diabetes  Grains  Whole grain or whole wheat bread. Brown rice. Whole grain or whole wheat pasta. Quinoa, bulgur, and whole grain cereals. Low-sodium cereals. Corn or whole wheat flour tortillas. Whole grain cornbread. Whole grain crackers. Low-sodium crackers.  Vegetables  Fresh or frozen vegetables (raw, steamed, roasted, or grilled). Low-sodium or reduced-sodium tomato and vegetable juices. Low-sodium or reduced-sodium tomato sauce and paste. Low-sodium or reduced-sodium canned vegetables.   Fruits  All fresh, canned (in natural juice), or frozen fruits.  Protein Products   All fish and seafood.  Dried beans, peas, or lentils. Unsalted nuts and seeds. Unsalted canned beans.  Dairy  Low-fat dairy products, such as skim or 1% milk, 2% or reduced-fat cheeses, low-fat ricotta or cottage cheese, or plain low-fat yogurt. Low-sodium or reduced-sodium cheeses.  Fats and Oils  Tub margarines without trans fats. Light or  reduced-fat mayonnaise and salad dressings (reduced sodium). Avocado. Safflower, olive, or canola oils. Natural peanut or almond butter.  Other  Unsalted popcorn and pretzels. The items listed above may not be a complete list of recommended foods or beverages. Contact your dietitian for more options.  +++++++++++++++++++++++++++++++++++++++++++  WHAT FOODS ARE NOT RECOMMENDED?  Grains/ White flour or wheat flour  White bread. White pasta. White rice. Refined cornbread. Bagels and croissants. Crackers that contain trans fat.  Vegetables  Creamed or fried vegetables. Vegetables in a . Regular canned vegetables. Regular canned tomato sauce and paste. Regular tomato and vegetable juices.  Fruits  Dried fruits. Canned fruit in light or heavy syrup. Fruit juice.  Meat and Other Protein Products  Meat in general - RED mwaet & White meat.  Fatty cuts of meat. Ribs, chicken wings, bacon, sausage, bologna, salami, chitterlings, fatback, hot dogs, bratwurst, and packaged luncheon meats.  Dairy  Whole or 2% milk, cream, half-and-half, and cream cheese. Whole-fat or sweetened yogurt. Full-fat cheeses or blue cheese. Nondairy creamers and whipped toppings. Processed cheese, cheese spreads, or  cheese curds.  Condiments  Onion and garlic salt, seasoned salt, table salt, and sea salt. Canned and packaged gravies. Worcestershire sauce. Tartar sauce. Barbecue sauce. Teriyaki sauce. Soy sauce, including reduced sodium. Steak sauce. Fish sauce. Oyster sauce. Cocktail sauce. Horseradish. Ketchup and mustard. Meat flavorings and tenderizers. Bouillon cubes. Hot sauce. Tabasco sauce. Marinades. Taco seasonings. Relishes.  Fats and Oils Butter, stick margarine, lard, shortening and bacon fat. Coconut, palm kernel, or palm oils. Regular salad dressings.  Pickles and olives. Salted popcorn and pretzels.  The items listed above may not be a complete list of foods and beverages to avoid.   Preventive  Care for Adults  A healthy lifestyle and preventive care can promote health and wellness. Preventive health guidelines for women include the following key practices.  A routine yearly physical is a good way to check with your health care provider about your health and preventive screening. It is a chance to share any concerns and updates on your health and to receive a thorough exam.  Visit your dentist for a routine exam and preventive care every 6 months. Brush your teeth twice a day and floss once a day. Good oral hygiene prevents tooth decay and gum disease.  The frequency of eye exams is based on your age, health, family medical history, use of contact lenses, and other factors. Follow your health care provider's recommendations for frequency of eye exams.  Eat a healthy diet. Foods like vegetables, fruits, whole grains, low-fat dairy products, and lean protein foods contain the nutrients you need without too many calories. Decrease your intake of foods high in solid fats, added sugars, and salt. Eat the right amount of calories for you.Get information about a proper diet from your health care provider, if necessary.  Regular physical exercise is one of the most important things you can do for your health. Most adults should get at least 150 minutes of moderate-intensity exercise (any activity that increases your heart rate and causes you to sweat) each week. In addition, most adults need muscle-strengthening exercises on 2 or more days a week.  Maintain a healthy weight. The body mass index (BMI) is a screening tool to identify possible weight problems. It provides an estimate of body fat based on height and weight. Your health care provider can find your BMI and can help you achieve or maintain a healthy weight.For adults 20 years and older:  A BMI below 18.5 is considered underweight.  A BMI of 18.5 to 24.9 is normal.  A BMI of 25 to 29.9 is considered overweight.  A BMI of 30 and  above is considered obese.  Maintain normal blood lipids and cholesterol levels by exercising and minimizing your intake of saturated fat. Eat a balanced diet with plenty of fruit and vegetables. If your lipid or cholesterol levels are high, you are over 50, or you are at high risk for heart disease, you may need your cholesterol levels checked more frequently.Ongoing high lipid and cholesterol levels should be treated with medicines if diet and exercise are not working.  If you smoke, find out from your health care provider how to quit. If you do not use tobacco, do not start.  Lung cancer screening is recommended for adults aged 55-80 years who are at high risk for developing lung cancer because of a history of smoking. A yearly low-dose CT scan of the lungs is recommended for people who have at least a 30-pack-year history of smoking and are a current smoker or   have quit within the past 15 years. A pack year of smoking is smoking an average of 1 pack of cigarettes a day for 1 year (for example: 1 pack a day for 30 years or 2 packs a day for 15 years). Yearly screening should continue until the smoker has stopped smoking for at least 15 years. Yearly screening should be stopped for people who develop a health problem that would prevent them from having lung cancer treatment.  Avoid use of street drugs. Do not share needles with anyone. Ask for help if you need support or instructions about stopping the use of drugs.  High blood pressure causes heart disease and increases the risk of stroke.  Ongoing high blood pressure should be treated with medicines if weight loss and exercise do not work.  If you are 55-79 years old, ask your health care provider if you should take aspirin to prevent strokes.  Diabetes screening involves taking a blood sample to check your fasting blood sugar level. This should be done once every 3 years, after age 45, if you are within normal weight and without risk factors for  diabetes. Testing should be considered at a younger age or be carried out more frequently if you are overweight and have at least 1 risk factor for diabetes.  Breast cancer screening is essential preventive care for women. You should practice "breast self-awareness." This means understanding the normal appearance and feel of your breasts and may include breast self-examination. Any changes detected, no matter how small, should be reported to a health care provider. Women in their 20s and 30s should have a clinical breast exam (CBE) by a health care provider as part of a regular health exam every 1 to 3 years. After age 40, women should have a CBE every year. Starting at age 40, women should consider having a mammogram (breast X-ray test) every year. Women who have a family history of breast cancer should talk to their health care provider about genetic screening. Women at a high risk of breast cancer should talk to their health care providers about having an MRI and a mammogram every year.  Breast cancer gene (BRCA)-related cancer risk assessment is recommended for women who have family members with BRCA-related cancers. BRCA-related cancers include breast, ovarian, tubal, and peritoneal cancers. Having family members with these cancers may be associated with an increased risk for harmful changes (mutations) in the breast cancer genes BRCA1 and BRCA2. Results of the assessment will determine the need for genetic counseling and BRCA1 and BRCA2 testing.  Routine pelvic exams to screen for cancer are no longer recommended for nonpregnant women who are considered low risk for cancer of the pelvic organs (ovaries, uterus, and vagina) and who do not have symptoms. Ask your health care provider if a screening pelvic exam is right for you.  If you have had past treatment for cervical cancer or a condition that could lead to cancer, you need Pap tests and screening for cancer for at least 20 years after your  treatment. If Pap tests have been discontinued, your risk factors (such as having a new sexual partner) need to be reassessed to determine if screening should be resumed. Some women have medical problems that increase the chance of getting cervical cancer. In these cases, your health care provider may recommend more frequent screening and Pap tests.    Colorectal cancer can be detected and often prevented. Most routine colorectal cancer screening begins at the age of 50 years and   continues through age 75 years. However, your health care provider may recommend screening at an earlier age if you have risk factors for colon cancer. On a yearly basis, your health care provider may provide home test kits to check for hidden blood in the stool. Use of a small camera at the end of a tube, to directly examine the colon (sigmoidoscopy or colonoscopy), can detect the earliest forms of colorectal cancer. Talk to your health care provider about this at age 50, when routine screening begins. Direct exam of the colon should be repeated every 5-10 years through age 75 years, unless early forms of pre-cancerous polyps or small growths are found.  Osteoporosis is a disease in which the bones lose minerals and strength with aging. This can result in serious bone fractures or breaks. The risk of osteoporosis can be identified using a bone density scan. Women ages 65 years and over and women at risk for fractures or osteoporosis should discuss screening with their health care providers. Ask your health care provider whether you should take a calcium supplement or vitamin D to reduce the rate of osteoporosis.  Menopause can be associated with physical symptoms and risks. Hormone replacement therapy is available to decrease symptoms and risks. You should talk to your health care provider about whether hormone replacement therapy is right for you.  Use sunscreen. Apply sunscreen liberally and repeatedly throughout the day. You  should seek shade when your shadow is shorter than you. Protect yourself by wearing long sleeves, pants, a wide-brimmed hat, and sunglasses year round, whenever you are outdoors.  Once a month, do a whole body skin exam, using a mirror to look at the skin on your back. Tell your health care provider of new moles, moles that have irregular borders, moles that are larger than a pencil eraser, or moles that have changed in shape or color.  Stay current with required vaccines (immunizations).  Influenza vaccine. All adults should be immunized every year.  Tetanus, diphtheria, and acellular pertussis (Td, Tdap) vaccine. Pregnant women should receive 1 dose of Tdap vaccine during each pregnancy. The dose should be obtained regardless of the length of time since the last dose. Immunization is preferred during the 27th-36th week of gestation. An adult who has not previously received Tdap or who does not know her vaccine status should receive 1 dose of Tdap. This initial dose should be followed by tetanus and diphtheria toxoids (Td) booster doses every 10 years. Adults with an unknown or incomplete history of completing a 3-dose immunization series with Td-containing vaccines should begin or complete a primary immunization series including a Tdap dose. Adults should receive a Td booster every 10 years.    Zoster vaccine. One dose is recommended for adults aged 60 years or older unless certain conditions are present.    Pneumococcal 13-valent conjugate (PCV13) vaccine. When indicated, a person who is uncertain of her immunization history and has no record of immunization should receive the PCV13 vaccine. An adult aged 19 years or older who has certain medical conditions and has not been previously immunized should receive 1 dose of PCV13 vaccine. This PCV13 should be followed with a dose of pneumococcal polysaccharide (PPSV23) vaccine. The PPSV23 vaccine dose should be obtained at least 8 weeks after the dose  of PCV13 vaccine. An adult aged 19 years or older who has certain medical conditions and previously received 1 or more doses of PPSV23 vaccine should receive 1 dose of PCV13. The PCV13 vaccine dose should   should be obtained 1 or more years after the last PPSV23 vaccine dose.    Pneumococcal polysaccharide (PPSV23) vaccine. When PCV13 is also indicated, PCV13 should be obtained first. All adults aged 21 years and older should be immunized. An adult younger than age 55 years who has certain medical conditions should be immunized. Any person who resides in a nursing home or long-term care facility should be immunized. An adult smoker should be immunized. People with an immunocompromised condition and certain other conditions should receive both PCV13 and PPSV23 vaccines. People with human immunodeficiency virus (HIV) infection should be immunized as soon as possible after diagnosis. Immunization during chemotherapy or radiation therapy should be avoided. Routine use of PPSV23 vaccine is not recommended for American Indians, [place] Natives, or people younger than 65 years unless there are medical conditions that require PPSV23 vaccine. When indicated, people who have unknown immunization and have no record of immunization should receive PPSV23 vaccine. One-time revaccination 5 years after the first dose of PPSV23 is recommended for people aged 19-64 years who have chronic kidney failure, nephrotic syndrome, asplenia, or immunocompromised conditions. People who received 1-2 doses of PPSV23 before age 24 years should receive another dose of PPSV23 vaccine at age 54 years or later if at least 5 years have passed since the previous dose. Doses of PPSV23 are not needed for people immunized with PPSV23 at or after age 41 years.   Preventive Services / Frequency  Ages 25 years and over  Blood pressure check.  Lipid and cholesterol check.  Lung  cancer screening. / Every year if you are aged 49-80 years and have a 30-pack-year history of smoking and currently smoke or have quit within the past 15 years. Yearly screening is stopped once you have quit smoking for at least 15 years or develop a health problem that would prevent you from having lung cancer treatment.  Clinical breast exam.** / Every year after age 86 years.  BRCA-related cancer risk assessment.** / For women who have family members with a BRCA-related cancer (breast, ovarian, tubal, or peritoneal cancers).  Mammogram.** / Every year beginning at age 70 years and continuing for as long as you are in good health. Consult with your health care provider.  Pap test.** / Every 3 years starting at age 73 years through age 38 or 67 years with 3 consecutive normal Pap tests. Testing can be stopped between 65 and 70 years with 3 consecutive normal Pap tests and no abnormal Pap or HPV tests in the past 10 years.  Fecal occult blood test (FOBT) of stool. / Every year beginning at age 82 years and continuing until age 29 years. You may not need to do this test if you get a colonoscopy every 10 years.  Flexible sigmoidoscopy or colonoscopy.** / Every 5 years for a flexible sigmoidoscopy or every 10 years for a colonoscopy beginning at age 42 years and continuing until age 75 years.  Hepatitis C blood test.** / For all people born from [birth year] through 1965 and any individual with known risks for hepatitis C.  Osteoporosis screening.** / A one-time screening for women ages 79 years and over and women at risk for fractures or osteoporosis.  Skin self-exam. / Monthly.  Influenza vaccine. / Every year.  Tetanus, diphtheria, and acellular pertussis (Tdap/Td) vaccine.** / 1 dose of Td every 10 years.  Zoster vaccine.** / 1 dose for adults aged 70 years or older.  Pneumococcal 13-valent conjugate (PCV13) vaccine.** / Consult your health care provider.  Pneumococcal polysaccharide (PPSV23)  vaccine.** / 1 dose for all adults aged 15 years and older. Screening for abdominal aortic aneurysm (AAA)  by ultrasound is recommended for people who have history of high blood pressure or who are current or former smokers.

## 2015-08-23 ENCOUNTER — Encounter: Payer: Self-pay | Admitting: Internal Medicine

## 2015-08-23 ENCOUNTER — Other Ambulatory Visit: Payer: Self-pay | Admitting: Internal Medicine

## 2015-08-23 DIAGNOSIS — N3001 Acute cystitis with hematuria: Secondary | ICD-10-CM

## 2015-08-23 DIAGNOSIS — R319 Hematuria, unspecified: Principal | ICD-10-CM

## 2015-08-23 DIAGNOSIS — N390 Urinary tract infection, site not specified: Secondary | ICD-10-CM

## 2015-08-23 LAB — VITAMIN D 25 HYDROXY (VIT D DEFICIENCY, FRACTURES): Vit D, 25-Hydroxy: 82 ng/mL (ref 30–100)

## 2015-08-23 LAB — URINALYSIS, MICROSCOPIC ONLY
Casts: NONE SEEN [LPF]
Crystals: NONE SEEN [HPF]
Yeast: NONE SEEN [HPF]

## 2015-08-23 LAB — URINALYSIS, ROUTINE W REFLEX MICROSCOPIC
Bilirubin Urine: NEGATIVE
Glucose, UA: NEGATIVE
Ketones, ur: NEGATIVE
Leukocytes, UA: NEGATIVE
Nitrite: NEGATIVE
Specific Gravity, Urine: 1.011 (ref 1.001–1.035)
pH: 7.5 (ref 5.0–8.0)

## 2015-08-23 LAB — MICROALBUMIN / CREATININE URINE RATIO
Creatinine, Urine: 53 mg/dL (ref 20–320)
Microalb Creat Ratio: 875 mcg/mg creat — ABNORMAL HIGH (ref <30)
Microalb, Ur: 46.4 mg/dL

## 2015-08-23 LAB — INSULIN, RANDOM: Insulin: 5.8 u[IU]/mL (ref 2.0–19.6)

## 2015-08-23 MED ORDER — CIPROFLOXACIN HCL 250 MG PO TABS: ORAL_TABLET | ORAL | Status: AC

## 2015-09-16 DIAGNOSIS — N3020 Other chronic cystitis without hematuria: Secondary | ICD-10-CM | POA: Diagnosis not present

## 2015-09-24 ENCOUNTER — Other Ambulatory Visit: Payer: Self-pay | Admitting: *Deleted

## 2015-09-24 DIAGNOSIS — Z0001 Encounter for general adult medical examination with abnormal findings: Secondary | ICD-10-CM

## 2015-09-24 DIAGNOSIS — Z1212 Encounter for screening for malignant neoplasm of rectum: Secondary | ICD-10-CM

## 2015-09-24 LAB — POC HEMOCCULT BLD/STL (HOME/3-CARD/SCREEN)
Card #2 Fecal Occult Blod, POC: NEGATIVE
Card #3 Fecal Occult Blood, POC: NEGATIVE
Fecal Occult Blood, POC: NEGATIVE

## 2015-10-07 ENCOUNTER — Other Ambulatory Visit: Payer: Self-pay | Admitting: Internal Medicine

## 2015-10-23 ENCOUNTER — Other Ambulatory Visit: Payer: Self-pay | Admitting: Internal Medicine

## 2015-10-26 ENCOUNTER — Other Ambulatory Visit: Payer: Self-pay | Admitting: Internal Medicine

## 2015-11-05 ENCOUNTER — Ambulatory Visit: Payer: Medicare Other | Attending: Internal Medicine | Admitting: Physical Therapy

## 2015-11-05 ENCOUNTER — Encounter: Payer: Self-pay | Admitting: Physical Therapy

## 2015-11-05 DIAGNOSIS — M25561 Pain in right knee: Secondary | ICD-10-CM | POA: Insufficient documentation

## 2015-11-05 DIAGNOSIS — M25661 Stiffness of right knee, not elsewhere classified: Secondary | ICD-10-CM | POA: Diagnosis not present

## 2015-11-05 DIAGNOSIS — R262 Difficulty in walking, not elsewhere classified: Secondary | ICD-10-CM | POA: Diagnosis not present

## 2015-11-05 NOTE — Therapy (Signed)
[address] [place], [place], [address] Phone: [phone]   Fax:  [phone]  Physical Therapy Evaluation  Patient Details  Name: Kelsey Lopez MRN: [id number] Date of Birth: [birth year] Referring Provider: Eduard Roux  Encounter Date: 11/05/2015      PT End of Session - 11/05/15 1057    Visit Number 1   Date for PT Re-Evaluation 01/05/16   PT Start Time [id number]   PT Stop Time 1055   PT Time Calculation (min) 40 min   Activity Tolerance Patient tolerated treatment well   Behavior During Therapy [facility] for tasks assessed/performed      Past Medical History:  Diagnosis Date  . Anxiety   . Dyslipidemia    on Rx x20 years  . HTN (hypertension)   . Hyperlipidemia   . Insomnia   . Morton's neuroma of left foot   . Multiple allergies   . Osteoarthritis   . Polycythemia   . PVC (premature ventricular contraction)     Past Surgical History:  Procedure Laterality Date  . ABDOMINAL HYSTERECTOMY    . KNEE ARTHROSCOPY     R  . TONSILLECTOMY    . VESICOVAGINAL FISTULA CLOSURE W/ TAH      There were no vitals filed for this visit.       Subjective Assessment - 11/05/15 1021    Subjective Patient reports that she had a right knee scope about 12 years ago, she reports that over past year she has had more pain and more difficulty going up stairs.  She reports weakness more so with the stairs.   Limitations Walking;House hold activities   Patient Stated Goals be able to go up stairs, less pain   Currently in Pain? Yes   Pain Score 1    Pain Location Knee   Pain Orientation Right   Pain Descriptors / Indicators Aching   Pain Type Chronic pain   Pain Onset More than a month ago   Pain Frequency Intermittent   Aggravating Factors  stairs and at night pain will increase to 5/10   Pain Relieving Factors recent cortisone injection really helped   Effect of Pain on Daily Activities difficulty sleeping,  walking and doing stairs            [facility] PT Assessment - 11/05/15 0001      Assessment   Medical Diagnosis right knee pain   Referring Provider Eduard Roux   Onset Date/Surgical Date 10/05/15   Prior Therapy no     Precautions   Precautions None     Balance Screen   Has the patient fallen in the past 6 months Yes   How many times? 1   Has the patient had a decrease in activity level because of a fear of falling?  No   Is the patient reluctant to leave their home because of a fear of falling?  No     Home Environment   Additional Comments does her ownhousework, no stairs at home but stairs at church     Prior Function   Level of [place] work   [employer] some standing and walking   Leisure o exercise     ROM / Strength   AROM / PROM / Strength AROM;Strength     AROM   Overall AROM Comments AROM of the right knee 7-109 degrees flexion     Strength   Overall Strength Comments  4/5 with bone on bone crepitus with extension     Flexibility   Soft Tissue Assessment /Muscle Length --  some tightness in teh calf     Palpation   Palpation comment bone on bone crepitus with MMT for extension, mild warmth, the knee is in a valgus position, crepitus of patella with SAQ, lateral tracking patella with SAQ     Ambulation/Gait   Gait Comments no device, mild antalgic gait on the right, has valgus L>R                   [facility] Adult PT Treatment/Exercise - 11/05/15 0001      Exercises   Exercises Knee/Hip     Knee/Hip Exercises: Aerobic   Nustep Level 4 x 5 minutes                PT Education - 11/05/15 1057    Education provided Yes   Education Details HEP for LE flexibility, patient tended to be too fast and pull too hard   Person(s) Educated Patient   Methods Explanation;Demonstration;Handout   Comprehension Returned demonstration;Verbalized understanding;Verbal cues required;Need further  instruction          PT Short Term Goals - 11/05/15 1153      PT SHORT TERM GOAL #1   Title independent with initial HEP   Time 1   Period Weeks   Status New           PT Long Term Goals - 11/05/15 1154      PT LONG TERM GOAL #1   Title understand safety in the gym and how to advance   Time 8   Period Weeks   Status New     PT LONG TERM GOAL #2   Title report ability to go up and dwon stairs step over step without pain > 3/10   Time 8   Period Weeks   Status New     PT LONG TERM GOAL #3   Title increase AROM of the right knee to 0-115 degrees flexion   Time 8   Period Weeks   Status New               Plan - 11/05/15 1138    Clinical Impression Statement Patient with right knee pain worse over the past year, to the point where she could not go up and down stairs and having difficulty walking.  She has some significant bone on bone crepitus with MMT into extension.  She has tight LE mms.  She has a large trip planned in April and wants to assure she does not have any problems, patient was impulsive and tended to be too rigorous with stretches   Rehab Potential Good   PT Frequency 1x / week   PT Duration 8 weeks   PT Treatment/Interventions ADLs/Self Care Home Management;Electrical Stimulation;Cryotherapy;Iontophoresis 4mg /ml Dexamethasone;Moist Heat;Ultrasound;Gait training;Stair training;Functional mobility training;Patient/family education;Balance training;Therapeutic exercise;Therapeutic activities;Manual techniques   PT Next Visit Plan Patient will need to advance exercises slowly and will need good HEP and what to do if she goes to a gym   Consulted and Agree with Plan of Care Patient      Patient will benefit from skilled therapeutic intervention in order to improve the following deficits and impairments:  Abnormal gait, Cardiopulmonary status limiting activity, Decreased activity tolerance, Decreased balance, Decreased mobility, Decreased range of  motion, Decreased strength, Increased edema, Difficulty walking, Impaired flexibility, Pain  Visit Diagnosis: Pain in right knee - Plan:  PT plan of care cert/re-cert  Difficulty in walking, not elsewhere classified - Plan: PT plan of care cert/re-cert  Stiffness of right knee, not elsewhere classified - Plan: PT plan of care cert/re-cert      G-Codes - [id number]    Functional Assessment Tool Used foto 59% disability   Functional Limitation Mobility: Walking and moving around   Mobility: Walking and Moving Around Current Status [phone]) At least 40 percent but less than 60 percent impaired, limited or restricted   Mobility: Walking and Moving Around Goal Status [phone]) At least 40 percent but less than 60 percent impaired, limited or restricted       Problem List Patient Active Problem List   Diagnosis Date Noted  . Depression, major, in remission ([place]) 11/27/2014  . Encounter for Medicare annual wellness exam 08/22/2014  . Obesity 04/10/2014  . COPD (chronic obstructive pulmonary disease) with chronic bronchitis ([place]) 04/10/2014  . Insomnia 04/10/2014  . Vitamin D deficiency 01/07/2014  . Abnormal blood sugar 01/03/2014  . Medication management 01/03/2014  . Anxiety   . Morton's neuroma of left foot   . Osteoarthritis   . Polycythemia vera ([place]) 06/05/2009  . Mixed hyperlipidemia 06/05/2009  . Essential hypertension 06/05/2009    Sumner Boast., PT 11/05/2015, 12:03 PM  [place] [address] [address] [place], [place], [address] Phone: [phone]   Fax:  [phone]  Name: Kelsey Lopez MRN: [id number] Date of Birth: 1940/06/09

## 2015-11-08 ENCOUNTER — Other Ambulatory Visit: Payer: Self-pay | Admitting: Internal Medicine

## 2015-11-12 ENCOUNTER — Encounter: Payer: Self-pay | Admitting: Physical Therapy

## 2015-11-12 ENCOUNTER — Ambulatory Visit: Payer: Medicare Other | Attending: Internal Medicine | Admitting: Physical Therapy

## 2015-11-12 DIAGNOSIS — M25561 Pain in right knee: Secondary | ICD-10-CM | POA: Insufficient documentation

## 2015-11-12 DIAGNOSIS — R262 Difficulty in walking, not elsewhere classified: Secondary | ICD-10-CM | POA: Diagnosis not present

## 2015-11-12 DIAGNOSIS — M25661 Stiffness of right knee, not elsewhere classified: Secondary | ICD-10-CM | POA: Diagnosis not present

## 2015-11-12 NOTE — Therapy (Signed)
[address] [place], [place], [address] Phone: [phone]   Fax:  [phone]  Physical Therapy Treatment  Patient Details  Name: Kelsey Lopez MRN: [id number] Date of Birth: 1940-08-30 Referring Provider: Eduard Roux  Encounter Date: 11/12/2015      PT End of Session - 11/12/15 1639    Visit Number 2   Date for PT Re-Evaluation 01/05/16   PT Start Time [id number]   PT Stop Time 1658   PT Time Calculation (min) 49 min   Activity Tolerance Patient tolerated treatment well   Behavior During Therapy [facility] for tasks assessed/performed      Past Medical History:  Diagnosis Date  . Anxiety   . Dyslipidemia    on Rx x20 years  . HTN (hypertension)   . Hyperlipidemia   . Insomnia   . Morton's neuroma of left foot   . Multiple allergies   . Osteoarthritis   . Polycythemia   . PVC (premature ventricular contraction)     Past Surgical History:  Procedure Laterality Date  . ABDOMINAL HYSTERECTOMY    . KNEE ARTHROSCOPY     R  . TONSILLECTOMY    . VESICOVAGINAL FISTULA CLOSURE W/ TAH      There were no vitals filed for this visit.      Subjective Assessment - 11/12/15 1612    Subjective Patient reports that she has been very active and on the go the past week and has not done much exercise, she reports tired and tight   Currently in Pain? Yes   Pain Score 3    Pain Location Knee   Pain Orientation Right;Posterior   Pain Descriptors / Indicators Sore                         [facility] Adult PT Treatment/Exercise - 11/12/15 0001      Knee/Hip Exercises: Stretches   Passive Hamstring Stretch 5 reps;20 seconds   Piriformis Stretch 5 reps;20 seconds   Gastroc Stretch 5 reps;20 seconds   Other Knee/Hip Stretches ITB stretch 5 reps 20 seconds     Knee/Hip Exercises: Aerobic   Nustep Level 4 x 5 minutes     Knee/Hip Exercises: Supine   Short Arc Quad Sets 2 sets;10 reps   Other Supine  Knee/Hip Exercises feet on ball bridges and K2C   Other Supine Knee/Hip Exercises ball squeezes     Modalities   Modalities Electrical Stimulation;Moist Heat     Moist Heat Therapy   Number Minutes Moist Heat 15 Minutes   Moist Heat Location Knee     Electrical Stimulation   Electrical Stimulation Location posterior knees   Electrical Stimulation Action premod   Electrical Stimulation Parameters supine   Electrical Stimulation Goals Pain                  PT Short Term Goals - 11/12/15 1643      PT SHORT TERM GOAL #1   Title independent with initial HEP   Status On-going           PT Long Term Goals - 11/05/15 1154      PT LONG TERM GOAL #1   Title understand safety in the gym and how to advance   Time 8   Period Weeks   Status New     PT LONG TERM GOAL #2   Title report ability to go up and dwon stairs  step over step without pain > 3/10   Time 8   Period Weeks   Status New     PT LONG TERM GOAL #3   Title increase AROM of the right knee to 0-115 degrees flexion   Time 8   Period Weeks   Status New               Plan - 11/12/15 1640    Clinical Impression Statement Pateint has been out of town caring for a friend, she reports she is tired and run down, but overall not in much more pain   PT Next Visit Plan see if patient can try the HEP at home   Consulted and Agree with Plan of Care Patient      Patient will benefit from skilled therapeutic intervention in order to improve the following deficits and impairments:  Abnormal gait, Cardiopulmonary status limiting activity, Decreased activity tolerance, Decreased balance, Decreased mobility, Decreased range of motion, Decreased strength, Increased edema, Difficulty walking, Impaired flexibility, Pain  Visit Diagnosis: Acute pain of right knee  Difficulty in walking, not elsewhere classified  Stiffness of right knee, not elsewhere classified     Problem List Patient Active Problem List    Diagnosis Date Noted  . Depression, major, in remission ([place]) 11/27/2014  . Encounter for Medicare annual wellness exam 08/22/2014  . Obesity 04/10/2014  . COPD (chronic obstructive pulmonary disease) with chronic bronchitis ([place]) 04/10/2014  . Insomnia 04/10/2014  . Vitamin D deficiency 01/07/2014  . Abnormal blood sugar 01/03/2014  . Medication management 01/03/2014  . Anxiety   . Morton's neuroma of left foot   . Osteoarthritis   . Polycythemia vera ([place]) 06/05/2009  . Mixed hyperlipidemia 06/05/2009  . Essential hypertension 06/05/2009    Sumner Boast., PtDictation #1 [id number]  [id number]  11/12/2015, 4:45 PM  [place] [address] [place], [place], [address] Phone: [phone]   Fax:  [phone]  Name: Kelsey Lopez MRN: [id number] Date of Birth: 09/28/1940

## 2015-11-19 ENCOUNTER — Ambulatory Visit: Payer: Medicare Other | Admitting: Physical Therapy

## 2015-11-19 ENCOUNTER — Encounter: Payer: Self-pay | Admitting: Physical Therapy

## 2015-11-19 DIAGNOSIS — M25661 Stiffness of right knee, not elsewhere classified: Secondary | ICD-10-CM | POA: Diagnosis not present

## 2015-11-19 DIAGNOSIS — M25561 Pain in right knee: Secondary | ICD-10-CM | POA: Diagnosis not present

## 2015-11-19 DIAGNOSIS — R262 Difficulty in walking, not elsewhere classified: Secondary | ICD-10-CM

## 2015-11-19 NOTE — Therapy (Signed)
[address] [place], [place], [address] Phone: [phone]   Fax:  [phone]  Physical Therapy Treatment  Patient Details  Name: Kelsey Lopez MRN: [id number] Date of Birth: 1940-06-06 Referring Provider: Eduard Roux  Encounter Date: 11/19/2015      PT End of Session - 11/19/15 1433    Visit Number 3   Date for PT Re-Evaluation 01/05/16   PT Start Time 4650   PT Stop Time 1445   PT Time Calculation (min) 49 min   Activity Tolerance Patient tolerated treatment well   Behavior During Therapy [facility] for tasks assessed/performed      Past Medical History:  Diagnosis Date  . Anxiety   . Dyslipidemia    on Rx x20 years  . HTN (hypertension)   . Hyperlipidemia   . Insomnia   . Morton's neuroma of left foot   . Multiple allergies   . Osteoarthritis   . Polycythemia   . PVC (premature ventricular contraction)     Past Surgical History:  Procedure Laterality Date  . ABDOMINAL HYSTERECTOMY    . KNEE ARTHROSCOPY     R  . TONSILLECTOMY    . VESICOVAGINAL FISTULA CLOSURE W/ TAH      There were no vitals filed for this visit.                       [facility] Adult PT Treatment/Exercise - 11/19/15 0001      Ambulation/Gait   Gait Comments gait around the building working on speed, had some difficulty going up hill but was mostly short of breath     Knee/Hip Exercises: Stretches   Passive Hamstring Stretch 5 reps;20 seconds   Piriformis Stretch 5 reps;20 seconds   Gastroc Stretch 5 reps;20 seconds   Other Knee/Hip Stretches ITB stretch 5 reps 20 seconds     Knee/Hip Exercises: Aerobic   Stationary Bike x 3 minutes   Nustep Level 4 x 5 minutes     Knee/Hip Exercises: Machines for Strengthening   Cybex Knee Extension 5# 2x10   Cybex Knee Flexion 25# 2x10   Other Machine went over how to go to the gym and the exercises , weights, form, machine adjustment and safety     Moist Heat  Therapy   Number Minutes Moist Heat 15 Minutes   Moist Heat Location Knee     Electrical Stimulation   Electrical Stimulation Location knees   Electrical Stimulation Action premod   Electrical Stimulation Parameters supine   Electrical Stimulation Goals Pain                  PT Short Term Goals - 11/19/15 1435      PT SHORT TERM GOAL #1   Title independent with initial HEP   Status Achieved           PT Long Term Goals - 11/19/15 1435      PT LONG TERM GOAL #1   Title understand safety in the gym and how to advance   Status Partially Met     PT LONG TERM GOAL #2   Title report ability to go up and dwon stairs step over step without pain > 3/10   Status Partially Met               Plan - 11/19/15 1434    Clinical Impression Statement Patient continues to be very active caring for a friend  and visiting a granddaughter in the hospital, reports that she is doing well with the exercises, went over gym safety today   PT Next Visit Plan Will issue an advanced HEP next week and possible D/C   Consulted and Agree with Plan of Care Patient      Patient will benefit from skilled therapeutic intervention in order to improve the following deficits and impairments:  Abnormal gait, Cardiopulmonary status limiting activity, Decreased activity tolerance, Decreased balance, Decreased mobility, Decreased range of motion, Decreased strength, Increased edema, Difficulty walking, Impaired flexibility, Pain  Visit Diagnosis: Acute pain of right knee  Difficulty in walking, not elsewhere classified  Stiffness of right knee, not elsewhere classified     Problem List Patient Active Problem List   Diagnosis Date Noted  . Depression, major, in remission ([place]) 11/27/2014  . Encounter for Medicare annual wellness exam 08/22/2014  . Obesity 04/10/2014  . COPD (chronic obstructive pulmonary disease) with chronic bronchitis ([place]) 04/10/2014  . Insomnia 04/10/2014  .  Vitamin D deficiency 01/07/2014  . Abnormal blood sugar 01/03/2014  . Medication management 01/03/2014  . Anxiety   . Morton's neuroma of left foot   . Osteoarthritis   . Polycythemia vera ([place]) 06/05/2009  . Mixed hyperlipidemia 06/05/2009  . Essential hypertension 06/05/2009    Sumner Boast., PT 11/19/2015, 2:36 PM  [address] [place], [place], [address] Phone: [phone]   Fax:  [phone]  Name: Kelsey Lopez MRN: [id number] Date of Birth: [birth year]

## 2015-11-27 ENCOUNTER — Encounter: Payer: Self-pay | Admitting: Physical Therapy

## 2015-11-27 ENCOUNTER — Ambulatory Visit: Payer: Medicare Other | Admitting: Physical Therapy

## 2015-11-27 DIAGNOSIS — M25661 Stiffness of right knee, not elsewhere classified: Secondary | ICD-10-CM

## 2015-11-27 DIAGNOSIS — R262 Difficulty in walking, not elsewhere classified: Secondary | ICD-10-CM | POA: Diagnosis not present

## 2015-11-27 DIAGNOSIS — M25561 Pain in right knee: Secondary | ICD-10-CM

## 2015-11-27 NOTE — Therapy (Signed)
[address] [place], [place], [address] Phone: [phone]   Fax:  [phone]  Physical Therapy Treatment  Patient Details  Name: Kelsey Lopez MRN: [id number] Date of Birth: Nov 26, 1940 Referring Provider: Eduard Roux  Encounter Date: 11/27/2015      PT End of Session - 11/27/15 1602    Visit Number 4   Date for PT Re-Evaluation 01/05/16   PT Start Time 1528   PT Stop Time 1612   PT Time Calculation (min) 44 min   Activity Tolerance Patient tolerated treatment well   Behavior During Therapy [facility] for tasks assessed/performed      Past Medical History:  Diagnosis Date  . Anxiety   . Dyslipidemia    on Rx x20 years  . HTN (hypertension)   . Hyperlipidemia   . Insomnia   . Morton's neuroma of left foot   . Multiple allergies   . Osteoarthritis   . Polycythemia   . PVC (premature ventricular contraction)     Past Surgical History:  Procedure Laterality Date  . ABDOMINAL HYSTERECTOMY    . KNEE ARTHROSCOPY     R  . TONSILLECTOMY    . VESICOVAGINAL FISTULA CLOSURE W/ TAH      There were no vitals filed for this visit.      Subjective Assessment - 11/27/15 1534    Subjective Reports feeling good overall, no increase of pain   Currently in Pain? Yes   Pain Score 1    Pain Location Knee   Pain Orientation Right;Posterior   Pain Descriptors / Indicators Sore                         [facility] Adult PT Treatment/Exercise - 11/27/15 0001      Ambulation/Gait   Gait Comments gait around the building working on speed, had some difficulty going up hill but was mostly short of breath     Knee/Hip Exercises: Stretches   Passive Hamstring Stretch 5 reps;20 seconds   Piriformis Stretch 5 reps;20 seconds   Gastroc Stretch 5 reps;20 seconds     Knee/Hip Exercises: Aerobic   Stationary Bike x 3 minutes   Nustep Level 4 x 5 minutes     Knee/Hip Exercises: Machines for  Strengthening   Cybex Knee Extension 5# 2x10   Cybex Knee Flexion 25# 2x10   Other Machine went over how to go to the gym and the exercises , weights, form, machine adjustment and safety                PT Education - 11/27/15 1602    Education provided Yes   Education Details went over TKE, SAQ, needed some cues for VMO contraction   Person(s) Educated Patient   Methods Explanation;Demonstration;Handout   Comprehension Verbalized understanding;Returned demonstration;Verbal cues required;Tactile cues required          PT Short Term Goals - 11/19/15 1435      PT SHORT TERM GOAL #1   Title independent with initial HEP   Status Achieved           PT Long Term Goals - 11/27/15 1604      PT LONG TERM GOAL #1   Title understand safety in the gym and how to advance   Status Achieved     PT LONG TERM GOAL #2   Title report ability to go up and dwon stairs step over step without pain >  3/10   Status Achieved     PT LONG TERM GOAL #3   Title increase AROM of the right knee to 0-115 degrees flexion   Status Achieved               Plan - 2015/12/24 1603    Clinical Impression Statement Patient overall doing very well, some difficulty with TKE and getting the VMO to fire, she feels good and safe with trying to return to gym and a walking program   PT Next Visit Plan D/C   Consulted and Agree with Plan of Care Patient      Patient will benefit from skilled therapeutic intervention in order to improve the following deficits and impairments:  Abnormal gait, Cardiopulmonary status limiting activity, Decreased activity tolerance, Decreased balance, Decreased mobility, Decreased range of motion, Decreased strength, Increased edema, Difficulty walking, Impaired flexibility, Pain  Visit Diagnosis: Acute pain of right knee  Difficulty in walking, not elsewhere classified  Stiffness of right knee, not elsewhere classified       G-Codes - December 24, 2015 1604     Functional Assessment Tool Used foto 43% disability   Functional Limitation Mobility: Walking and moving around   Mobility: Walking and Moving Around Current Status [phone]) At least 40 percent but less than 60 percent impaired, limited or restricted   Mobility: Walking and Moving Around Goal Status [phone]) At least 40 percent but less than 60 percent impaired, limited or restricted   Mobility: Walking and Moving Around Discharge Status [phone]) At least 40 percent but less than 60 percent impaired, limited or restricted      Problem List Patient Active Problem List   Diagnosis Date Noted  . Depression, major, in remission ([place]) 12-24-2014  . Encounter for Medicare annual wellness exam 08/22/2014  . Obesity 04/10/2014  . COPD (chronic obstructive pulmonary disease) with chronic bronchitis ([place]) 04/10/2014  . Insomnia 04/10/2014  . Vitamin D deficiency 01/07/2014  . Abnormal blood sugar 01/03/2014  . Medication management 01/03/2014  . Anxiety   . Morton's neuroma of left foot   . Osteoarthritis   . Polycythemia vera ([place]) 06/05/2009  . Mixed hyperlipidemia 06/05/2009  . Essential hypertension 06/05/2009    Sumner Boast., PT 24-Dec-2015, 4:08 PM  [address] [address] [place], [place], [address] Phone: [phone]   Fax:  [phone]  Name: AITANA LINSENBIGLER MRN: [id number] Date of Birth: 02/12/1940

## 2015-11-28 ENCOUNTER — Ambulatory Visit (INDEPENDENT_AMBULATORY_CARE_PROVIDER_SITE_OTHER): Payer: Medicare Other | Admitting: Internal Medicine

## 2015-11-28 ENCOUNTER — Encounter: Payer: Self-pay | Admitting: Internal Medicine

## 2015-11-28 VITALS — BP 138/66 | HR 60 | Temp 98.0°F | Resp 16 | Ht 66.0 in | Wt 182.0 lb

## 2015-11-28 DIAGNOSIS — M25562 Pain in left knee: Secondary | ICD-10-CM | POA: Diagnosis not present

## 2015-11-28 DIAGNOSIS — M25561 Pain in right knee: Secondary | ICD-10-CM | POA: Diagnosis not present

## 2015-11-28 DIAGNOSIS — I10 Essential (primary) hypertension: Secondary | ICD-10-CM

## 2015-11-28 DIAGNOSIS — Z23 Encounter for immunization: Secondary | ICD-10-CM | POA: Diagnosis not present

## 2015-11-28 DIAGNOSIS — G8929 Other chronic pain: Secondary | ICD-10-CM | POA: Diagnosis not present

## 2015-11-28 DIAGNOSIS — R7309 Other abnormal glucose: Secondary | ICD-10-CM

## 2015-11-28 DIAGNOSIS — B372 Candidiasis of skin and nail: Secondary | ICD-10-CM

## 2015-11-28 DIAGNOSIS — E782 Mixed hyperlipidemia: Secondary | ICD-10-CM | POA: Diagnosis not present

## 2015-11-28 DIAGNOSIS — Z79899 Other long term (current) drug therapy: Secondary | ICD-10-CM

## 2015-11-28 DIAGNOSIS — E559 Vitamin D deficiency, unspecified: Secondary | ICD-10-CM

## 2015-11-28 MED ORDER — CLOTRIMAZOLE-BETAMETHASONE 1-0.05 % EX CREA: TOPICAL_CREAM | CUTANEOUS | 1 refills | Status: DC

## 2015-11-28 NOTE — Progress Notes (Signed)
Assessment and Plan:  Hypertension:  -Continue medication,  -monitor blood pressure at home.  -Continue DASH diet.   -Reminder to go to the ER if any CP, SOB, nausea, dizziness, severe HA, changes vision/speech, left arm numbness and tingling, and jaw pain.  Cholesterol: -Continue diet and exercise.   Pre-diabetes: -Continue diet and exercise.   Vitamin D Def: -continue medications.   Bilateral knee pain -cont therapy -is seeing Dr. Erlinda Hong -recommended water aerobics in addition to therapy -can use tylenol as needed for discomfort  Vulvar rash -likely yeast dermatitis due to incontinence -try lotrisone cream -if no improvement it should be biopsied.    Continue diet and meds as discussed. Further disposition pending results of labs.  HPI 75 y.o. female  presents for 3 month follow up with hypertension, hyperlipidemia, prediabetes and vitamin D.   Her blood pressure has been controlled at home, today their BP is BP: 138/66.   She does not workout. She denies chest pain, shortness of breath, dizziness.   She is on cholesterol medication and denies myalgias. Her cholesterol is at goal. The cholesterol last visit was:   Lab Results  Component Value Date   CHOL 177 08/22/2015   HDL 80 08/22/2015   LDLCALC 80 08/22/2015   TRIG 85 08/22/2015   CHOLHDL 2.2 08/22/2015     She has been working on diet and exercise for prediabetes, and denies foot ulcerations, hyperglycemia, hypoglycemia , increased appetite, nausea, paresthesia of the feet, polydipsia, polyuria, visual disturbances, vomiting and weight loss. Last A1C in the office was:  Lab Results  Component Value Date   HGBA1C 5.4 08/22/2015    Patient is on Vitamin D supplement.  Lab Results  Component Value Date   VD25OH 44 08/22/2015     Patient reports that she has been having some trouble with her right knee.  She has done some physical therapy and has also seen Dr. Erlinda Hong.  She reports that they also gave her a shot.     She reports that she is using clobetasol currently on a rash on her vulva.  Dr. Jeffie Pollock her urologist is aware of this.  They believe that it is likely a yeast.  She has not tried nystatin or lotrisone for it.     Current Medications:  Current Outpatient Prescriptions on File Prior to Visit  Medication Sig Dispense Refill  . ALPRAZolam (XANAX) 0.5 MG tablet TAKE 1/2 TO 1 TABLET BY MOUTH THREE TIMES A DAY AS NEEDED 270 tablet 1  . aspirin 81 MG EC tablet Take 81 mg by mouth daily.      . cetirizine (ZYRTEC) 10 MG tablet Take 10 mg by mouth as needed.      . Cholecalciferol (VITAMIN D3) 2000 UNITS capsule Take 2,000 Units by mouth daily.      . citalopram (CELEXA) 40 MG tablet TAKE 1 TABLET BY MOUTH EVERY DAY 90 tablet 1  . clobetasol cream (TEMOVATE) [id number] % Apply 1 application topically 2 (two) times daily.    Marland Kitchen conjugated estrogens (PREMARIN) vaginal cream Place 1 Applicatorful vaginally. Use 3 days a week.    . Cranberry ([employer] CRANBERRY) 475 MG CAPS Take 1 capsule by mouth daily.      . fish oil-omega-3 fatty acids 1000 MG capsule Take 2 g by mouth daily.      . Flaxseed, Linseed, (FLAX SEED OIL) 1000 MG CAPS Take 1,000 mg by mouth daily.    . hydrochlorothiazide (MICROZIDE) 12.5 MG capsule TAKE ONE CAPSULE BY  MOUTH ONE TO TWO TIMES DAILY 90 capsule 1  . Magnesium 400 MG CAPS Take 400 mg by mouth daily.    . montelukast (SINGULAIR) 10 MG tablet TAKE ONE TABLET BY MOUTH ONE TIME DAILY 90 tablet 1  . Multiple Vitamin (MULTIVITAMIN) tablet Take 1 tablet by mouth daily.      . nitrofurantoin, macrocrystal-monohydrate, (MACROBID) 100 MG capsule Take 100 mg by mouth 2 (two) times daily. Per Dr Jeffie Pollock    . NON FORMULARY Calcium 1800 mg 1 tablet daily     . OVER THE COUNTER MEDICATION Otc Mucinex 1 tab daily    . simvastatin (ZOCOR) 40 MG tablet Take 1 tablet (40 mg total) by mouth at bedtime. 90 tablet 1  . traZODone (DESYREL) 150 MG tablet TAKE ONE-HALF TO ONE TABLET BY MOUTH ONE HOUR PRIOR TO  BEDTIME 90 tablet 1  . valsartan (DIOVAN) 320 MG tablet TAKE 1 TABLET (320 MG TOTAL) BY MOUTH DAILY. 90 tablet 1   No current facility-administered medications on file prior to visit.     Medical History:  Past Medical History:  Diagnosis Date  . Anxiety   . Dyslipidemia    on Rx x20 years  . HTN (hypertension)   . Hyperlipidemia   . Insomnia   . Morton's neuroma of left foot   . Multiple allergies   . Osteoarthritis   . Polycythemia   . PVC (premature ventricular contraction)     Allergies:  Allergies  Allergen Reactions  . Ace Inhibitors     Cough  . Ampicillin     rash  . Mevacor [Lovastatin]     Elevated LFT's  . Penicillins     REACTION: hives and  itching  . Sulfa Antibiotics     rash     Review of Systems:  Review of Systems  Constitutional: Negative for chills, fever and malaise/fatigue.  HENT: Negative for congestion, ear pain and sore throat.   Eyes: Negative.   Respiratory: Negative for cough, shortness of breath and wheezing.   Cardiovascular: Negative for chest pain, palpitations and leg swelling.  Gastrointestinal: Negative for abdominal pain, blood in stool, constipation, diarrhea, heartburn and melena.  Genitourinary: Negative.   Musculoskeletal: Positive for joint pain.  Skin: Positive for rash.  Neurological: Negative for dizziness, sensory change, loss of consciousness and headaches.  Psychiatric/Behavioral: Negative for depression. The patient is not nervous/anxious and does not have insomnia.     Family history- Review and unchanged  Social history- Review and unchanged  Physical Exam: BP 138/66   Pulse 60   Temp 98 F (36.7 C) (Temporal)   Resp 16   Ht 5\' 6"  (1.676 m)   Wt 182 lb (82.6 kg)   BMI 29.38 kg/m  Wt Readings from Last 3 Encounters:  11/28/15 182 lb (82.6 kg)  08/22/15 180 lb 12.8 oz (82 kg)  05/28/15 182 lb 3.2 oz (82.6 kg)    General Appearance: Well nourished well developed, in no apparent distress. Eyes:  PERRLA, EOMs, conjunctiva no swelling or erythema ENT/Mouth: Ear canals normal without obstruction, swelling, erythma, discharge.  TMs normal bilaterally.  Oropharynx moist, clear, without exudate, or postoropharyngeal swelling. Neck: Supple, thyroid normal,no cervical adenopathy  Respiratory: Respiratory effort normal, Breath sounds clear A&P without rhonchi, wheeze, or rale.  No retractions, no accessory usage. Cardio: RRR with no MRGs. Brisk peripheral pulses without edema.  Abdomen: Soft, + BS,  Non tender, no guarding, rebound, hernias, masses. Musculoskeletal: Full ROM, 5/5 strength, Normal gait Skin: Warm,  dry without rashes, lesions, ecchymosis.  Neuro: Awake and oriented X 3, Cranial nerves intact. Normal muscle tone, no cerebellar symptoms. Psych: Normal affect, Insight and Judgment appropriate.    Starlyn Skeans, PA-C 10:57 AM [facility]

## 2015-12-03 ENCOUNTER — Other Ambulatory Visit: Payer: Self-pay | Admitting: Internal Medicine

## 2015-12-04 DIAGNOSIS — M1711 Unilateral primary osteoarthritis, right knee: Secondary | ICD-10-CM | POA: Diagnosis not present

## 2015-12-12 DIAGNOSIS — R3121 Asymptomatic microscopic hematuria: Secondary | ICD-10-CM | POA: Diagnosis not present

## 2015-12-20 ENCOUNTER — Other Ambulatory Visit: Payer: Self-pay | Admitting: Internal Medicine

## 2016-01-05 DIAGNOSIS — H25813 Combined forms of age-related cataract, bilateral: Secondary | ICD-10-CM | POA: Diagnosis not present

## 2016-01-05 DIAGNOSIS — H53452 Other localized visual field defect, left eye: Secondary | ICD-10-CM | POA: Diagnosis not present

## 2016-01-17 ENCOUNTER — Other Ambulatory Visit: Payer: Self-pay | Admitting: Internal Medicine

## 2016-01-17 DIAGNOSIS — F419 Anxiety disorder, unspecified: Secondary | ICD-10-CM

## 2016-01-17 NOTE — Telephone Encounter (Signed)
Please call Alprazolam 

## 2016-01-20 ENCOUNTER — Other Ambulatory Visit: Payer: Self-pay | Admitting: Internal Medicine

## 2016-02-16 ENCOUNTER — Encounter (INDEPENDENT_AMBULATORY_CARE_PROVIDER_SITE_OTHER): Payer: Self-pay

## 2016-02-16 ENCOUNTER — Ambulatory Visit (INDEPENDENT_AMBULATORY_CARE_PROVIDER_SITE_OTHER): Payer: Medicare Other | Admitting: Orthopaedic Surgery

## 2016-02-16 ENCOUNTER — Encounter (INDEPENDENT_AMBULATORY_CARE_PROVIDER_SITE_OTHER): Payer: Self-pay | Admitting: Orthopaedic Surgery

## 2016-02-16 DIAGNOSIS — M1712 Unilateral primary osteoarthritis, left knee: Secondary | ICD-10-CM | POA: Diagnosis not present

## 2016-02-16 MED ORDER — LIDOCAINE HCL 1 % IJ SOLN
2.0000 mL | INTRAMUSCULAR | Status: AC | PRN
  Administered 2016-02-16: 2 mL

## 2016-02-16 MED ORDER — BUPIVACAINE HCL 0.5 % IJ SOLN
2.0000 mL | INTRAMUSCULAR | Status: AC | PRN
  Administered 2016-02-16: 2 mL via INTRA_ARTICULAR

## 2016-02-16 MED ORDER — METHYLPREDNISOLONE ACETATE 40 MG/ML IJ SUSP
40.0000 mg | INTRAMUSCULAR | Status: AC | PRN
  Administered 2016-02-16: 40 mg via INTRA_ARTICULAR

## 2016-02-16 NOTE — Progress Notes (Signed)
Office Visit Note   Patient: Kelsey Lopez           Date of Birth: 1941-01-13           MRN: [id number] Visit Date: 02/16/2016              Requested by: Unk Pinto, MD [address] [place], [place] [address] PCP: Alesia Richards, MD   Assessment & Plan: Visit Diagnoses: No diagnosis found.  Plan: Left knee intra-articular injection was given today under sterile conditions patient tolerates well. She is going to give [place] a call if she wants another injection of her right knee before her trip. At this point if the cortisone injections working I think she does not need any viscosupplementation but this is an option.  Follow-Up Instructions: Return if symptoms worsen or fail to improve.   Orders:  No orders of the defined types were placed in this encounter.  No orders of the defined types were placed in this encounter.     Procedures: Large Joint Inj Date/Time: 02/16/2016 6:34 PM Performed by: Leandrew Koyanagi Authorized by: Leandrew Koyanagi   Consent Given by:  Patient Timeout: prior to procedure the correct patient, procedure, and site was verified   Indications:  Pain Location:  Knee Site:  R knee Prep: patient was prepped and draped in usual sterile fashion   Needle Size:  22 G Ultrasound Guidance: No   Fluoroscopic Guidance: No   Arthrogram: No   Medications:  2 mL lidocaine 1 %; 2 mL bupivacaine 0.5 %; 40 mg methylPREDNISolone acetate 40 MG/ML Patient tolerance:  Patient tolerated the procedure well with no immediate complications     Clinical Data: No additional findings.   Subjective: Chief Complaint  Patient presents with  . Right Knee - Pain  . Left Knee - Pain    [name] comes back today for left knee pain. Her right knee is doing at her from the injection. She is going on a trip in April for a month. Her left knee has been bothering her and is worse with the cold weather.    Review of Systems   Objective: Vital Signs:  There were no vitals taken for this visit.  Physical Exam  Ortho Exam Exam of the right knee is stable. Exam of the left knee shows no joint effusion good range of motion. Specialty Comments:  No specialty comments available.  Imaging: No results found.   PMFS History: Patient Active Problem List   Diagnosis Date Noted  . Depression, major, in remission ([place]) 11/27/2014  . Encounter for Medicare annual wellness exam 08/22/2014  . Obesity 04/10/2014  . COPD (chronic obstructive pulmonary disease) with chronic bronchitis ([place]) 04/10/2014  . Insomnia 04/10/2014  . Vitamin D deficiency 01/07/2014  . Abnormal blood sugar 01/03/2014  . Medication management 01/03/2014  . Anxiety   . Morton's neuroma of left foot   . Osteoarthritis   . Polycythemia vera ([place]) 06/05/2009  . Mixed hyperlipidemia 06/05/2009  . Essential hypertension 06/05/2009   Past Medical History:  Diagnosis Date  . Anxiety   . Dyslipidemia    on Rx x20 years  . HTN (hypertension)   . Hyperlipidemia   . Insomnia   . Morton's neuroma of left foot   . Multiple allergies   . Osteoarthritis   . Polycythemia   . PVC (premature ventricular contraction)     Family History  Problem Relation Age of Onset  . Osteoarthritis Mother   .  Heart attack Mother   . Heart disease Mother   . Cancer Father     lung  . Osteoarthritis Father   . Hyperlipidemia Brother   . Diabetes Brother     Past Surgical History:  Procedure Laterality Date  . ABDOMINAL HYSTERECTOMY    . KNEE ARTHROSCOPY     R  . TONSILLECTOMY    . VESICOVAGINAL FISTULA CLOSURE W/ TAH     Social History   Occupational History  . Not on file.   Social History Main Topics  . Smoking status: Never Smoker  . Smokeless tobacco: Not on file     Comment: no tobacco   . Alcohol use Yes     Comment: rare  . Drug use: Unknown  . Sexual activity: Not on file

## 2016-03-05 ENCOUNTER — Ambulatory Visit: Payer: Self-pay | Admitting: Internal Medicine

## 2016-03-16 ENCOUNTER — Other Ambulatory Visit: Payer: Self-pay | Admitting: Internal Medicine

## 2016-03-18 DIAGNOSIS — R3121 Asymptomatic microscopic hematuria: Secondary | ICD-10-CM | POA: Diagnosis not present

## 2016-03-24 ENCOUNTER — Ambulatory Visit (INDEPENDENT_AMBULATORY_CARE_PROVIDER_SITE_OTHER): Payer: Medicare Other | Admitting: Internal Medicine

## 2016-03-24 ENCOUNTER — Encounter: Payer: Self-pay | Admitting: Internal Medicine

## 2016-03-24 VITALS — BP 136/62 | HR 76 | Temp 98.6°F | Resp 16 | Ht 66.0 in | Wt 180.0 lb

## 2016-03-24 DIAGNOSIS — J069 Acute upper respiratory infection, unspecified: Secondary | ICD-10-CM | POA: Diagnosis not present

## 2016-03-24 MED ORDER — BENZONATATE 200 MG PO CAPS: 200.0000 mg | ORAL_CAPSULE | Freq: Three times a day (TID) | ORAL | 0 refills | Status: DC | PRN

## 2016-03-24 MED ORDER — AZITHROMYCIN 250 MG PO TABS: ORAL_TABLET | ORAL | 0 refills | Status: DC

## 2016-03-24 MED ORDER — PREDNISONE 20 MG PO TABS: ORAL_TABLET | ORAL | 0 refills | Status: DC

## 2016-03-24 MED ORDER — PROMETHAZINE-CODEINE 6.25-10 MG/5ML PO SYRP: 5.0000 mL | ORAL_SOLUTION | Freq: Four times a day (QID) | ORAL | 0 refills | Status: DC | PRN

## 2016-03-24 NOTE — Progress Notes (Signed)
HPI  Patient presents to the office for evaluation of cough.  It has been going on for 4 days.  Patient reports night > day, dry, worse with lying down.  They also endorse change in voice, chills, postnasal drip and nasal congestion, clear rhinorrhea, ear congestion, and sore throat.  .  They have tried cough drops, allergy pill, singulair, and flonase.  They report that nothing has worked.  They denies other sick contacts.  Review of Systems  Constitutional: Positive for malaise/fatigue. Negative for chills and fever.  HENT: Positive for congestion, ear pain, hearing loss and sore throat.   Respiratory: Positive for cough. Negative for sputum production, shortness of breath and wheezing.   Cardiovascular: Negative for chest pain, palpitations and leg swelling.  Neurological: Positive for headaches.    PE:  Vitals:   03/24/16 1531  BP: 136/62  Pulse: 76  Resp: 16  Temp: 98.6 F (37 C)    General:  Alert and non-toxic, WDWN, NAD HEENT: NCAT, PERLA, EOM normal, no occular discharge or erythema.  Nasal mucosal edema with sinus tenderness to palpation.  Oropharynx clear with minimal oropharyngeal edema and erythema.  Mucous membranes moist and pink. Neck:  Cervical adenopathy Chest:  RRR no MRGs.  Lungs clear to auscultation A&P with no wheezes rhonchi or rales.   Abdomen: +BS x 4 quadrants, soft, non-tender, no guarding, rigidity, or rebound. Skin: warm and dry no rash Neuro: A&Ox4, CN II-XII grossly intact  Assessment and Plan:   1. Acute URI -cont singulair -cont claritin -tessalon for day -phenergan codeine for nighttime - azithromycin (ZITHROMAX Z-PAK) 250 MG tablet; 2 po day one, then 1 daily x 4 days  Dispense: 6 tablet; Refill: 0 - predniSONE (DELTASONE) 20 MG tablet; 3 tabs po daily x 3 days, then 2 tabs x 3 days, then 1.5 tabs x 3 days, then 1 tab x 3 days, then 0.5 tabs x 3 days  Dispense: 27 tablet; Refill: 0 - promethazine-codeine (PHENERGAN WITH CODEINE) 6.25-10  MG/5ML syrup; Take 5 mLs by mouth every 6 (six) hours as needed for cough.  Dispense: 120 mL; Refill: 0 - benzonatate (TESSALON) 200 MG capsule; Take 1 capsule (200 mg total) by mouth 3 (three) times daily as needed for cough.  Dispense: 20 capsule; Refill: 0 -return to office if worsening shortness of breath, wheezing, fevers, or hemoptysis

## 2016-03-28 ENCOUNTER — Other Ambulatory Visit: Payer: Self-pay | Admitting: Internal Medicine

## 2016-03-29 ENCOUNTER — Other Ambulatory Visit: Payer: Self-pay | Admitting: Internal Medicine

## 2016-04-05 ENCOUNTER — Encounter: Payer: Self-pay | Admitting: Internal Medicine

## 2016-04-05 ENCOUNTER — Ambulatory Visit (INDEPENDENT_AMBULATORY_CARE_PROVIDER_SITE_OTHER): Payer: Medicare Other | Admitting: Internal Medicine

## 2016-04-05 VITALS — BP 140/74 | HR 90 | Temp 97.7°F | Resp 16 | Ht 66.0 in | Wt 179.0 lb

## 2016-04-05 DIAGNOSIS — E559 Vitamin D deficiency, unspecified: Secondary | ICD-10-CM | POA: Diagnosis not present

## 2016-04-05 DIAGNOSIS — Z79899 Other long term (current) drug therapy: Secondary | ICD-10-CM

## 2016-04-05 DIAGNOSIS — E782 Mixed hyperlipidemia: Secondary | ICD-10-CM

## 2016-04-05 DIAGNOSIS — I10 Essential (primary) hypertension: Secondary | ICD-10-CM

## 2016-04-05 DIAGNOSIS — R7303 Prediabetes: Secondary | ICD-10-CM

## 2016-04-05 LAB — HEMOGLOBIN A1C
Hgb A1c MFr Bld: 5.3 % (ref <5.7)
Mean Plasma Glucose: 105 mg/dL

## 2016-04-05 LAB — CBC WITH DIFFERENTIAL/PLATELET
Basophils Absolute: 0 cells/uL (ref 0–200)
Basophils Relative: 0 %
Eosinophils Absolute: 129 cells/uL (ref 15–500)
Eosinophils Relative: 1 %
HCT: 45.6 % — ABNORMAL HIGH (ref 35.0–45.0)
Hemoglobin: 15.5 g/dL (ref 11.7–15.5)
Lymphocytes Relative: 15 %
Lymphs Abs: 1935 cells/uL (ref 850–3900)
MCH: 31.8 pg (ref 27.0–33.0)
MCHC: 34 g/dL (ref 32.0–36.0)
MCV: 93.6 fL (ref 80.0–100.0)
MPV: 9.3 fL (ref 7.5–12.5)
Monocytes Absolute: 903 cells/uL (ref 200–950)
Monocytes Relative: 7 %
Neutro Abs: 9933 cells/uL — ABNORMAL HIGH (ref 1500–7800)
Neutrophils Relative %: 77 %
Platelets: 243 10*3/uL (ref 140–400)
RBC: 4.87 MIL/uL (ref 3.80–5.10)
RDW: 13.2 % (ref 11.0–15.0)
WBC: 12.9 10*3/uL — ABNORMAL HIGH (ref 3.8–10.8)

## 2016-04-05 LAB — BASIC METABOLIC PANEL WITH GFR
BUN: 12 mg/dL (ref 7–25)
CO2: 31 mmol/L (ref 20–31)
Calcium: 10.1 mg/dL (ref 8.6–10.4)
Chloride: 96 mmol/L — ABNORMAL LOW (ref 98–110)
Creat: 0.68 mg/dL (ref 0.60–0.93)
GFR, Est African American: >89 mL/min (ref >=60)
GFR, Est Non African American: 86 mL/min (ref >=60)
Glucose, Bld: 86 mg/dL (ref 65–99)
Potassium: 3.3 mmol/L — ABNORMAL LOW (ref 3.5–5.3)
Sodium: 138 mmol/L (ref 135–146)

## 2016-04-05 LAB — TSH: TSH: 2.85 mIU/L

## 2016-04-05 LAB — LIPID PANEL
Cholesterol: 197 mg/dL (ref <200)
HDL: 89 mg/dL (ref >50)
LDL Cholesterol: 80 mg/dL (ref <100)
Total CHOL/HDL Ratio: 2.2 Ratio (ref <5.0)
Triglycerides: 142 mg/dL (ref <150)
VLDL: 28 mg/dL (ref <30)

## 2016-04-05 LAB — HEPATIC FUNCTION PANEL
ALT: 28 U/L (ref 6–29)
AST: 22 U/L (ref 10–35)
Albumin: 3.8 g/dL (ref 3.6–5.1)
Alkaline Phosphatase: 62 U/L (ref 33–130)
Bilirubin, Direct: 0.2 mg/dL (ref <=0.2)
Indirect Bilirubin: 0.6 mg/dL (ref 0.2–1.2)
Total Bilirubin: 0.8 mg/dL (ref 0.2–1.2)
Total Protein: 5.9 g/dL — ABNORMAL LOW (ref 6.1–8.1)

## 2016-04-05 MED ORDER — HYDROCHLOROTHIAZIDE 25 MG PO TABS: 25.0000 mg | ORAL_TABLET | Freq: Every day | ORAL | 1 refills | Status: DC

## 2016-04-05 NOTE — Patient Instructions (Signed)

## 2016-04-05 NOTE — Progress Notes (Signed)
[facility] Unk Pinto, M.D.        Uvaldo Bristle. Silverio Lay, P.A.-C       Starlyn Skeans, P.A.-C  [facility]                [address] [place], [place] [id number] Telephone [phone] Telefax [phone] ______________________________________________________________________     This very nice 76 y.o. [facility] presents for 6 month follow up with Hypertension, Hyperlipidemia, Pre-Diabetes and Vitamin D Deficiency.      Patient is treated for HTN (1996) & BP has been controlled at home. Today's BP is at goal - 140/74. Patient has had no complaints of any cardiac type chest pain, palpitations, dyspnea/orthopnea/PND, dizziness, claudication, or dependent edema.     Hyperlipidemia is controlled with diet & meds. Patient denies myalgias or other med SE's. Last Lipids were at goal: Lab Results  Component Value Date   CHOL 177 08/22/2015   HDL 80 08/22/2015   LDLCALC 80 08/22/2015   TRIG 85 08/22/2015   CHOLHDL 2.2 08/22/2015      Also, the patient has history of PreDiabetes (A1c 5.8% in 2015)  and has had no symptoms of reactive hypoglycemia, diabetic polys, paresthesias or visual blurring.  Last A1c was at goal:   Lab Results  Component Value Date   HGBA1C 5.4 08/22/2015      Further, the patient also has history of Vitamin D Deficiency and supplements vitamin D without any suspected side-effects. Last vitamin D was at goal: Lab Results  Component Value Date   VD25OH 82 08/22/2015   Current Outpatient Prescriptions on File Prior to Visit  Medication Sig  . ALPRAZolam (XANAX) 0.5 MG tablet TAKE 1/2-1 TAB x /DAY AS NEEDED  . aspirin 81 MG EC tablet Take  daily.    . cetirizine (ZYRTEC) 10 MG tablet Take  as needed.    Marland Kitchen VITAMIN D 2000 UNITS capsule Take  daily.    . citalopram 40 MG tablet TAKE 1 TAB EVERY DAY  . clobetasol cream  AB-123456789 % Apply 1 application topically 2 (two) times daily.  Marland Kitchen LOTRISONE  cream APPLY TO AFFECTED AREA 2 TIMES DAILY  . PREMARIN vaginal cream Place 1 Applicatorful vaginally. Use 3 days a week.  . Cranberry  475 MG CAPS Take 1 cap daily.    . diclofenac  75 MG EC tablet Take 2 x / daily.  . fish oil-omega-3  1000 MG capsule Take 2 g  daily.    Marland Kitchen FLAX SEED OIL 1000 MG CAPS Take 1,000 mg by mouth daily.  . hydrochlorothiazide  12.5 MG cap TAKE ONE CAP ONE TO TWO TIMES DAILY  . Magnesium 400 MG CAPS Take  daily.  . montelukast  10 MG tablet TAKE ONE TAB  DAILY  . Multiple Vitamin  Take 1 tab daily.    . Calcium 1800 mg  1 tablet daily   . Mucinex 1 tab daily  . PROBIOTIC-10 CAPS Take 1 tablet by mouth daily.  . simvastatin  40 MG tablet TAKE 1 TABLET (40 MG TOTAL) BY MOUTH AT BEDTIME.  . traZODone  150 MG tablet TAKE 1/2-1  TAB ONE HOUR PRIOR TO BEDTIME  . valsartan  320 MG tablet TAKE 1 TAB DAILY.   Allergies  Allergen Reactions  . Ace Inhibitors     Cough  . Ampicillin     rash  .  Mevacor [Lovastatin]     Elevated LFT's  . Penicillins     REACTION: hives and  itching  . Sulfa Antibiotics     rash   PMHx:   Past Medical History:  Diagnosis Date  . Anxiety   . Dyslipidemia    on Rx x20 years  . HTN (hypertension)   . Hyperlipidemia   . Insomnia   . Morton's neuroma of left foot   . Multiple allergies   . Osteoarthritis   . Polycythemia   . PVC (premature ventricular contraction)    Immunization History  Administered Date(s) Administered  . Influenza Split 11/15/2011, 11/28/2012, 10/31/2013, 01/07/2014  . Influenza, High Dose Seasonal PF 11/27/2014, 11/28/2015  . Pneumococcal Conjugate-13 10/02/2013  . Tdap 08/01/2013  . Zoster 05/25/2011   Past Surgical History:  Procedure Laterality Date  . ABDOMINAL HYSTERECTOMY    . KNEE ARTHROSCOPY     R  . TONSILLECTOMY    . VESICOVAGINAL FISTULA CLOSURE W/ TAH     FHx:    Reviewed / unchanged  SHx:    Reviewed / unchanged  Systems Review:  Constitutional: Denies fever, chills, wt  changes, headaches, insomnia, fatigue, night sweats, change in appetite. Eyes: Denies redness, blurred vision, diplopia, discharge, itchy, watery eyes.  ENT: Denies discharge, congestion, post nasal drip, epistaxis, sore throat, earache, hearing loss, dental pain, tinnitus, vertigo, sinus pain, snoring.  CV: Denies chest pain, palpitations, irregular heartbeat, syncope, dyspnea, diaphoresis, orthopnea, PND, claudication or edema. Respiratory: denies cough, dyspnea, DOE, pleurisy, hoarseness, laryngitis, wheezing.  Gastrointestinal: Denies dysphagia, odynophagia, heartburn, reflux, water brash, abdominal pain or cramps, nausea, vomiting, bloating, diarrhea, constipation, hematemesis, melena, hematochezia  or hemorrhoids. Genitourinary: Denies dysuria, frequency, urgency, nocturia, hesitancy, discharge, hematuria or flank pain. Musculoskeletal: Denies arthralgias, myalgias, stiffness, jt. swelling, pain, limping or strain/sprain.  Skin: Denies pruritus, rash, hives, warts, acne, eczema or change in skin lesion(s). Neuro: No weakness, tremor, incoordination, spasms, paresthesia or pain. Psychiatric: Denies confusion, memory loss or sensory loss. Endo: Denies change in weight, skin or hair change.  Heme/Lymph: No excessive bleeding, bruising or enlarged lymph nodes.  Physical Exam  BP 140/74   Pulse 90   Temp 97.7 F (36.5 C)   Resp 16   Ht 5\' 6"  (1.676 m)   Wt 179 lb (81.2 kg)   BMI 28.89 kg/m   Appears well nourished and in no distress.  Eyes: PERRLA, EOMs, conjunctiva no swelling or erythema. Sinuses: No frontal/maxillary tenderness ENT/Mouth: EAC's clear, TM's nl w/o erythema, bulging. Nares clear w/o erythema, swelling, exudates. Oropharynx clear without erythema or exudates. Oral hygiene is good. Tongue normal, non obstructing. Hearing intact.  Neck: Supple. Thyroid nl. Car 2+/2+ without bruits, nodes or JVD. Chest: Respirations nl with BS clear & equal w/o rales, rhonchi, wheezing  or stridor.  Cor: Heart sounds normal w/ regular rate and rhythm without sig. murmurs, gallops, clicks, or rubs. Peripheral pulses normal and equal  without edema.  Abdomen: Soft & bowel sounds normal. Non-tender w/o guarding, rebound, hernias, masses, or organomegaly.  Lymphatics: Unremarkable.  Musculoskeletal: Full ROM all peripheral extremities, joint stability, 5/5 strength, and normal gait.  Skin: Warm, dry without exposed rashes, lesions or ecchymosis apparent.  Neuro: Cranial nerves intact, reflexes equal bilaterally. Sensory-motor testing grossly intact. Tendon reflexes grossly intact.  Pysch: Alert & oriented x 3.  Insight and judgement nl & appropriate. No ideations.  Assessment and Plan:  - Continue medication, monitor blood pressure at home.  - Continue DASH diet. Reminder to go  to the ER if any CP,  SOB, nausea, dizziness, severe HA, changes vision/speech,  left arm numbness and tingling and jaw pain.  - Continue diet/meds, exercise,& lifestyle modifications.  - Continue monitor periodic cholesterol/liver & renal functions  [facility] Unk Pinto, M.D.        Uvaldo Bristle. Silverio Lay, P.A.-C       Starlyn Skeans, P.A.-C  [facility]                [address] [place], [place] [phone] Telephone [phone] Telefax [phone] ______________________________________________________________________     This very nice 76 y.o.female presents for 3 month follow up with Hypertension, Hyperlipidemia, Pre-Diabetes and Vitamin D Deficiency.      Patient is treated for HTN & BP has been controlled at home. Today's BP: 140/74. Patient has had no complaints of any cardiac type chest pain, palpitations, dyspnea/orthopnea/PND, dizziness, claudication, or dependent edema.     Hyperlipidemia is controlled with diet & meds. Patient denies myalgias or other med SE's. Last Lipids were  Lab Results   Component Value Date   CHOL 177 08/22/2015   HDL 80 08/22/2015   LDLCALC 80 08/22/2015   TRIG 85 08/22/2015   CHOLHDL 2.2 08/22/2015      Also, the patient has history of T2_NIDDM PreDiabetes and has had no symptoms of reactive hypoglycemia, diabetic polys, paresthesias or visual blurring.  Last A1c was  Lab Results  Component Value Date   HGBA1C 5.4 08/22/2015      Further, the patient also has history of Vitamin D Deficiency and supplements vitamin D without any suspected side-effects. Last vitamin D was   Lab Results  Component Value Date   VD25OH 27 08/22/2015   Current Outpatient Prescriptions on File Prior to Visit  Medication Sig  . ALPRAZolam (XANAX) 0.5 MG tablet TAKE 1/2 TO 1 TABLET BY MOUTH THREE TIMES A DAY AS NEEDED  . aspirin 81 MG EC tablet Take 81 mg by mouth daily.    . cetirizine (ZYRTEC) 10 MG tablet Take 10 mg by mouth as needed.    . Cholecalciferol (VITAMIN D3) 2000 UNITS capsule Take 2,000 Units by mouth daily.    . citalopram (CELEXA) 40 MG tablet TAKE 1 TABLET BY MOUTH EVERY DAY  . clobetasol cream (TEMOVATE) AB-123456789 % Apply 1 application topically 2 (two) times daily.  . clotrimazole-betamethasone (LOTRISONE) cream APPLY TO AFFECTED AREA 2 TIMES DAILY  . conjugated estrogens (PREMARIN) vaginal cream Place 1 Applicatorful vaginally. Use 3 days a week.  . Cranberry ([employer] CRANBERRY) 475 MG CAPS Take 1 capsule by mouth daily.    . diclofenac (VOLTAREN) 75 MG EC tablet Take 75 mg by mouth 2 (two) times daily.  . fish oil-omega-3 fatty acids 1000 MG capsule Take 2 g by mouth daily.    . Flaxseed, Linseed, (FLAX SEED OIL) 1000 MG CAPS Take 1,000 mg by mouth daily.  . Magnesium 400 MG CAPS Take 400 mg by mouth daily.  . montelukast (SINGULAIR) 10 MG tablet TAKE ONE TABLET BY MOUTH ONE TIME DAILY  . Multiple Vitamin (MULTIVITAMIN) tablet Take 1 tablet by mouth daily.    . NON FORMULARY Calcium 1800 mg 1 tablet daily   . OVER THE COUNTER MEDICATION Otc Mucinex 1 tab  daily  . Probiotic Product (PROBIOTIC-10) CAPS Take 1 tablet by mouth daily.  . simvastatin (ZOCOR) 40 MG  tablet TAKE 1 TABLET (40 MG TOTAL) BY MOUTH AT BEDTIME.  . traZODone (DESYREL) 150 MG tablet TAKE ONE-HALF TO ONE TABLET BY MOUTH ONE HOUR PRIOR TO BEDTIME  . valsartan (DIOVAN) 320 MG tablet TAKE 1 TABLET (320 MG TOTAL) BY MOUTH DAILY.   No current facility-administered medications on file prior to visit.    Allergies  Allergen Reactions  . Ace Inhibitors     Cough  . Ampicillin     rash  . Mevacor [Lovastatin]     Elevated LFT's  . Penicillins     REACTION: hives and  itching  . Sulfa Antibiotics     rash   PMHx:   Past Medical History:  Diagnosis Date  . Anxiety   . Dyslipidemia    on Rx x20 years  . HTN (hypertension)   . Hyperlipidemia   . Insomnia   . Morton's neuroma of left foot   . Multiple allergies   . Osteoarthritis   . Polycythemia   . PVC (premature ventricular contraction)    Immunization History  Administered Date(s) Administered  . Influenza Split 11/15/2011, 11/28/2012, 10/31/2013, 01/07/2014  . Influenza, High Dose Seasonal PF 11/27/2014, 11/28/2015  . Pneumococcal Conjugate-13 10/02/2013  . Tdap 08/01/2013  . Zoster 05/25/2011   Past Surgical History:  Procedure Laterality Date  . ABDOMINAL HYSTERECTOMY    . KNEE ARTHROSCOPY     R  . TONSILLECTOMY    . VESICOVAGINAL FISTULA CLOSURE W/ TAH     FHx:    Reviewed / unchanged  SHx:    Reviewed / unchanged  Systems Review:  Constitutional: Denies fever, chills, wt changes, headaches, insomnia, fatigue, night sweats, change in appetite. Eyes: Denies redness, blurred vision, diplopia, discharge, itchy, watery eyes.  ENT: Denies discharge, congestion, post nasal drip, epistaxis, sore throat, earache, hearing loss, dental pain, tinnitus, vertigo, sinus pain, snoring.  CV: Denies chest pain, palpitations, irregular heartbeat, syncope, dyspnea, diaphoresis, orthopnea, PND, claudication or  edema. Respiratory: denies cough, dyspnea, DOE, pleurisy, hoarseness, laryngitis, wheezing.  Gastrointestinal: Denies dysphagia, odynophagia, heartburn, reflux, water brash, abdominal pain or cramps, nausea, vomiting, bloating, diarrhea, constipation, hematemesis, melena, hematochezia  or hemorrhoids. Genitourinary: Denies dysuria, frequency, urgency, nocturia, hesitancy, discharge, hematuria or flank pain. Musculoskeletal: Denies arthralgias, myalgias, stiffness, jt. swelling, pain, limping or strain/sprain.  Skin: Denies pruritus, rash, hives, warts, acne, eczema or change in skin lesion(s). Neuro: No weakness, tremor, incoordination, spasms, paresthesia or pain. Psychiatric: Denies confusion, memory loss or sensory loss. Endo: Denies change in weight, skin or hair change.  Heme/Lymph: No excessive bleeding, bruising or enlarged lymph nodes.  Physical Exam  BP 140/74   Pulse 90   Temp 97.7 F (36.5 C)   Resp 16   Ht 5\' 6"  (1.676 m)   Wt 179 lb (81.2 kg)   BMI 28.89 kg/m   Appears well nourished and in no distress.  Eyes: PERRLA, EOMs, conjunctiva no swelling or erythema. Sinuses: No frontal/maxillary tenderness ENT/Mouth: EAC's clear, TM's nl w/o erythema, bulging. Nares clear w/o erythema, swelling, exudates. Oropharynx clear without erythema or exudates. Oral hygiene is good. Tongue normal, non obstructing. Hearing intact.  Neck: Supple. Thyroid nl. Car 2+/2+ without bruits, nodes or JVD. Chest: Respirations nl with BS clear & equal w/o rales, rhonchi, wheezing or stridor.  Cor: Heart sounds normal w/ regular rate and rhythm without sig. murmurs, gallops, clicks, or rubs. Peripheral pulses normal and equal  without edema.  Abdomen: Soft & bowel sounds normal. Non-tender w/o guarding, rebound, hernias, masses, or  organomegaly.  Lymphatics: Unremarkable.  Musculoskeletal: Full ROM all peripheral extremities, joint stability, 5/5 strength, and normal gait.  Skin: Warm, dry without  exposed rashes, lesions or ecchymosis apparent.  Neuro: Cranial nerves intact, reflexes equal bilaterally. Sensory-motor testing grossly intact. Tendon reflexes grossly intact.  Pysch: Alert & oriented x 3.  Insight and judgement nl & appropriate. No ideations.  Assessment and Plan:  1. Essential hypertension  - Continue medication, monitor blood pressure at home.  - Continue DASH diet. Reminder to go to the ER if any CP,  SOB, nausea, dizziness, severe HA, changes vision/speech,  left arm numbness and tingling and jaw pain.  - CBC with Differential/Platelet - BASIC METABOLIC PANEL WITH GFR - TSH - hydrochlorothiazide 25 MG tablet; Take 1 tab daily.  Dispense: 90 ; Refill: 1  2. Mixed hyperlipidemia  - Continue diet/meds, exercise,& lifestyle modifications.  - Continue monitor periodic cholesterol/liver & renal functions  - Hepatic function panel - Lipid panel - TSH  3. Prediabetes       - Continue diet, exercise, lifestyle modifications.  - Monitor appropriate labs.  - Hemoglobin A1c - Insulin, random  4. Vitamin D deficiency  - Continue supplementation. - VITAMIN D 25 Hydroxy   5. Medication management   - CBC with Differential/Platelet - BASIC METABOLIC PANEL WITH GFR - Hepatic function panel - Magnesium - Lipid panel - TSH - Hemoglobin A1c - Insulin, random - VITAMIN D 25 Hydroxy        Recommended regular exercise, BP monitoring, weight control, and discussed med and SE's. Recommended labs to assess and monitor clinical status. Further disposition pending results of labs. Over 30 minutes of exam, counseling, chart review was performed

## 2016-04-06 LAB — INSULIN, RANDOM: Insulin: 9.2 u[IU]/mL (ref 2.0–19.6)

## 2016-04-06 LAB — VITAMIN D 25 HYDROXY (VIT D DEFICIENCY, FRACTURES): Vit D, 25-Hydroxy: 60 ng/mL (ref 30–100)

## 2016-04-06 LAB — MAGNESIUM: Magnesium: 1.9 mg/dL (ref 1.5–2.5)

## 2016-04-15 ENCOUNTER — Other Ambulatory Visit: Payer: Self-pay | Admitting: Physician Assistant

## 2016-04-26 ENCOUNTER — Ambulatory Visit (INDEPENDENT_AMBULATORY_CARE_PROVIDER_SITE_OTHER): Payer: Medicare Other | Admitting: Orthopaedic Surgery

## 2016-04-26 ENCOUNTER — Encounter (INDEPENDENT_AMBULATORY_CARE_PROVIDER_SITE_OTHER): Payer: Self-pay | Admitting: Orthopaedic Surgery

## 2016-04-26 DIAGNOSIS — M1712 Unilateral primary osteoarthritis, left knee: Secondary | ICD-10-CM | POA: Diagnosis not present

## 2016-04-26 DIAGNOSIS — M1711 Unilateral primary osteoarthritis, right knee: Secondary | ICD-10-CM

## 2016-04-26 MED ORDER — DICLOFENAC SODIUM 75 MG PO TBEC: 75.0000 mg | DELAYED_RELEASE_TABLET | Freq: Two times a day (BID) | ORAL | 3 refills | Status: DC

## 2016-04-26 MED ORDER — BUPIVACAINE HCL 0.5 % IJ SOLN
2.0000 mL | INTRAMUSCULAR | Status: AC | PRN
  Administered 2016-04-26: 2 mL via INTRA_ARTICULAR

## 2016-04-26 MED ORDER — METHYLPREDNISOLONE ACETATE 40 MG/ML IJ SUSP
40.0000 mg | INTRAMUSCULAR | Status: AC | PRN
  Administered 2016-04-26: 40 mg via INTRA_ARTICULAR

## 2016-04-26 MED ORDER — LIDOCAINE HCL 1 % IJ SOLN
2.0000 mL | INTRAMUSCULAR | Status: AC | PRN
  Administered 2016-04-26: 2 mL

## 2016-04-26 NOTE — Progress Notes (Signed)
Patient underwent bilateral knee cortisone injections today without any immediate complications. We'll see her back as needed.

## 2016-04-26 NOTE — Progress Notes (Signed)
   Procedure Note  Patient: Kelsey Lopez             Date of Birth: 1940-09-14           MRN: [id number]             Visit Date: 04/26/2016  Procedures: Visit Diagnoses: Unilateral primary osteoarthritis, left knee  Unilateral primary osteoarthritis, right knee  Large Joint Inj Date/Time: 04/26/2016 1:22 PM Performed by: Leandrew Koyanagi Authorized by: Leandrew Koyanagi   Consent Given by:  Patient Timeout: prior to procedure the correct patient, procedure, and site was verified   Indications:  Pain Location:  Knee Site:  R knee Prep: patient was prepped and draped in usual sterile fashion   Needle Size:  22 G Ultrasound Guidance: No   Fluoroscopic Guidance: No   Arthrogram: No   Patient tolerance:  Patient tolerated the procedure well with no immediate complications Large Joint Inj Date/Time: 04/26/2016 1:22 PM Performed by: Leandrew Koyanagi Authorized by: Leandrew Koyanagi   Consent Given by:  Patient Timeout: prior to procedure the correct patient, procedure, and site was verified   Indications:  Pain Location:  Knee Site:  R knee Prep: patient was prepped and draped in usual sterile fashion   Needle Size:  22 G Ultrasound Guidance: No   Fluoroscopic Guidance: No   Arthrogram: No   Medications:  2 mL lidocaine 1 %; 2 mL bupivacaine 0.5 %; 40 mg methylPREDNISolone acetate 40 MG/ML Patient tolerance:  Patient tolerated the procedure well with no immediate complications

## 2016-05-26 DIAGNOSIS — J069 Acute upper respiratory infection, unspecified: Secondary | ICD-10-CM | POA: Diagnosis not present

## 2016-06-25 NOTE — Progress Notes (Signed)
Patient ID: QUETZALLY CALLAS, female   DOB: [birth year], 76 y.o.   MRN: [id number] MEDICARE ANNUAL WELLNESS VISIT AND 3 month  Assessment:   Essential hypertension - continue medications, DASH diet, exercise and monitor at home. Call if greater than 130/80.  - BASIC METABOLIC PANEL WITH GFR - Hepatic function panel - TSH   Prediabetes Discussed general issues about diabetes pathophysiology and management., Educational material distributed., Suggested low cholesterol diet., Encouraged aerobic exercise., Discussed foot care., Reminded to get yearly retinal exam.   Polycythemia vera - CBC with Differential/Platelet  Mixed hyperlipidemia -continue medications, check lipids, decrease fatty foods, increase activity.  - Lipid panel   Anxiety Anxiety- continue medications, stress management techniques discussed, increase water, good sleep hygiene discussed, increase exercise, and increase veggies.  - citalopram (CELEXA) 40 MG tablet; Take 1 tablet (40 mg total) by mouth daily.  Dispense: 90 tablet; Refill: 1  Medication management - Magnesium   Vitamin D deficiency - Vit D  25 hydroxy (rtn osteoporosis monitoring)  Osteoarthritis, unspecified osteoarthritis type, unspecified site RICE, NSAIDS, exercises given, if not better get xray and PT referral or ortho referral.   Obesity Obesity with co morbidities- long discussion about weight loss, diet, and exercise  COPD (chronic obstructive pulmonary disease) with chronic bronchitis  continue meds.    Insomnia Insomnia- good sleep hygiene discussed, increase day time activity, try melatonin or benadryl if this does not help we will call in sleep medication.  - continue trazodone   Future Appointments Date Time Provider [place]  09/29/2016 11:00 AM Unk Pinto, MD [facility] None      Plan:   During the course of the visit the patient was educated and counseled about appropriate screening and preventive services  including:    Pneumococcal vaccine   Influenza vaccine  Td vaccine  Screening electrocardiogram  Screening mammography  Bone densitometry screening  Colorectal cancer screening  Diabetes screening  Glaucoma screening  Nutrition counseling   Advanced directives: given information/requested   Subjective:   ESTHELA BRANDNER is a 76 y.o. female who presents for Medicare Annual Wellness Visit and 3 month follow up for HTN, chol, and preDM.     Her blood pressure has been controlled at home, on valsartan 1/2 pill, has helped cough, today their BP is BP: 122/64 She does workout. She denies chest pain, shortness of breath, dizziness.  She is on cholesterol medication and denies myalgias. Her cholesterol is not at goal. The cholesterol last visit was:   Lab Results  Component Value Date   CHOL 197 04/05/2016   HDL 89 04/05/2016   LDLCALC 80 04/05/2016   TRIG 142 04/05/2016   CHOLHDL 2.2 04/05/2016   Last A1C in the office was:  Lab Results  Component Value Date   HGBA1C 5.3 04/05/2016  Patient is on Vitamin D supplement.   She has COPD due to being former smoker. She has a history of polycythemia vera and is being monitored Lab Results  Component Value Date   WBC 12.9 (H) 04/05/2016   HGB 15.5 04/05/2016   HCT 45.6 (H) 04/05/2016   MCV 93.6 04/05/2016   PLT 243 04/05/2016  She is on celexa for anxiety, started after her husband passed 6 years ago and think it is still helping, she is on xanax PRN and she is on trazodone. She takes care of her handicap brother and this is extra stress/anxiety.  Following with Dr. Erlinda Hong for knee pain, bone on bone, worse with stairs.  BMI  is Body mass index is 28.67 kg/m., she is working on diet and exercise. Wt Readings from Last 3 Encounters:  06/28/16 177 lb 9.6 oz (80.6 kg)  04/05/16 179 lb (81.2 kg)  03/24/16 180 lb (81.6 kg)    Names of Other Physician/Practitioners you currently use: Patient Care Team: Unk Pinto, MD as  PCP - General (Internal Medicine) Druscilla Brownie, MD as Consulting Physician (Dermatology) Clarene Essex, MD as Consulting Physician (Gastroenterology) Fay Records, MD as Consulting Physician (Cardiology) Leandrew Koyanagi, MD as Attending Physician (Orthopedic Surgery) Orvan Seen, [facility]) Tye Savoy, (Dentist)  Medication Review Current Outpatient Prescriptions on File Prior to Visit  Medication Sig Dispense Refill  . ALPRAZolam (XANAX) 0.5 MG tablet TAKE 1/2 TO 1 TABLET BY MOUTH THREE TIMES A DAY AS NEEDED 270 tablet 1  . aspirin 81 MG EC tablet Take 81 mg by mouth daily.      . cetirizine (ZYRTEC) 10 MG tablet Take 10 mg by mouth as needed.      . Cholecalciferol (VITAMIN D3) 2000 UNITS capsule Take 2,000 Units by mouth daily.      . clobetasol cream (TEMOVATE) 3.29 % Apply 1 application topically 2 (two) times daily.    . clotrimazole-betamethasone (LOTRISONE) cream APPLY TO AFFECTED AREA 2 TIMES DAILY 45 g 1  . conjugated estrogens (PREMARIN) vaginal cream Place 1 Applicatorful vaginally. Use 3 days a week.    . Cranberry ([employer] CRANBERRY) 475 MG CAPS Take 1 capsule by mouth daily.      . diclofenac (VOLTAREN) 75 MG EC tablet Take 1 tablet (75 mg total) by mouth 2 (two) times daily. 90 tablet 3  . fish oil-omega-3 fatty acids 1000 MG capsule Take 2 g by mouth daily.      . Flaxseed, Linseed, (FLAX SEED OIL) 1000 MG CAPS Take 1,000 mg by mouth daily.    . hydrochlorothiazide (HYDRODIURIL) 25 MG tablet Take 1 tablet (25 mg total) by mouth daily. 90 tablet 1  . Magnesium 400 MG CAPS Take 400 mg by mouth daily.    . montelukast (SINGULAIR) 10 MG tablet TAKE ONE TABLET BY MOUTH ONE TIME DAILY 90 tablet 1  . Multiple Vitamin (MULTIVITAMIN) tablet Take 1 tablet by mouth daily.      . NON FORMULARY Calcium 1800 mg 1 tablet daily     . OVER THE COUNTER MEDICATION Otc Mucinex 1 tab daily    . Probiotic Product (PROBIOTIC-10) CAPS Take 1 tablet by mouth daily.    . simvastatin (ZOCOR) 40 MG tablet TAKE 1  TABLET (40 MG TOTAL) BY MOUTH AT BEDTIME. 90 tablet 1  . traZODone (DESYREL) 150 MG tablet TAKE ONE-HALF TO ONE TABLET BY MOUTH ONE HOUR PRIOR TO BEDTIME 90 tablet 1  . valsartan (DIOVAN) 320 MG tablet TAKE 1 TABLET (320 MG TOTAL) BY MOUTH DAILY. 90 tablet 1   No current facility-administered medications on file prior to visit.     Current Problems (verified) Patient Active Problem List   Diagnosis Date Noted  . Depression, major, in remission ([place]) 11/27/2014  . Encounter for Medicare annual wellness exam 08/22/2014  . Obesity 04/10/2014  . COPD (chronic obstructive pulmonary disease) with chronic bronchitis ([place]) 04/10/2014  . Insomnia 04/10/2014  . Vitamin D deficiency 01/07/2014  . Abnormal blood sugar 01/03/2014  . Medication management 01/03/2014  . Anxiety   . Morton's neuroma of left foot   . Osteoarthritis   . Polycythemia vera ([place]) 06/05/2009  . Mixed hyperlipidemia 06/05/2009  . Essential hypertension 06/05/2009  Immunization History  Administered Date(s) Administered  . Influenza Split 11/15/2011, 11/28/2012, 10/31/2013, 01/07/2014  . Influenza, High Dose Seasonal PF 11/27/2014, 11/28/2015  . Pneumococcal Conjugate-13 10/02/2013  . Tdap 08/01/2013  . Zoster 05/25/2011    Preventative care: Last colonoscopy: 05/2012 Last mammogram: 07/19/13 q 2 years DUE  Last pap smear/pelvic exam: 2007  DEXA:07/19/13 osteopenia ECHO: 07/14/09 CXR 08/2014  Prior vaccinations: TDAP: 2015  Influenza: 2016  Pneumococcal: 2004 Prevnar 13: 2015 Shingles/Zostavax: 2013  Allergies Allergies  Allergen Reactions  . Ace Inhibitors     Cough  . Ampicillin     rash  . Mevacor [Lovastatin]     Elevated LFT's  . Penicillins     REACTION: hives and  itching  . Sulfa Antibiotics     rash    SURGICAL HISTORY She  has a past surgical history that includes Tonsillectomy; Vesicovaginal fistula closure w/ TAH; Knee arthroscopy; and Abdominal hysterectomy. FAMILY HISTORY Her  family history includes Cancer in her father; Diabetes in her brother; Heart attack in her mother; Heart disease in her mother; Hyperlipidemia in her brother; Osteoarthritis in her father and mother. SOCIAL HISTORY She  reports that she has never smoked. She has never used smokeless tobacco. She reports that she drinks alcohol.  Physical activity: Current Exercise Habits: Home exercise routine, Type of exercise: walking, Time (Minutes): 20, Frequency (Times/Week): 2, Weekly Exercise (Minutes/Week): 40, Intensity: Mild Cardiac risk factors: Cardiac Risk Factors include: advanced age (>52men, >50 women);dyslipidemia;hypertension;sedentary lifestyle Depression/mood screen:   Depression screen [facility] 2/9 06/28/2016  Decreased Interest 0  Down, Depressed, Hopeless 0  PHQ - 2 Score 0    ADLs:  In your present state of health, do you have any difficulty performing the following activities: 06/28/2016 04/05/2016  Hearing? N N  Vision? N N  Difficulty concentrating or making decisions? N N  Walking or climbing stairs? N N  Dressing or bathing? N N  Doing errands, shopping? N N  Some recent data might be hidden     Cognitive Testing  Alert? Yes  Normal Appearance?Yes  Oriented to person? Yes  Place? Yes   Time? Yes  Recall of three objects?  Yes  Can perform simple calculations? Yes  Displays appropriate judgment?Yes  Can read the correct time from a watch face?Yes  EOL planning: Does Patient Have a Medical Advance Directive?: Yes Type of Advance Directive: Healthcare Power of Attorney, Living will [place] in Chart?: No - copy requested   Objective:     Blood pressure 122/64, pulse (!) 54, temperature 97.9 F (36.6 C), resp. rate 14, height 5\' 6"  (1.676 m), weight 177 lb 9.6 oz (80.6 kg), SpO2 95 %. Body mass index is 28.67 kg/m.  General appearance: alert, no distress, WD/WN,  female HEENT: normocephalic, sclerae anicteric, TMs pearly, nares patent, no  discharge or erythema, pharynx normal Oral cavity: MMM, no lesions Neck: supple, no lymphadenopathy, no thyromegaly, no masses Heart: RRR, normal S1, S2, no murmurs Lungs: CTA bilaterally, no wheezes, rhonchi, or rales Abdomen: +bs, soft, non tender, non distended, no masses, no hepatomegaly, no splenomegaly Musculoskeletal: nontender, no swelling, no obvious deformity Extremities: no edema, no cyanosis, no clubbing Pulses: 2+ symmetric, upper and lower extremities, normal cap refill Neurological: alert, oriented x 3, CN2-12 intact, strength normal upper extremities and lower extremities, sensation normal throughout, DTRs 2+ throughout, no cerebellar signs, gait normal Skin: WNL VULVA: vulvar tenderness , vulvar erythema with large ulcer left labia and smaller ulcer right labia  with white rash/discharge, VAGINA: vaginal erythema Rectal: 5x4mm external tender non thrombosed hemorrhoid at 12 o clock Breasts: breasts appear normal, no suspicious masses, no skin or nipple changes or axillary nodes. Psychiatric: normal affect, behavior normal, pleasant  Breast:  defer Rectal: defer   Medicare Attestation I have personally reviewed: The patient's medical and social history Their use of alcohol, tobacco or illicit drugs Their current medications and supplements The patient's functional ability including ADLs,fall risks, home safety risks, cognitive, and hearing and visual impairment Diet and physical activities Evidence for depression or mood disorders  The patient's weight, height, BMI, and visual acuity have been recorded in the chart.  I have made referrals, counseling, and provided education to the patient based on review of the above and I have provided the patient with a written personalized care plan for preventive services.     Vicie Mutters, PA-C   06/28/2016

## 2016-06-28 ENCOUNTER — Ambulatory Visit (INDEPENDENT_AMBULATORY_CARE_PROVIDER_SITE_OTHER): Payer: Medicare Other | Admitting: Physician Assistant

## 2016-06-28 ENCOUNTER — Encounter: Payer: Self-pay | Admitting: Physician Assistant

## 2016-06-28 VITALS — BP 122/64 | HR 54 | Temp 97.9°F | Resp 14 | Ht 66.0 in | Wt 177.6 lb

## 2016-06-28 DIAGNOSIS — E559 Vitamin D deficiency, unspecified: Secondary | ICD-10-CM | POA: Diagnosis not present

## 2016-06-28 DIAGNOSIS — G4700 Insomnia, unspecified: Secondary | ICD-10-CM

## 2016-06-28 DIAGNOSIS — E782 Mixed hyperlipidemia: Secondary | ICD-10-CM

## 2016-06-28 DIAGNOSIS — Z0001 Encounter for general adult medical examination with abnormal findings: Secondary | ICD-10-CM

## 2016-06-28 DIAGNOSIS — R6889 Other general symptoms and signs: Secondary | ICD-10-CM | POA: Diagnosis not present

## 2016-06-28 DIAGNOSIS — F419 Anxiety disorder, unspecified: Secondary | ICD-10-CM

## 2016-06-28 DIAGNOSIS — M1990 Unspecified osteoarthritis, unspecified site: Secondary | ICD-10-CM

## 2016-06-28 DIAGNOSIS — H53452 Other localized visual field defect, left eye: Secondary | ICD-10-CM | POA: Diagnosis not present

## 2016-06-28 DIAGNOSIS — R7309 Other abnormal glucose: Secondary | ICD-10-CM

## 2016-06-28 DIAGNOSIS — D45 Polycythemia vera: Secondary | ICD-10-CM | POA: Diagnosis not present

## 2016-06-28 DIAGNOSIS — Z0000 Encounter for general adult medical examination without abnormal findings: Secondary | ICD-10-CM

## 2016-06-28 DIAGNOSIS — F325 Major depressive disorder, single episode, in full remission: Secondary | ICD-10-CM

## 2016-06-28 DIAGNOSIS — Z79899 Other long term (current) drug therapy: Secondary | ICD-10-CM | POA: Diagnosis not present

## 2016-06-28 DIAGNOSIS — I10 Essential (primary) hypertension: Secondary | ICD-10-CM | POA: Diagnosis not present

## 2016-06-28 DIAGNOSIS — J449 Chronic obstructive pulmonary disease, unspecified: Secondary | ICD-10-CM | POA: Diagnosis not present

## 2016-06-28 DIAGNOSIS — H25813 Combined forms of age-related cataract, bilateral: Secondary | ICD-10-CM | POA: Diagnosis not present

## 2016-06-28 LAB — CBC WITH DIFFERENTIAL/PLATELET
Basophils Absolute: 70 cells/uL (ref 0–200)
Basophils Relative: 1 %
Eosinophils Absolute: 490 cells/uL (ref 15–500)
Eosinophils Relative: 7 %
HCT: 42 % (ref 35.0–45.0)
Hemoglobin: 14.2 g/dL (ref 11.7–15.5)
Lymphocytes Relative: 23 %
Lymphs Abs: 1610 cells/uL (ref 850–3900)
MCH: 32.6 pg (ref 27.0–33.0)
MCHC: 33.8 g/dL (ref 32.0–36.0)
MCV: 96.3 fL (ref 80.0–100.0)
MPV: 9.5 fL (ref 7.5–12.5)
Monocytes Absolute: 490 cells/uL (ref 200–950)
Monocytes Relative: 7 %
Neutro Abs: 4340 cells/uL (ref 1500–7800)
Neutrophils Relative %: 62 %
Platelets: 216 10*3/uL (ref 140–400)
RBC: 4.36 MIL/uL (ref 3.80–5.10)
RDW: 13.6 % (ref 11.0–15.0)
WBC: 7 10*3/uL (ref 3.8–10.8)

## 2016-06-28 NOTE — Patient Instructions (Signed)
[place]   Simple math prevails.    1st - exercise does not produce significant weight loss - at best one converts fat into muscle , "bulks up", loses inches, but usually stays "weight neutral"     2nd - think of your body weightas a check book: If you eat more calories than you burn up - you save money or gain weight .... Or if you spend more money than you put in the check book, ie burn up more calories than you eat, then you lose weight     3rd - if you walk or run 1 mile, you burn up 100 calories - you have to burn up 3,500 calories to lose 1 pound, ie you have to walk/run 35 miles to lose 1 measly pound. So if you want to lose 10 #, then you have to walk/run 350 miles, so.... clearly exercise is not the solution.     4. So if you consume 1,500 calories, then you have to burn up the equivalent of 15 miles to stay weight neutral - It also stands to reason that if you consume 1,500 cal/day and don't lose weight, then you must be burning up about 1,500 cals/day to stay weight neutral.     5. If you really want to lose weight, you must cut your calorie intake 300 calories /day and at that rate you should lose about 1 # every 3 days.   6. Please purchase Dr Fara Olden Fuhrman's book(s) "The End of Dieting" & "Eat to Live" . It has some great concepts and recipes.

## 2016-06-29 LAB — BASIC METABOLIC PANEL WITH GFR
BUN: 17 mg/dL (ref 7–25)
CO2: 26 mmol/L (ref 20–31)
Calcium: 9.6 mg/dL (ref 8.6–10.4)
Chloride: 98 mmol/L (ref 98–110)
Creat: 0.71 mg/dL (ref 0.60–0.93)
GFR, Est African American: >89 mL/min (ref >=60)
GFR, Est Non African American: 84 mL/min (ref >=60)
Glucose, Bld: 90 mg/dL (ref 65–99)
Potassium: 4 mmol/L (ref 3.5–5.3)
Sodium: 137 mmol/L (ref 135–146)

## 2016-06-29 LAB — LIPID PANEL
Cholesterol: 184 mg/dL (ref <200)
HDL: 60 mg/dL (ref >50)
LDL Cholesterol: 106 mg/dL — ABNORMAL HIGH (ref <100)
Total CHOL/HDL Ratio: 3.1 Ratio (ref <5.0)
Triglycerides: 92 mg/dL (ref <150)
VLDL: 18 mg/dL (ref <30)

## 2016-06-29 LAB — HEPATIC FUNCTION PANEL
ALT: 41 U/L — ABNORMAL HIGH (ref 6–29)
AST: 30 U/L (ref 10–35)
Albumin: 4.2 g/dL (ref 3.6–5.1)
Alkaline Phosphatase: 93 U/L (ref 33–130)
Bilirubin, Direct: 0.1 mg/dL (ref <=0.2)
Indirect Bilirubin: 0.6 mg/dL (ref 0.2–1.2)
Total Bilirubin: 0.7 mg/dL (ref 0.2–1.2)
Total Protein: 6.1 g/dL (ref 6.1–8.1)

## 2016-06-29 LAB — MAGNESIUM: Magnesium: 1.9 mg/dL (ref 1.5–2.5)

## 2016-06-29 LAB — TSH: TSH: 2.29 mIU/L

## 2016-06-29 LAB — HEMOGLOBIN A1C
Hgb A1c MFr Bld: 5.2 % (ref <5.7)
Mean Plasma Glucose: 103 mg/dL

## 2016-06-29 LAB — VITAMIN D 25 HYDROXY (VIT D DEFICIENCY, FRACTURES): Vit D, 25-Hydroxy: 57 ng/mL (ref 30–100)

## 2016-06-29 NOTE — Progress Notes (Signed)
Pt aware of lab results & voiced understanding of those results.

## 2016-07-13 ENCOUNTER — Other Ambulatory Visit: Payer: Self-pay | Admitting: Internal Medicine

## 2016-07-14 DIAGNOSIS — D225 Melanocytic nevi of trunk: Secondary | ICD-10-CM | POA: Diagnosis not present

## 2016-07-14 DIAGNOSIS — L814 Other melanin hyperpigmentation: Secondary | ICD-10-CM | POA: Diagnosis not present

## 2016-07-14 DIAGNOSIS — L820 Inflamed seborrheic keratosis: Secondary | ICD-10-CM | POA: Diagnosis not present

## 2016-07-14 DIAGNOSIS — L821 Other seborrheic keratosis: Secondary | ICD-10-CM | POA: Diagnosis not present

## 2016-07-22 ENCOUNTER — Other Ambulatory Visit: Payer: Self-pay | Admitting: Internal Medicine

## 2016-07-22 DIAGNOSIS — F419 Anxiety disorder, unspecified: Secondary | ICD-10-CM

## 2016-07-22 NOTE — Telephone Encounter (Signed)
Please call Alpraz  

## 2016-07-29 ENCOUNTER — Telehealth: Payer: Self-pay | Admitting: Physician Assistant

## 2016-07-29 MED ORDER — PROMETHAZINE-DM 6.25-15 MG/5ML PO SYRP: 5.0000 mL | ORAL_SOLUTION | Freq: Four times a day (QID) | ORAL | 1 refills | Status: DC | PRN

## 2016-07-29 MED ORDER — AZITHROMYCIN 250 MG PO TABS: ORAL_TABLET | ORAL | 1 refills | Status: AC

## 2016-07-29 MED ORDER — PREDNISONE 20 MG PO TABS: ORAL_TABLET | ORAL | 0 refills | Status: DC

## 2016-07-29 NOTE — Telephone Encounter (Signed)
Patient also complains  of possible sinusitis. Symptoms include congestion, low grade fever, non productive cough, post nasal drip and sinus pressure. Onset of symptoms was 6 days ago, and has been gradually worsening since that time. Treatment to date: antihistamines, cough suppressants and decongestants.  Will send in zpak and prendisone and cough syrup for at night, get on allergy pill and flonase, if not better 7-10 days make follow up office visit.

## 2016-07-29 NOTE — Telephone Encounter (Signed)
Pt was made aware of the Rx that was sent to pharmacy & pt understands to call & made an appt if she is not better in 7-10 days.

## 2016-09-07 DIAGNOSIS — N3020 Other chronic cystitis without hematuria: Secondary | ICD-10-CM | POA: Diagnosis not present

## 2016-09-07 DIAGNOSIS — N952 Postmenopausal atrophic vaginitis: Secondary | ICD-10-CM | POA: Diagnosis not present

## 2016-09-07 DIAGNOSIS — R3121 Asymptomatic microscopic hematuria: Secondary | ICD-10-CM | POA: Diagnosis not present

## 2016-09-14 ENCOUNTER — Encounter: Payer: Self-pay | Admitting: Internal Medicine

## 2016-09-20 ENCOUNTER — Other Ambulatory Visit: Payer: Self-pay | Admitting: Physician Assistant

## 2016-09-26 ENCOUNTER — Other Ambulatory Visit: Payer: Self-pay | Admitting: Internal Medicine

## 2016-09-26 DIAGNOSIS — I10 Essential (primary) hypertension: Secondary | ICD-10-CM

## 2016-09-29 ENCOUNTER — Encounter: Payer: Self-pay | Admitting: Internal Medicine

## 2016-09-29 ENCOUNTER — Ambulatory Visit (INDEPENDENT_AMBULATORY_CARE_PROVIDER_SITE_OTHER): Payer: Medicare Other | Admitting: Internal Medicine

## 2016-09-29 VITALS — BP 142/80 | HR 64 | Temp 97.3°F | Resp 16 | Ht 65.5 in | Wt 177.0 lb

## 2016-09-29 DIAGNOSIS — E559 Vitamin D deficiency, unspecified: Secondary | ICD-10-CM

## 2016-09-29 DIAGNOSIS — I10 Essential (primary) hypertension: Secondary | ICD-10-CM

## 2016-09-29 DIAGNOSIS — Z0001 Encounter for general adult medical examination with abnormal findings: Secondary | ICD-10-CM | POA: Diagnosis not present

## 2016-09-29 DIAGNOSIS — E782 Mixed hyperlipidemia: Secondary | ICD-10-CM | POA: Diagnosis not present

## 2016-09-29 DIAGNOSIS — R7303 Prediabetes: Secondary | ICD-10-CM

## 2016-09-29 DIAGNOSIS — Z136 Encounter for screening for cardiovascular disorders: Secondary | ICD-10-CM | POA: Diagnosis not present

## 2016-09-29 DIAGNOSIS — Z79899 Other long term (current) drug therapy: Secondary | ICD-10-CM | POA: Diagnosis not present

## 2016-09-29 DIAGNOSIS — Z1212 Encounter for screening for malignant neoplasm of rectum: Secondary | ICD-10-CM

## 2016-09-29 MED ORDER — LOSARTAN POTASSIUM 100 MG PO TABS: ORAL_TABLET | ORAL | 1 refills | Status: DC

## 2016-09-29 NOTE — Progress Notes (Signed)
[facility] Unk Pinto, M.D.      Uvaldo Bristle. Silverio Lay, P.A.-C [facility]                [address] [place], [place] [address] Telephone [phone] Telefax [phone]  Annual Screening/Preventative Visit & Comprehensive Evaluation &  Examination     This very nice 76 y.o. [facility] presents for a Screening/Preventative Visit & comprehensive evaluation and management of multiple medical co-morbidities.  Patient has been followed for HTN, T2_NIDDM  Prediabetes, Hyperlipidemia and Vitamin D Deficiency.      HTN predates circa 1996. Patient's BP has been controlled at home and patient denies any cardiac symptoms as chest pain, palpitations, shortness of breath, dizziness or ankle swelling. Today's BP is sl elevated 140/80.      Patient's hyperlipidemia is controlled with diet and medications. Patient denies myalgias or other medication SE's. Last lipids were not at goal: Lab Results  Component Value Date   CHOL 184 06/28/2016   HDL 60 06/28/2016   LDLCALC 106 (H) 06/28/2016   TRIG 92 06/28/2016   CHOLHDL 3.1 06/28/2016      Patient has prediabetes (A1c 5.8% in 2015 and 5.3% in 05/2015)  and patient denies reactive hypoglycemic symptoms, visual blurring, diabetic polys, or paresthesias. Last A1c was at goal: Lab Results  Component Value Date   HGBA1C 5.2 06/28/2016      Finally, patient has history of Vitamin D Deficiency and last Vitamin D was near goal (70-100): Lab Results  Component Value Date   VD25OH 57 06/28/2016   Current Outpatient Prescriptions on File Prior to Visit  Medication Sig  . ALPRAZolam (XANAX) 0.5 MG tablet TAKE 1/2 TO 1 TABLET BY MOUTH THREE TIMES A DAY AS NEEDED FOR ANXIETY  . aspirin 81 MG EC tablet Take 81 mg by mouth daily.    . cetirizine (ZYRTEC) 10 MG tablet Take 10 mg by mouth as needed.    . Cholecalciferol (VITAMIN D3) 2000 UNITS capsule Take 2,000 Units by mouth  daily.    . citalopram (CELEXA) 40 MG tablet TAKE 1 TABLET BY MOUTH EVERY DAY  . clobetasol cream (TEMOVATE) 5.57 % Apply 1 application topically 2 (two) times daily.  . clotrimazole-betamethasone (LOTRISONE) cream APPLY TO AFFECTED AREA 2 TIMES DAILY  . conjugated estrogens (PREMARIN) vaginal cream Place 1 Applicatorful vaginally. Use 3 days a week.  . diclofenac (VOLTAREN) 75 MG EC tablet Take 1 tablet (75 mg total) by mouth 2 (two) times daily.  . Flaxseed, Linseed, (FLAX SEED OIL) 1000 MG CAPS Take 1,000 mg by mouth daily.  . hydrochlorothiazide (HYDRODIURIL) 25 MG tablet TAKE 1 TABLET BY MOUTH EVERY DAY  . Magnesium 400 MG CAPS Take 400 mg by mouth daily.  . montelukast (SINGULAIR) 10 MG tablet TAKE ONE TABLET BY MOUTH ONE TIME DAILY  . Multiple Vitamin (MULTIVITAMIN) tablet Take 1 tablet by mouth daily.    . NON FORMULARY Calcium 1800 mg 1 tablet daily   . OVER THE COUNTER MEDICATION Otc Mucinex 1 tab daily  . Probiotic Product (PROBIOTIC-10) CAPS Take 1 tablet by mouth daily.  . promethazine-dextromethorphan (PROMETHAZINE-DM) 6.25-15 MG/5ML syrup Take 5 mLs by mouth 4 (four) times daily as needed for cough.  . simvastatin (ZOCOR) 40 MG tablet TAKE 1 TABLET (40 MG TOTAL) BY MOUTH AT BEDTIME.  . traZODone (DESYREL) 150 MG tablet TAKE ONE-HALF TO ONE  TABLET BY MOUTH ONE HOUR PRIOR TO BEDTIME  . valsartan (DIOVAN) 320 MG tablet TAKE 1 TABLET (320 MG TOTAL) BY MOUTH DAILY.   No current facility-administered medications on file prior to visit.    Allergies  Allergen Reactions  . Ace Inhibitors     Cough  . Ampicillin     rash  . Mevacor [Lovastatin]     Elevated LFT's  . Penicillins     REACTION: hives and  itching  . Sulfa Antibiotics     rash   Past Medical History:  Diagnosis Date  . Anxiety   . Dyslipidemia    on Rx x20 years  . HTN (hypertension)   . Hyperlipidemia   . Insomnia   . Morton's neuroma of left foot   . Multiple allergies   . Osteoarthritis   .  Polycythemia   . PVC (premature ventricular contraction)    Health Maintenance  Topic Date Due  . PNA vac Low Risk Adult (2 of 2 - PPSV23) 10/03/2014  . INFLUENZA VACCINE  09/08/2016  . COLONOSCOPY  05/18/2022  . TETANUS/TDAP  08/02/2023  . DEXA SCAN  Completed   Immunization History  Administered Date(s) Administered  . Influenza Split 11/15/2011, 11/28/2012, 10/31/2013, 01/07/2014  . Influenza, High Dose Seasonal PF 11/27/2014, 11/28/2015  . Pneumococcal Conjugate-13 10/02/2013  . Tdap 08/01/2013  . Zoster 05/25/2011   Past Surgical History:  Procedure Laterality Date  . ABDOMINAL HYSTERECTOMY    . KNEE ARTHROSCOPY     R  . TONSILLECTOMY    . VESICOVAGINAL FISTULA CLOSURE W/ TAH     Family History  Problem Relation Age of Onset  . Osteoarthritis Mother   . Heart attack Mother   . Heart disease Mother   . Cancer Father        lung  . Osteoarthritis Father   . Hyperlipidemia Brother   . Diabetes Brother    Social History  Substance Use Topics  . Smoking status: Never Smoker  . Smokeless tobacco: Never Used     Comment: no tobacco   . Alcohol use Yes     Comment: rare    ROS Constitutional: Denies fever, chills, weight loss/gain, headaches, insomnia,  night sweats, and change in appetite. Does c/o fatigue. Eyes: Denies redness, blurred vision, diplopia, discharge, itchy, watery eyes.  ENT: Denies discharge, congestion, post nasal drip, epistaxis, sore throat, earache, hearing loss, dental pain, Tinnitus, Vertigo, Sinus pain, snoring.  Cardio: Denies chest pain, palpitations, irregular heartbeat, syncope, dyspnea, diaphoresis, orthopnea, PND, claudication, edema Respiratory: denies cough, dyspnea, DOE, pleurisy, hoarseness, laryngitis, wheezing.  Gastrointestinal: Denies dysphagia, heartburn, reflux, water brash, pain, cramps, nausea, vomiting, bloating, diarrhea, constipation, hematemesis, melena, hematochezia, jaundice, hemorrhoids Genitourinary: Denies dysuria,  frequency, urgency, nocturia, hesitancy, discharge, hematuria, flank pain Breast: Breast lumps, nipple discharge, bleeding.  Musculoskeletal: Denies arthralgia, myalgia, stiffness, Jt. Swelling, pain, limp, and strain/sprain. Denies falls. Skin: Denies puritis, rash, hives, warts, acne, eczema, changing in skin lesion Neuro: No weakness, tremor, incoordination, spasms, paresthesia, pain Psychiatric: Denies confusion, memory loss, sensory loss. Denies Depression. Endocrine: Denies change in weight, skin, hair change, nocturia, and paresthesia, diabetic polys, visual blurring, hyper / hypo glycemic episodes.  Heme/Lymph: No excessive bleeding, bruising, enlarged lymph nodes.  Physical Exam  BP (!) 142/80   Pulse 64   Temp (!) 97.3 F (36.3 C)   Resp 16   Ht 5' 5.5" (1.664 m)   Wt 177 lb (80.3 kg)   BMI 29.01 kg/m   General Appearance: Well  nourished, well groomed and in no apparent distress.  Eyes: PERRLA, EOMs, conjunctiva no swelling or erythema, normal fundi and vessels. Sinuses: No frontal/maxillary tenderness ENT/Mouth: EACs patent / TMs  nl. Nares clear without erythema, swelling, mucoid exudates. Oral hygiene is good. No erythema, swelling, or exudate. Tongue normal, non-obstructing. Tonsils not swollen or erythematous. Hearing normal.  Neck: Supple, thyroid normal. No bruits, nodes or JVD. Respiratory: Respiratory effort normal.  BS equal and clear bilateral without rales, rhonci, wheezing or stridor. Cardio: Heart sounds are normal with regular rate and rhythm and no murmurs, rubs or gallops. Peripheral pulses are normal and equal bilaterally without edema. No aortic or femoral bruits. Chest: symmetric with normal excursions and percussion. Breasts: Symmetric, without lumps, nipple discharge, retractions, or fibrocystic changes.  Abdomen: Flat, soft with bowel sounds active. Nontender, no guarding, rebound, hernias, masses, or organomegaly.  Lymphatics: Non tender without  lymphadenopathy.  Genitourinary:  Musculoskeletal: Full ROM all peripheral extremities, joint stability, 5/5 strength, and normal gait. Skin: Warm and dry without rashes, lesions, cyanosis, clubbing or  ecchymosis.  Neuro: Cranial nerves intact, reflexes equal bilaterally. Normal muscle tone, no cerebellar symptoms. Sensation intact.  Pysch: Alert and oriented X 3, normal affect, Insight and Judgment appropriate.   Assessment and Plan  1. Annual Preventative Screening Examination   2. Essential hypertension  - EKG 12-Lead - Urinalysis, Routine w reflex microscopic - Microalbumin / creatinine urine ratio - CBC with Differential/Platelet - BASIC METABOLIC PANEL WITH GFR - Magnesium - TSH - losartan (COZAAR) 100 MG tablet; Take 1 tablet daily for BP  Dispense: 90 tablet; Refill: 1  3. Hyperlipidemia, mixed  - EKG 12-Lead - Hepatic function panel - Lipid panel - TSH  4. Prediabetes  - EKG 12-Lead - Hemoglobin A1c - Insulin, fasting  5. Vitamin D deficiency  - VITAMIN D 25 Hydroxy   6. Screening for rectal cancer  - POC Hemoccult Bld/Stl  7. Screening for ischemic heart disease  - EKG 12-Lead  8. Medication management  - Urinalysis, Routine w reflex microscopic - Microalbumin / creatinine urine ratio - CBC with Differential/Platelet - BASIC METABOLIC PANEL WITH GFR - Hepatic function panel - Magnesium - Lipid panel - TSH - Hemoglobin A1c - Insulin, fasting - VITAMIN D 25 Hydroxy        Patient was counseled in prudent diet to achieve/maintain BMI less than 25 for weight control, BP monitoring, regular exercise and medications. Discussed med's effects and SE's. Screening labs and tests as requested with regular follow-up as recommended. Over 40 minutes of exam, counseling, chart review and high complex critical decision making was performed.

## 2016-09-29 NOTE — Patient Instructions (Signed)

## 2016-09-30 LAB — HEMOGLOBIN A1C
Hgb A1c MFr Bld: 5.3 % of total Hgb (ref <5.7)
Mean Plasma Glucose: 105 (calc)
eAG (mmol/L): 5.8 (calc)

## 2016-09-30 LAB — BASIC METABOLIC PANEL WITH GFR
BUN: 16 mg/dL (ref 7–25)
CO2: 32 mmol/L (ref 20–32)
Calcium: 9.9 mg/dL (ref 8.6–10.4)
Chloride: 96 mmol/L — ABNORMAL LOW (ref 98–110)
Creat: 0.89 mg/dL (ref 0.60–0.93)
GFR, Est African American: 73 mL/min/{1.73_m2} (ref >=60)
GFR, Est Non African American: 63 mL/min/{1.73_m2} (ref >=60)
Glucose, Bld: 88 mg/dL (ref 65–99)
Potassium: 3.7 mmol/L (ref 3.5–5.3)
Sodium: 137 mmol/L (ref 135–146)

## 2016-09-30 LAB — URINALYSIS, ROUTINE W REFLEX MICROSCOPIC
Bacteria, UA: NONE SEEN /HPF
Bilirubin Urine: NEGATIVE
Glucose, UA: NEGATIVE
Hyaline Cast: NONE SEEN /LPF
Ketones, ur: NEGATIVE
Leukocytes, UA: NEGATIVE
Nitrite: NEGATIVE
Specific Gravity, Urine: 1.008 (ref 1.001–1.03)
Squamous Epithelial / LPF: NONE SEEN /HPF (ref <=5)
WBC, UA: NONE SEEN /HPF (ref 0–5)
pH: 7 (ref 5.0–8.0)

## 2016-09-30 LAB — VITAMIN D 25 HYDROXY (VIT D DEFICIENCY, FRACTURES): Vit D, 25-Hydroxy: 63 ng/mL (ref 30–100)

## 2016-09-30 LAB — CBC WITH DIFFERENTIAL/PLATELET
Basophils Absolute: 59 cells/uL (ref 0–200)
Basophils Relative: 0.9 %
Eosinophils Absolute: 323 cells/uL (ref 15–500)
Eosinophils Relative: 4.9 %
HCT: 39.6 % (ref 35.0–45.0)
Hemoglobin: 13.6 g/dL (ref 11.7–15.5)
Lymphs Abs: 1551 cells/uL (ref 850–3900)
MCH: 32.1 pg (ref 27.0–33.0)
MCHC: 34.3 g/dL (ref 32.0–36.0)
MCV: 93.4 fL (ref 80.0–100.0)
MPV: 10.2 fL (ref 7.5–12.5)
Monocytes Relative: 8.7 %
Neutro Abs: 4092 cells/uL (ref 1500–7800)
Neutrophils Relative %: 62 %
Platelets: 240 10*3/uL (ref 140–400)
RBC: 4.24 10*6/uL (ref 3.80–5.10)
RDW: 12.4 % (ref 11.0–15.0)
Total Lymphocyte: 23.5 %
WBC mixed population: 574 cells/uL (ref 200–950)
WBC: 6.6 10*3/uL (ref 3.8–10.8)

## 2016-09-30 LAB — LIPID PANEL
Cholesterol: 181 mg/dL (ref <200)
HDL: 55 mg/dL (ref >50)
LDL Cholesterol (Calc): 102 mg/dL (calc) — ABNORMAL HIGH
Non-HDL Cholesterol (Calc): 126 mg/dL (calc) (ref <130)
Total CHOL/HDL Ratio: 3.3 (calc) (ref <5.0)
Triglycerides: 138 mg/dL (ref <150)

## 2016-09-30 LAB — HEPATIC FUNCTION PANEL
AG Ratio: 2 (calc) (ref 1.0–2.5)
ALT: 33 U/L — ABNORMAL HIGH (ref 6–29)
AST: 28 U/L (ref 10–35)
Albumin: 4.2 g/dL (ref 3.6–5.1)
Alkaline phosphatase (APISO): 89 U/L (ref 33–130)
Bilirubin, Direct: 0.2 mg/dL (ref 0.0–0.2)
Globulin: 2.1 g/dL (calc) (ref 1.9–3.7)
Indirect Bilirubin: 0.8 mg/dL (calc) (ref 0.2–1.2)
Total Bilirubin: 1 mg/dL (ref 0.2–1.2)
Total Protein: 6.3 g/dL (ref 6.1–8.1)

## 2016-09-30 LAB — TSH: TSH: 1.99 mIU/L (ref 0.40–4.50)

## 2016-09-30 LAB — MICROALBUMIN / CREATININE URINE RATIO
Creatinine, Urine: 31 mg/dL (ref 20–320)
Microalb Creat Ratio: 294 mcg/mg creat — ABNORMAL HIGH (ref <30)
Microalb, Ur: 9.1 mg/dL

## 2016-09-30 LAB — MAGNESIUM: Magnesium: 1.9 mg/dL (ref 1.5–2.5)

## 2016-10-01 ENCOUNTER — Other Ambulatory Visit: Payer: Medicare Other

## 2016-10-01 DIAGNOSIS — R8271 Bacteriuria: Secondary | ICD-10-CM

## 2016-10-01 LAB — INSULIN, FASTING: Insulin: 5.4 u[IU]/mL (ref 2.0–19.6)

## 2016-10-04 NOTE — Progress Notes (Signed)
Pt aware of lab results & voiced understanding of those results.

## 2016-10-07 LAB — URINE CULTURE
MICRO NUMBER:: 80926381
Result:: NO GROWTH
SPECIMEN QUALITY:: ADEQUATE

## 2016-10-08 ENCOUNTER — Other Ambulatory Visit: Payer: Self-pay | Admitting: Physician Assistant

## 2016-10-15 ENCOUNTER — Other Ambulatory Visit: Payer: Self-pay | Admitting: Internal Medicine

## 2016-10-21 ENCOUNTER — Other Ambulatory Visit: Payer: Self-pay | Admitting: Internal Medicine

## 2016-10-21 DIAGNOSIS — F419 Anxiety disorder, unspecified: Secondary | ICD-10-CM

## 2016-10-22 NOTE — Telephone Encounter (Signed)
Xanax called into pharmacy on 13th sept 2018 by dd

## 2016-10-22 NOTE — Progress Notes (Signed)
Xanax called into pharmacy on 14th Sept 2018 by DD

## 2016-11-15 ENCOUNTER — Encounter: Payer: Self-pay | Admitting: Internal Medicine

## 2016-11-24 ENCOUNTER — Other Ambulatory Visit: Payer: Self-pay

## 2016-11-24 DIAGNOSIS — Z1212 Encounter for screening for malignant neoplasm of rectum: Secondary | ICD-10-CM

## 2016-11-24 LAB — POC HEMOCCULT BLD/STL (HOME/3-CARD/SCREEN)
Card #2 Fecal Occult Blod, POC: NEGATIVE
Card #3 Fecal Occult Blood, POC: NEGATIVE
Fecal Occult Blood, POC: NEGATIVE

## 2016-12-03 ENCOUNTER — Other Ambulatory Visit: Payer: Self-pay | Admitting: Internal Medicine

## 2016-12-03 MED ORDER — PREDNISONE 20 MG PO TABS: ORAL_TABLET | ORAL | 0 refills | Status: DC

## 2016-12-06 ENCOUNTER — Other Ambulatory Visit (INDEPENDENT_AMBULATORY_CARE_PROVIDER_SITE_OTHER): Payer: Self-pay | Admitting: Orthopaedic Surgery

## 2016-12-10 DIAGNOSIS — Z1231 Encounter for screening mammogram for malignant neoplasm of breast: Secondary | ICD-10-CM | POA: Diagnosis not present

## 2016-12-10 LAB — HM MAMMOGRAPHY

## 2016-12-23 DIAGNOSIS — H53452 Other localized visual field defect, left eye: Secondary | ICD-10-CM | POA: Diagnosis not present

## 2016-12-23 DIAGNOSIS — H25813 Combined forms of age-related cataract, bilateral: Secondary | ICD-10-CM | POA: Diagnosis not present

## 2016-12-28 ENCOUNTER — Encounter: Payer: Self-pay | Admitting: *Deleted

## 2016-12-28 ENCOUNTER — Ambulatory Visit (INDEPENDENT_AMBULATORY_CARE_PROVIDER_SITE_OTHER): Payer: Medicare Other | Admitting: Orthopaedic Surgery

## 2016-12-28 DIAGNOSIS — M1711 Unilateral primary osteoarthritis, right knee: Secondary | ICD-10-CM

## 2016-12-28 DIAGNOSIS — M1712 Unilateral primary osteoarthritis, left knee: Secondary | ICD-10-CM | POA: Diagnosis not present

## 2016-12-28 MED ORDER — LIDOCAINE HCL 1 % IJ SOLN
2.0000 mL | INTRAMUSCULAR | Status: AC | PRN
  Administered 2016-12-28: 2 mL

## 2016-12-28 MED ORDER — BUPIVACAINE HCL 0.5 % IJ SOLN
2.0000 mL | INTRAMUSCULAR | Status: AC | PRN
  Administered 2016-12-28: 2 mL via INTRA_ARTICULAR

## 2016-12-28 MED ORDER — METHYLPREDNISOLONE ACETATE 40 MG/ML IJ SUSP
40.0000 mg | INTRAMUSCULAR | Status: AC | PRN
  Administered 2016-12-28: 40 mg via INTRA_ARTICULAR

## 2016-12-28 NOTE — Progress Notes (Signed)
Office Visit Note   Patient: Kelsey Lopez           Date of Birth: Nov 02, 1940           MRN: [id number] Visit Date: 12/28/2016              Requested by: Unk Pinto, [place] [address] [address], [place] [address] PCP: Unk Pinto, MD   Assessment & Plan: Visit Diagnoses:  1. Unilateral primary osteoarthritis, left knee   2. Unilateral primary osteoarthritis, right knee     Plan: Impression is bilateral knee degenerative joint disease.  Bilateral knee injections were performed today.  Patient still getting significant relief from cortisone injections follow-up as needed.  Follow-Up Instructions: Return if symptoms worsen or fail to improve.   Orders:  No orders of the defined types were placed in this encounter.  No orders of the defined types were placed in this encounter.     Procedures: Large Joint Inj: bilateral knee on 12/28/2016 4:08 PM Indications: pain Details: 22 G needle  Arthrogram: No  Medications (Right): 2 mL lidocaine 1 %; 2 mL bupivacaine 0.5 %; 40 mg methylPREDNISolone acetate 40 MG/ML Medications (Left): 2 mL lidocaine 1 %; 2 mL bupivacaine 0.5 %; 40 mg methylPREDNISolone acetate 40 MG/ML Outcome: tolerated well, no immediate complications Patient was prepped and draped in the usual sterile fashion.       Clinical Data: No additional findings.   Subjective: No chief complaint on file.   Kelsey Lopez comes in today for worsening bilateral knee pain.  This is caused her to fall multiple times due to the pain and giving way.  She last received an injection in March which has helped significantly up until recently.  She is requesting bilateral knee injections today.    Review of Systems   Objective: Vital Signs: There were no vitals taken for this visit.  Physical Exam  Ortho Exam Bilateral knee exam shows no joint effusion.  Collaterals and cruciates are stable. Specialty Comments:  No specialty comments  available.  Imaging: No results found.   PMFS History: Patient Active Problem List   Diagnosis Date Noted  . Depression, major, in remission ([place]) 11/27/2014  . Obesity 04/10/2014  . COPD (chronic obstructive pulmonary disease) with chronic bronchitis ([place]) 04/10/2014  . Insomnia 04/10/2014  . Vitamin D deficiency 01/07/2014  . Abnormal blood sugar 01/03/2014  . Medication management 01/03/2014  . Anxiety   . Morton's neuroma of left foot   . Osteoarthritis   . Mixed hyperlipidemia 06/05/2009  . Essential hypertension 06/05/2009   Past Medical History:  Diagnosis Date  . Anxiety   . Dyslipidemia    on Rx x20 years  . HTN (hypertension)   . Hyperlipidemia   . Insomnia   . Morton's neuroma of left foot   . Multiple allergies   . Osteoarthritis   . Polycythemia   . PVC (premature ventricular contraction)     Family History  Problem Relation Age of Onset  . Osteoarthritis Mother   . Heart attack Mother   . Heart disease Mother   . Cancer Father        lung  . Osteoarthritis Father   . Hyperlipidemia Brother   . Diabetes Brother     Past Surgical History:  Procedure Laterality Date  . ABDOMINAL HYSTERECTOMY    . KNEE ARTHROSCOPY     R  . TONSILLECTOMY    . VESICOVAGINAL FISTULA CLOSURE W/ TAH     Social  History   Occupational History  . Not on file  Tobacco Use  . Smoking status: Never Smoker  . Smokeless tobacco: Never Used  . Tobacco comment: no tobacco   Substance and Sexual Activity  . Alcohol use: Yes    Comment: rare  . Drug use: Not on file  . Sexual activity: Not on file

## 2017-01-02 ENCOUNTER — Other Ambulatory Visit (INDEPENDENT_AMBULATORY_CARE_PROVIDER_SITE_OTHER): Payer: Self-pay | Admitting: Orthopaedic Surgery

## 2017-01-02 MED ORDER — TIZANIDINE HCL 4 MG PO TABS: 2.0000 mg | ORAL_TABLET | Freq: Three times a day (TID) | ORAL | 2 refills | Status: DC | PRN

## 2017-01-04 ENCOUNTER — Other Ambulatory Visit: Payer: Self-pay | Admitting: Internal Medicine

## 2017-01-12 NOTE — Progress Notes (Signed)
FOLLOW UP  Assessment and Plan:   Hypertension Well controlled with current medications  Monitor blood pressure at home; patient to call if consistently greater than 130/80 Continue DASH diet.   Reminder to go to the ER if any CP, SOB, nausea, dizziness, severe HA, changes vision/speech, left arm numbness and tingling and jaw pain.  Cholesterol Continue medication  Continue low cholesterol diet and exercise.  Check lipid panel.   Abnormal glucose Continue diet and exercise.  Perform daily foot/skin check, notify office of any concerning changes.  Check BMP, monitor A1C intermittently  Overweight Long discussion about weight loss, diet, and exercise Recommended diet heavy in fruits and veggies and low in animal meats, cheeses, and dairy products, appropriate calorie intake Discussed appropriate weight for height Follow up at next visit  Depression/anxiety Continue medications Lifestyle discussed: diet/exerise, sleep hygiene, stress management, hydration  COPD  Mild symptoms; not currently on treatment and doing well   Vitamin D Def/ osteoporosis prevention Continue supplementation Check Vit D level  Continue diet and meds as discussed. Further disposition pending results of labs. Discussed med's effects and SE's.   Over 30 minutes of exam, counseling, chart review, and critical decision making was performed.   Future Appointments  Date Time Provider [place]  04/20/2017  2:30 PM Unk Pinto, MD [facility] None  11/08/2017 10:00 AM Unk Pinto, MD [facility] None    ----------------------------------------------------------------------------------------------------------------------  HPI 76 y.o. female  presents for 3 month follow up on hypertension, cholesterol, abnormal glucose, COPD, depression currently in remission, weight and vitamin D deficiency. She has been having ongoing issues with bilateral knee issues related to osteoarthritis; she  receives routine steroid shots for this and is followed by Dr. Erlinda Hong.   she has a diagnosis of depression currently in remission, and is currently on celexa 40 mg, reports symptoms continue to be well controlled on current regimen.   COPD: reports very mild symptoms,  doing well without treatment, denies SOB on exertion  BMI is Body mass index is 28.35 kg/m., she has been working on diet and exercise. Wt Readings from Last 3 Encounters:  01/13/17 173 lb (78.5 kg)  09/29/16 177 lb (80.3 kg)  06/28/16 177 lb 9.6 oz (80.6 kg)   Her blood pressure has been controlled at home, today their BP is BP: 126/76  She does workout. She denies chest pain, shortness of breath, dizziness.   She is on cholesterol medication and denies myalgias. Her cholesterol is at goal. The cholesterol last visit was:   Lab Results  Component Value Date   CHOL 181 09/29/2016   HDL 55 09/29/2016   LDLCALC 106 (H) 06/28/2016   TRIG 138 09/29/2016   CHOLHDL 3.3 09/29/2016    She has been working on diet and exercise for abnormal glucose, and denies increased appetite, nausea, paresthesia of the feet, polydipsia, polyuria, visual disturbances and vomiting. Last A1C in the office was:  Lab Results  Component Value Date   HGBA1C 5.3 09/29/2016   Patient is on Vitamin D supplement and near goal:   Lab Results  Component Value Date   VD25OH 63 09/29/2016        Current Medications:  Current Outpatient Medications on File Prior to Visit  Medication Sig  . ALPRAZolam (XANAX) 0.5 MG tablet TAKE 1/2 TO 1 TABLET BY MOUTH THREE TIMES A DAY AS NEEDED FOR ANXIETY  . aspirin 81 MG EC tablet Take 81 mg by mouth daily.    . cetirizine (ZYRTEC) 10 MG tablet Take  10 mg by mouth as needed.    . Cholecalciferol (VITAMIN D3) 2000 UNITS capsule Take 2,000 Units by mouth daily.    . citalopram (CELEXA) 40 MG tablet TAKE 1 TABLET BY MOUTH EVERY DAY  . clotrimazole-betamethasone (LOTRISONE) cream APPLY TO AFFECTED AREA 2 TIMES DAILY   . conjugated estrogens (PREMARIN) vaginal cream Place 1 Applicatorful vaginally. Use 3 days a week.  . diclofenac (VOLTAREN) 75 MG EC tablet TAKE 1 TABLET (75 MG TOTAL) BY MOUTH 2 (TWO) TIMES DAILY.  Marland Kitchen Flaxseed, Linseed, (FLAX SEED OIL) 1000 MG CAPS Take 1,000 mg by mouth daily.  . hydrochlorothiazide (HYDRODIURIL) 25 MG tablet TAKE 1 TABLET BY MOUTH EVERY DAY  . losartan (COZAAR) 100 MG tablet Take 1 tablet daily for BP  . Magnesium 400 MG CAPS Take 400 mg by mouth daily.  . montelukast (SINGULAIR) 10 MG tablet TAKE ONE TABLET BY MOUTH ONE TIME DAILY  . Multiple Vitamin (MULTIVITAMIN) tablet Take 1 tablet by mouth daily.    . NON FORMULARY Calcium 1800 mg 1 tablet daily   . OVER THE COUNTER MEDICATION Otc Mucinex 1 tab daily  . predniSONE (DELTASONE) 20 MG tablet 1 tab 3 x day for 2 days, then 1 tab 2 x day for 2 days, then 1 tab 1 x day for 3 days  . Probiotic Product (PROBIOTIC-10) CAPS Take 1 tablet by mouth daily.  . promethazine-dextromethorphan (PROMETHAZINE-DM) 6.25-15 MG/5ML syrup Take 5 mLs by mouth 4 (four) times daily as needed for cough.  . simvastatin (ZOCOR) 40 MG tablet TAKE 1 TABLET (40 MG TOTAL) BY MOUTH AT BEDTIME.  Marland Kitchen tiZANidine (ZANAFLEX) 4 MG tablet Take 0.5-1 tablets (2-4 mg total) by mouth every 8 (eight) hours as needed for muscle spasms.  . traZODone (DESYREL) 150 MG tablet TAKE ONE-HALF TO ONE TABLET BY MOUTH ONE HOUR PRIOR TO BEDTIME  . vitamin C (ASCORBIC ACID) 500 MG tablet Take 500 mg by mouth daily.  . clobetasol cream (TEMOVATE) 7.32 % Apply 1 application topically 2 (two) times daily.   No current facility-administered medications on file prior to visit.      Allergies:  Allergies  Allergen Reactions  . Ace Inhibitors     Cough  . Ampicillin     rash  . Mevacor [Lovastatin]     Elevated LFT's  . Penicillins     REACTION: hives and  itching  . Sulfa Antibiotics     rash     Medical History:  Past Medical History:  Diagnosis Date  . Anxiety    . Dyslipidemia    on Rx x20 years  . HTN (hypertension)   . Hyperlipidemia   . Insomnia   . Morton's neuroma of left foot   . Multiple allergies   . Osteoarthritis   . Polycythemia   . PVC (premature ventricular contraction)    Family history- Reviewed and unchanged Social history- Reviewed and unchanged   Review of Systems:  ROS    Physical Exam: BP 126/76   Pulse 63   Temp 97.7 F (36.5 C)   Ht 5' 5.5" (1.664 m)   Wt 173 lb (78.5 kg)   SpO2 97%   BMI 28.35 kg/m  Wt Readings from Last 3 Encounters:  01/13/17 173 lb (78.5 kg)  09/29/16 177 lb (80.3 kg)  06/28/16 177 lb 9.6 oz (80.6 kg)   General Appearance: Well nourished, in no apparent distress. Eyes: PERRLA, EOMs, conjunctiva no swelling or erythema Sinuses: No Frontal/maxillary tenderness ENT/Mouth: Ext aud canals  clear, TMs without erythema, bulging. No erythema, swelling, or exudate on post pharynx.  Tonsils not swollen or erythematous. Hearing normal.  Neck: Supple, thyroid normal.  Respiratory: Respiratory effort normal, BS equal bilaterally without rales, rhonchi, wheezing or stridor.  Cardio: RRR with no MRGs. Brisk peripheral pulses without edema.  Abdomen: Soft, + BS.  Non tender, no guarding, rebound, hernias, masses. Lymphatics: Non tender without lymphadenopathy.  Musculoskeletal: Full ROM, 5/5 strength, Normal gait Skin: Warm, dry without rashes, lesions, ecchymosis.  Neuro: Cranial nerves intact. No cerebellar symptoms.  Psych: Awake and oriented X 3, normal affect, Insight and Judgment appropriate.    Izora Ribas, NP 2:53 PM [facility]

## 2017-01-13 ENCOUNTER — Ambulatory Visit: Payer: Medicare Other | Admitting: Adult Health

## 2017-01-13 ENCOUNTER — Encounter: Payer: Self-pay | Admitting: Adult Health

## 2017-01-13 VITALS — BP 126/76 | HR 63 | Temp 97.7°F | Ht 65.5 in | Wt 173.0 lb

## 2017-01-13 DIAGNOSIS — I10 Essential (primary) hypertension: Secondary | ICD-10-CM | POA: Diagnosis not present

## 2017-01-13 DIAGNOSIS — E782 Mixed hyperlipidemia: Secondary | ICD-10-CM | POA: Diagnosis not present

## 2017-01-13 DIAGNOSIS — Z79899 Other long term (current) drug therapy: Secondary | ICD-10-CM | POA: Diagnosis not present

## 2017-01-13 DIAGNOSIS — E559 Vitamin D deficiency, unspecified: Secondary | ICD-10-CM | POA: Diagnosis not present

## 2017-01-13 DIAGNOSIS — R7309 Other abnormal glucose: Secondary | ICD-10-CM | POA: Diagnosis not present

## 2017-01-13 DIAGNOSIS — F325 Major depressive disorder, single episode, in full remission: Secondary | ICD-10-CM

## 2017-01-13 DIAGNOSIS — J449 Chronic obstructive pulmonary disease, unspecified: Secondary | ICD-10-CM

## 2017-01-13 NOTE — Patient Instructions (Signed)

## 2017-01-14 ENCOUNTER — Encounter: Payer: Self-pay | Admitting: Adult Health

## 2017-01-14 ENCOUNTER — Other Ambulatory Visit: Payer: Self-pay | Admitting: Adult Health

## 2017-01-14 DIAGNOSIS — R748 Abnormal levels of other serum enzymes: Secondary | ICD-10-CM

## 2017-01-14 DIAGNOSIS — N182 Chronic kidney disease, stage 2 (mild): Secondary | ICD-10-CM | POA: Insufficient documentation

## 2017-01-14 DIAGNOSIS — N183 Chronic kidney disease, stage 3 unspecified: Secondary | ICD-10-CM

## 2017-01-14 LAB — LIPID PANEL
Cholesterol: 185 mg/dL (ref <200)
HDL: 71 mg/dL (ref >50)
LDL Cholesterol (Calc): 93 mg/dL (calc)
Non-HDL Cholesterol (Calc): 114 mg/dL (calc) (ref <130)
Total CHOL/HDL Ratio: 2.6 (calc) (ref <5.0)
Triglycerides: 113 mg/dL (ref <150)

## 2017-01-14 LAB — HEPATIC FUNCTION PANEL
AG Ratio: 1.8 (calc) (ref 1.0–2.5)
ALT: 102 U/L — ABNORMAL HIGH (ref 6–29)
AST: 35 U/L (ref 10–35)
Albumin: 3.8 g/dL (ref 3.6–5.1)
Alkaline phosphatase (APISO): 107 U/L (ref 33–130)
Bilirubin, Direct: 0.1 mg/dL (ref 0.0–0.2)
Globulin: 2.1 g/dL (calc) (ref 1.9–3.7)
Indirect Bilirubin: 0.6 mg/dL (calc) (ref 0.2–1.2)
Total Bilirubin: 0.7 mg/dL (ref 0.2–1.2)
Total Protein: 5.9 g/dL — ABNORMAL LOW (ref 6.1–8.1)

## 2017-01-14 LAB — CBC WITH DIFFERENTIAL/PLATELET
Basophils Absolute: 52 cells/uL (ref 0–200)
Basophils Relative: 0.6 %
Eosinophils Absolute: 292 cells/uL (ref 15–500)
Eosinophils Relative: 3.4 %
HCT: 38.5 % (ref 35.0–45.0)
Hemoglobin: 13.1 g/dL (ref 11.7–15.5)
Lymphs Abs: 1582 cells/uL (ref 850–3900)
MCH: 32 pg (ref 27.0–33.0)
MCHC: 34 g/dL (ref 32.0–36.0)
MCV: 94.1 fL (ref 80.0–100.0)
MPV: 9.6 fL (ref 7.5–12.5)
Monocytes Relative: 7.9 %
Neutro Abs: 5994 cells/uL (ref 1500–7800)
Neutrophils Relative %: 69.7 %
Platelets: 260 10*3/uL (ref 140–400)
RBC: 4.09 10*6/uL (ref 3.80–5.10)
RDW: 12.3 % (ref 11.0–15.0)
Total Lymphocyte: 18.4 %
WBC mixed population: 679 cells/uL (ref 200–950)
WBC: 8.6 10*3/uL (ref 3.8–10.8)

## 2017-01-14 LAB — TSH: TSH: 2.32 mIU/L (ref 0.40–4.50)

## 2017-01-14 LAB — BASIC METABOLIC PANEL WITH GFR
BUN/Creatinine Ratio: 20 (calc) (ref 6–22)
BUN: 19 mg/dL (ref 7–25)
CO2: 29 mmol/L (ref 20–32)
Calcium: 9.5 mg/dL (ref 8.6–10.4)
Chloride: 97 mmol/L — ABNORMAL LOW (ref 98–110)
Creat: 0.96 mg/dL — ABNORMAL HIGH (ref 0.60–0.93)
GFR, Est African American: 67 mL/min/{1.73_m2} (ref >=60)
GFR, Est Non African American: 57 mL/min/{1.73_m2} — ABNORMAL LOW (ref >=60)
Glucose, Bld: 93 mg/dL (ref 65–99)
Potassium: 3.9 mmol/L (ref 3.5–5.3)
Sodium: 135 mmol/L (ref 135–146)

## 2017-02-02 ENCOUNTER — Other Ambulatory Visit: Payer: Self-pay | Admitting: Internal Medicine

## 2017-02-08 HISTORY — PX: CATARACT EXTRACTION: SUR2

## 2017-02-15 ENCOUNTER — Other Ambulatory Visit: Payer: Self-pay | Admitting: Internal Medicine

## 2017-02-15 DIAGNOSIS — R300 Dysuria: Secondary | ICD-10-CM

## 2017-02-16 DIAGNOSIS — L570 Actinic keratosis: Secondary | ICD-10-CM | POA: Diagnosis not present

## 2017-02-16 DIAGNOSIS — D2261 Melanocytic nevi of right upper limb, including shoulder: Secondary | ICD-10-CM | POA: Diagnosis not present

## 2017-02-16 DIAGNOSIS — D225 Melanocytic nevi of trunk: Secondary | ICD-10-CM | POA: Diagnosis not present

## 2017-02-16 DIAGNOSIS — D045 Carcinoma in situ of skin of trunk: Secondary | ICD-10-CM | POA: Diagnosis not present

## 2017-02-16 DIAGNOSIS — L814 Other melanin hyperpigmentation: Secondary | ICD-10-CM | POA: Diagnosis not present

## 2017-02-16 DIAGNOSIS — L821 Other seborrheic keratosis: Secondary | ICD-10-CM | POA: Diagnosis not present

## 2017-02-17 ENCOUNTER — Ambulatory Visit (INDEPENDENT_AMBULATORY_CARE_PROVIDER_SITE_OTHER): Payer: Medicare Other

## 2017-02-17 DIAGNOSIS — N183 Chronic kidney disease, stage 3 unspecified: Secondary | ICD-10-CM

## 2017-02-17 DIAGNOSIS — R748 Abnormal levels of other serum enzymes: Secondary | ICD-10-CM

## 2017-02-17 DIAGNOSIS — R300 Dysuria: Secondary | ICD-10-CM

## 2017-02-17 LAB — BASIC METABOLIC PANEL WITH GFR
BUN: 15 mg/dL (ref 7–25)
CO2: 30 mmol/L (ref 20–32)
Calcium: 9.4 mg/dL (ref 8.6–10.4)
Chloride: 102 mmol/L (ref 98–110)
Creat: 0.85 mg/dL (ref 0.60–0.93)
GFR, Est African American: 77 mL/min/{1.73_m2} (ref >=60)
GFR, Est Non African American: 67 mL/min/{1.73_m2} (ref >=60)
Glucose, Bld: 89 mg/dL (ref 65–99)
Potassium: 4.4 mmol/L (ref 3.5–5.3)
Sodium: 139 mmol/L (ref 135–146)

## 2017-02-17 LAB — HEPATIC FUNCTION PANEL
AG Ratio: 2.2 (calc) (ref 1.0–2.5)
ALT: 36 U/L — ABNORMAL HIGH (ref 6–29)
AST: 24 U/L (ref 10–35)
Albumin: 4.1 g/dL (ref 3.6–5.1)
Alkaline phosphatase (APISO): 83 U/L (ref 33–130)
Bilirubin, Direct: 0.1 mg/dL (ref 0.0–0.2)
Globulin: 1.9 g/dL (calc) (ref 1.9–3.7)
Indirect Bilirubin: 0.5 mg/dL (calc) (ref 0.2–1.2)
Total Bilirubin: 0.6 mg/dL (ref 0.2–1.2)
Total Protein: 6 g/dL — ABNORMAL LOW (ref 6.1–8.1)

## 2017-02-17 NOTE — Progress Notes (Signed)
Patient presents to the office for labs only. Patient complaining of lower back pain and feels that she might have a urinary tract infection. Sample collected in office.

## 2017-02-18 ENCOUNTER — Other Ambulatory Visit: Payer: Self-pay | Admitting: Internal Medicine

## 2017-02-18 DIAGNOSIS — F419 Anxiety disorder, unspecified: Secondary | ICD-10-CM

## 2017-02-18 LAB — URINALYSIS, ROUTINE W REFLEX MICROSCOPIC
Bacteria, UA: NONE SEEN /HPF
Bilirubin Urine: NEGATIVE
Glucose, UA: NEGATIVE
Hgb urine dipstick: NEGATIVE
Hyaline Cast: NONE SEEN /LPF
Ketones, ur: NEGATIVE
Nitrite: NEGATIVE
Protein, ur: NEGATIVE
Specific Gravity, Urine: 1.012 (ref 1.001–1.03)
pH: 7 (ref 5.0–8.0)

## 2017-02-18 LAB — URINE CULTURE
MICRO NUMBER:: 90041472
Result:: NO GROWTH
SPECIMEN QUALITY:: ADEQUATE

## 2017-02-23 DIAGNOSIS — L570 Actinic keratosis: Secondary | ICD-10-CM | POA: Diagnosis not present

## 2017-02-23 DIAGNOSIS — D045 Carcinoma in situ of skin of trunk: Secondary | ICD-10-CM | POA: Diagnosis not present

## 2017-03-12 ENCOUNTER — Other Ambulatory Visit: Payer: Self-pay | Admitting: Internal Medicine

## 2017-03-22 ENCOUNTER — Other Ambulatory Visit: Payer: Self-pay | Admitting: Internal Medicine

## 2017-03-22 DIAGNOSIS — I10 Essential (primary) hypertension: Secondary | ICD-10-CM

## 2017-03-24 ENCOUNTER — Other Ambulatory Visit: Payer: Self-pay | Admitting: Internal Medicine

## 2017-03-24 DIAGNOSIS — I10 Essential (primary) hypertension: Secondary | ICD-10-CM

## 2017-04-01 ENCOUNTER — Other Ambulatory Visit: Payer: Self-pay | Admitting: Internal Medicine

## 2017-04-20 ENCOUNTER — Encounter: Payer: Self-pay | Admitting: Internal Medicine

## 2017-04-20 ENCOUNTER — Other Ambulatory Visit: Payer: Self-pay | Admitting: Internal Medicine

## 2017-04-20 ENCOUNTER — Ambulatory Visit: Payer: Medicare Other | Admitting: Internal Medicine

## 2017-04-20 VITALS — BP 132/76 | HR 60 | Temp 97.3°F | Resp 16 | Ht 65.5 in | Wt 170.4 lb

## 2017-04-20 DIAGNOSIS — R7309 Other abnormal glucose: Secondary | ICD-10-CM | POA: Diagnosis not present

## 2017-04-20 DIAGNOSIS — R7303 Prediabetes: Secondary | ICD-10-CM

## 2017-04-20 DIAGNOSIS — E559 Vitamin D deficiency, unspecified: Secondary | ICD-10-CM

## 2017-04-20 DIAGNOSIS — I10 Essential (primary) hypertension: Secondary | ICD-10-CM | POA: Diagnosis not present

## 2017-04-20 DIAGNOSIS — E782 Mixed hyperlipidemia: Secondary | ICD-10-CM

## 2017-04-20 DIAGNOSIS — Z79899 Other long term (current) drug therapy: Secondary | ICD-10-CM

## 2017-04-20 NOTE — Patient Instructions (Signed)

## 2017-04-20 NOTE — Progress Notes (Signed)
This very nice 77 y.o. WWF presents for 6 month follow up with HTN, HLD, Pre-Diabetes and Vitamin D Deficiency. Patient has hx/o Depression controlled on meds.      Patient is treated for HTN (1996)  & BP has been controlled at home. Today's BP is at goal - 132/76. Patient has had no complaints of any cardiac type chest pain, palpitations, dyspnea / orthopnea / PND, dizziness, claudication, or dependent edema.     Hyperlipidemia is controlled with diet & meds. Patient denies myalgias or other med SE's. Last Lipids were at goal: Lab Results  Component Value Date   CHOL 185 01/13/2017   HDL 71 01/13/2017   LDLCALC 93 01/13/2017   TRIG 113 01/13/2017   CHOLHDL 2.6 01/13/2017      Also, the patient has history of PreDiabetes and has had no symptoms of reactive hypoglycemia, diabetic polys, paresthesias or visual blurring.  Last A1c was Normal & at goal: Lab Results  Component Value Date   HGBA1C 5.3 09/29/2016      Further, the patient also has history of Vitamin D Deficiency and supplements vitamin D without any suspected side-effects. Last vitamin D was at goal:  Lab Results  Component Value Date   VD25OH 63 09/29/2016   Current Outpatient Medications on File Prior to Visit  Medication Sig  . ALPRAZolam (XANAX) 0.5 MG tablet Take 1/2 to 1 tablet 2 to 3 x / day ONLY if Severe Anxiety Attack and please try to limit to 5 days /week to avoid addiction  . aspirin 81 MG EC tablet Take 81 mg by mouth daily.    . cetirizine (ZYRTEC) 10 MG tablet Take 10 mg by mouth as needed.    . Cholecalciferol (VITAMIN D3) 2000 UNITS capsule Take 2,000 Units by mouth daily.    . citalopram (CELEXA) 40 MG tablet TAKE 1 TABLET BY MOUTH EVERY DAY  . clotrimazole-betamethasone (LOTRISONE) cream APPLY TO AFFECTED AREA 2 TIMES DAILY  . conjugated estrogens (PREMARIN) vaginal cream Place 1 Applicatorful vaginally. Use 3 days a week.  . diclofenac (VOLTAREN) 75 MG EC tablet TAKE 1 TABLET (75 MG TOTAL) BY  MOUTH 2 (TWO) TIMES DAILY.  Marland Kitchen Flaxseed, Linseed, (FLAX SEED OIL) 1000 MG CAPS Take 1,000 mg by mouth daily.  . hydrochlorothiazide (HYDRODIURIL) 25 MG tablet TAKE 1 TABLET BY MOUTH EVERY DAY  . losartan (COZAAR) 100 MG tablet TAKE 1 TABLET DAILY FOR FOR BLOOD PRESSURE  . Magnesium 400 MG CAPS Take 400 mg by mouth daily.  . Multiple Vitamin (MULTIVITAMIN) tablet Take 1 tablet by mouth daily.    . NON FORMULARY Calcium 1800 mg 1 tablet daily   . OVER THE COUNTER MEDICATION Otc Mucinex 1 tab daily  . Probiotic Product (PROBIOTIC-10) CAPS Take 1 tablet by mouth daily.  . simvastatin (ZOCOR) 40 MG tablet TAKE 1 TABLET (40 MG TOTAL) BY MOUTH AT BEDTIME.  Marland Kitchen tiZANidine (ZANAFLEX) 4 MG tablet Take 0.5-1 tablets (2-4 mg total) by mouth every 8 (eight) hours as needed for muscle spasms.  . traZODone (DESYREL) 150 MG tablet TAKE 1/2 TO 1 TABLET BY MOUTH ONE HOUR PRIOR TO BEDTIME  . vitamin C (ASCORBIC ACID) 500 MG tablet Take 500 mg by mouth daily.   No current facility-administered medications on file prior to visit.    Allergies  Allergen Reactions  . Ace Inhibitors     Cough  . Ampicillin     rash  . Mevacor [Lovastatin]  Elevated LFT's  . Penicillins     REACTION: hives and  itching  . Sulfa Antibiotics     rash   PMHx:   Past Medical History:  Diagnosis Date  . Anxiety   . Dyslipidemia    on Rx x20 years  . HTN (hypertension)   . Hyperlipidemia   . Insomnia   . Morton's neuroma of left foot   . Multiple allergies   . Osteoarthritis   . Polycythemia   . PVC (premature ventricular contraction)    Immunization History  Administered Date(s) Administered  . Influenza Split 11/15/2011, 11/28/2012, 10/31/2013, 01/07/2014  . Influenza, High Dose Seasonal PF 11/27/2014, 11/28/2015  . Influenza-Unspecified 09/29/2016  . Pneumococcal Conjugate-13 10/02/2013  . Tdap 08/01/2013  . Zoster 05/25/2011  . Zoster Recombinat (Shingrix) 11/10/2016   Past Surgical History:  Procedure  Laterality Date  . ABDOMINAL HYSTERECTOMY    . KNEE ARTHROSCOPY     R  . TONSILLECTOMY    . VESICOVAGINAL FISTULA CLOSURE W/ TAH     FHx:    Reviewed / unchanged  SHx:    Reviewed / unchanged   Systems Review:  Constitutional: Denies fever, chills, wt changes, headaches, insomnia, fatigue, night sweats, change in appetite. Eyes: Denies redness, blurred vision, diplopia, discharge, itchy, watery eyes.  ENT: Denies discharge, congestion, post nasal drip, epistaxis, sore throat, earache, hearing loss, dental pain, tinnitus, vertigo, sinus pain, snoring.  CV: Denies chest pain, palpitations, irregular heartbeat, syncope, dyspnea, diaphoresis, orthopnea, PND, claudication or edema. Respiratory: denies cough, dyspnea, DOE, pleurisy, hoarseness, laryngitis, wheezing.  Gastrointestinal: Denies dysphagia, odynophagia, heartburn, reflux, water brash, abdominal pain or cramps, nausea, vomiting, bloating, diarrhea, constipation, hematemesis, melena, hematochezia  or hemorrhoids. Genitourinary: Denies dysuria, frequency, urgency, nocturia, hesitancy, discharge, hematuria or flank pain. Musculoskeletal: Denies arthralgias, myalgias, stiffness, jt. swelling, pain, limping or strain/sprain.  Skin: Denies pruritus, rash, hives, warts, acne, eczema or change in skin lesion(s). Neuro: No weakness, tremor, incoordination, spasms, paresthesia or pain. Psychiatric: Denies confusion, memory loss or sensory loss. Endo: Denies change in weight, skin or hair change.  Heme/Lymph: No excessive bleeding, bruising or enlarged lymph nodes.  Physical Exam  BP 132/76   Pulse 60   Temp (!) 97.3 F (36.3 C)   Resp 16   Ht 5' 5.5" (1.664 m)   Wt 170 lb 6.4 oz (77.3 kg)   BMI 27.92 kg/m   Appears  well nourished, well groomed  and in no distress.  Eyes: PERRLA, EOMs, conjunctiva no swelling or erythema. Sinuses: No frontal/maxillary tenderness ENT/Mouth: EAC's clear, TM's nl w/o erythema, bulging. Nares clear  w/o erythema, swelling, exudates. Oropharynx clear without erythema or exudates. Oral hygiene is good. Tongue normal, non obstructing. Hearing intact.  Neck: Supple. Thyroid not palpable. Car 2+/2+ without bruits, nodes or JVD. Chest: Respirations nl with BS clear & equal w/o rales, rhonchi, wheezing or stridor.  Cor: Heart sounds normal w/ regular rate and rhythm without sig. murmurs, gallops, clicks or rubs. Peripheral pulses normal and equal  without edema.  Abdomen: Soft & bowel sounds normal. Non-tender w/o guarding, rebound, hernias, masses or organomegaly.  Lymphatics: Unremarkable.  Musculoskeletal: Full ROM all peripheral extremities, joint stability, 5/5 strength and normal gait.  Skin: Warm, dry without exposed rashes, lesions or ecchymosis apparent.  Neuro: Cranial nerves intact, reflexes equal bilaterally. Sensory-motor testing grossly intact. Tendon reflexes grossly intact.  Pysch: Alert & oriented x 3.  Insight and judgement nl & appropriate. No ideations.  Assessment and Plan:  1. Essential hypertension  -  Continue medication, monitor blood pressure at home.  - Continue DASH diet. Reminder to go to the ER if any CP,  SOB, nausea, dizziness, severe HA, changes vision/speech.  - CBC with Differential/Platelet - BASIC METABOLIC PANEL WITH GFR - Magnesium - TSH  2. Hyperlipidemia, mixed  - Continue diet/meds, exercise,& lifestyle modifications.  - Continue monitor periodic cholesterol/liver & renal functions   - Hepatic function panel - Lipid panel - TSH  3. Abnormal glucose  - Continue diet, exercise, lifestyle modifications.  - Monitor appropriate labs.  - Hemoglobin A1c - Insulin, random  4. Vitamin D deficiency  - Continue supplementation.  - VITAMIN D 25 Hydroxyl  5. Prediabetes  - Hemoglobin A1c - Insulin, random  6. Medication management  - CBC with Differential/Platelet - BASIC METABOLIC PANEL WITH GFR - Hepatic function panel -  Magnesium - Lipid panel - TSH - Hemoglobin A1c - Insulin, random - VITAMIN D 25 Hydroxyl         Discussed  regular exercise, BP monitoring, weight control to achieve/maintain BMI less than 25 and discussed med and SE's. Recommended labs to assess and monitor clinical status with further disposition pending results of labs. Over 30 minutes of exam, counseling, chart review was performed.

## 2017-04-21 ENCOUNTER — Other Ambulatory Visit: Payer: Self-pay | Admitting: Internal Medicine

## 2017-04-21 DIAGNOSIS — D649 Anemia, unspecified: Secondary | ICD-10-CM

## 2017-04-21 LAB — CBC WITH DIFFERENTIAL/PLATELET
Basophils Absolute: 51 cells/uL (ref 0–200)
Basophils Relative: 0.9 %
Eosinophils Absolute: 302 cells/uL (ref 15–500)
Eosinophils Relative: 5.3 %
HCT: 32.8 % — ABNORMAL LOW (ref 35.0–45.0)
Hemoglobin: 11.2 g/dL — ABNORMAL LOW (ref 11.7–15.5)
Lymphs Abs: 1505 cells/uL (ref 850–3900)
MCH: 32.6 pg (ref 27.0–33.0)
MCHC: 34.1 g/dL (ref 32.0–36.0)
MCV: 95.3 fL (ref 80.0–100.0)
MPV: 9.9 fL (ref 7.5–12.5)
Monocytes Relative: 7.9 %
Neutro Abs: 3392 cells/uL (ref 1500–7800)
Neutrophils Relative %: 59.5 %
Platelets: 239 10*3/uL (ref 140–400)
RBC: 3.44 10*6/uL — ABNORMAL LOW (ref 3.80–5.10)
RDW: 11.9 % (ref 11.0–15.0)
Total Lymphocyte: 26.4 %
WBC mixed population: 450 cells/uL (ref 200–950)
WBC: 5.7 10*3/uL (ref 3.8–10.8)

## 2017-04-21 LAB — BASIC METABOLIC PANEL WITH GFR
BUN/Creatinine Ratio: 15 (calc) (ref 6–22)
BUN: 15 mg/dL (ref 7–25)
CO2: 30 mmol/L (ref 20–32)
Calcium: 9.8 mg/dL (ref 8.6–10.4)
Chloride: 98 mmol/L (ref 98–110)
Creat: 0.99 mg/dL — ABNORMAL HIGH (ref 0.60–0.93)
GFR, Est African American: 64 mL/min/{1.73_m2} (ref >=60)
GFR, Est Non African American: 55 mL/min/{1.73_m2} — ABNORMAL LOW (ref >=60)
Glucose, Bld: 99 mg/dL (ref 65–99)
Potassium: 4.4 mmol/L (ref 3.5–5.3)
Sodium: 137 mmol/L (ref 135–146)

## 2017-04-21 LAB — HEPATIC FUNCTION PANEL
AG Ratio: 2.2 (calc) (ref 1.0–2.5)
ALT: 30 U/L — ABNORMAL HIGH (ref 6–29)
AST: 23 U/L (ref 10–35)
Albumin: 4 g/dL (ref 3.6–5.1)
Alkaline phosphatase (APISO): 72 U/L (ref 33–130)
Bilirubin, Direct: 0.1 mg/dL (ref 0.0–0.2)
Globulin: 1.8 g/dL (calc) — ABNORMAL LOW (ref 1.9–3.7)
Indirect Bilirubin: 0.6 mg/dL (calc) (ref 0.2–1.2)
Total Bilirubin: 0.7 mg/dL (ref 0.2–1.2)
Total Protein: 5.8 g/dL — ABNORMAL LOW (ref 6.1–8.1)

## 2017-04-21 LAB — HEMOGLOBIN A1C
Hgb A1c MFr Bld: 5 % of total Hgb (ref <5.7)
Mean Plasma Glucose: 97 (calc)
eAG (mmol/L): 5.4 (calc)

## 2017-04-21 LAB — VITAMIN D 25 HYDROXY (VIT D DEFICIENCY, FRACTURES): Vit D, 25-Hydroxy: 60 ng/mL (ref 30–100)

## 2017-04-21 LAB — MAGNESIUM: Magnesium: 1.9 mg/dL (ref 1.5–2.5)

## 2017-04-21 LAB — LIPID PANEL
Cholesterol: 174 mg/dL (ref <200)
HDL: 49 mg/dL — ABNORMAL LOW (ref >50)
LDL Cholesterol (Calc): 102 mg/dL (calc) — ABNORMAL HIGH
Non-HDL Cholesterol (Calc): 125 mg/dL (calc) (ref <130)
Total CHOL/HDL Ratio: 3.6 (calc) (ref <5.0)
Triglycerides: 131 mg/dL (ref <150)

## 2017-04-21 LAB — INSULIN, RANDOM: Insulin: 6.1 u[IU]/mL (ref 2.0–19.6)

## 2017-04-21 LAB — TSH: TSH: 4.25 mIU/L (ref 0.40–4.50)

## 2017-05-20 DIAGNOSIS — H2513 Age-related nuclear cataract, bilateral: Secondary | ICD-10-CM | POA: Diagnosis not present

## 2017-05-20 DIAGNOSIS — H524 Presbyopia: Secondary | ICD-10-CM | POA: Diagnosis not present

## 2017-05-20 DIAGNOSIS — H52203 Unspecified astigmatism, bilateral: Secondary | ICD-10-CM | POA: Diagnosis not present

## 2017-05-25 ENCOUNTER — Other Ambulatory Visit: Payer: Self-pay | Admitting: Internal Medicine

## 2017-05-25 ENCOUNTER — Ambulatory Visit (INDEPENDENT_AMBULATORY_CARE_PROVIDER_SITE_OTHER): Payer: Medicare Other

## 2017-05-25 DIAGNOSIS — D519 Vitamin B12 deficiency anemia, unspecified: Secondary | ICD-10-CM

## 2017-05-25 DIAGNOSIS — D649 Anemia, unspecified: Secondary | ICD-10-CM

## 2017-05-25 NOTE — Progress Notes (Signed)
Patient presents to the office for a nurse visit to have labs drawn. Patient states that there aren't any changes with her medications and has no concerns.

## 2017-05-26 LAB — CBC WITH DIFFERENTIAL/PLATELET
Basophils Absolute: 58 cells/uL (ref 0–200)
Basophils Relative: 0.8 %
Eosinophils Absolute: 353 cells/uL (ref 15–500)
Eosinophils Relative: 4.9 %
HCT: 36.1 % (ref 35.0–45.0)
Hemoglobin: 12.5 g/dL (ref 11.7–15.5)
Lymphs Abs: 1462 cells/uL (ref 850–3900)
MCH: 32.1 pg (ref 27.0–33.0)
MCHC: 34.6 g/dL (ref 32.0–36.0)
MCV: 92.8 fL (ref 80.0–100.0)
MPV: 10.1 fL (ref 7.5–12.5)
Monocytes Relative: 8.6 %
Neutro Abs: 4709 cells/uL (ref 1500–7800)
Neutrophils Relative %: 65.4 %
Platelets: 238 10*3/uL (ref 140–400)
RBC: 3.89 10*6/uL (ref 3.80–5.10)
RDW: 11.6 % (ref 11.0–15.0)
Total Lymphocyte: 20.3 %
WBC mixed population: 619 cells/uL (ref 200–950)
WBC: 7.2 10*3/uL (ref 3.8–10.8)

## 2017-05-26 LAB — RETICULOCYTES
ABS Retic: 38300 cells/uL (ref 20000–8000)
Retic Ct Pct: 1 %

## 2017-05-26 LAB — FERRITIN: Ferritin: 41 ng/mL (ref 20–288)

## 2017-05-26 LAB — FOLATE RBC: RBC Folate: 953 ng/mL RBC (ref >280)

## 2017-05-26 LAB — VITAMIN B12: Vitamin B-12: 515 pg/mL (ref 200–1100)

## 2017-05-26 LAB — IRON, TOTAL/TOTAL IRON BINDING CAP
%SAT: 21 % (calc) (ref 11–50)
Iron: 68 ug/dL (ref 45–160)
TIBC: 330 mcg/dL (calc) (ref 250–450)

## 2017-05-28 ENCOUNTER — Other Ambulatory Visit (INDEPENDENT_AMBULATORY_CARE_PROVIDER_SITE_OTHER): Payer: Self-pay | Admitting: Orthopaedic Surgery

## 2017-06-28 ENCOUNTER — Other Ambulatory Visit: Payer: Self-pay | Admitting: Internal Medicine

## 2017-07-06 ENCOUNTER — Other Ambulatory Visit: Payer: Self-pay | Admitting: Internal Medicine

## 2017-07-06 DIAGNOSIS — F419 Anxiety disorder, unspecified: Secondary | ICD-10-CM

## 2017-07-21 DIAGNOSIS — H25811 Combined forms of age-related cataract, right eye: Secondary | ICD-10-CM | POA: Diagnosis not present

## 2017-07-21 DIAGNOSIS — H2511 Age-related nuclear cataract, right eye: Secondary | ICD-10-CM | POA: Diagnosis not present

## 2017-07-25 ENCOUNTER — Ambulatory Visit: Payer: Self-pay | Admitting: Adult Health

## 2017-08-02 NOTE — Progress Notes (Signed)
Patient ID: Kelsey Lopez, female   DOB: 1940/06/28, 77 y.o.   MRN: [id number] MEDICARE ANNUAL WELLNESS VISIT AND 3 month  Assessment:    Encounter for annual medicare wellness visit Pneumonia vaccine administered, defer dexa per patient request Repeat in 1 year  Essential hypertension - continue medications, DASH diet, exercise and monitor at home. Call if greater than 130/80.  - CMP/GFR - TSH   Other abnormal glucose Discussed general issues about diabetes pathophysiology and management., Educational material distributed., Suggested low cholesterol diet., Encouraged aerobic exercise., Discussed foot care., Reminded to get yearly retinal exam.  Mixed hyperlipidemia -continue medications, check lipids, decrease fatty foods, increase activity.  - Lipid panel   Depression/ Anxiety Well managed by current regimen; continue medications Stress management techniques discussed, increase water, good sleep hygiene discussed, increase exercise, and increase veggies.   Medication management - Magnesium   Vitamin D deficiency - Vit D  25 hydroxy (rtn osteoporosis monitoring)  Osteoarthritis, unspecified osteoarthritis type, unspecified site RICE, NSAIDS, followed by Dr. Erlinda Hong  OVERWEIGHT Obesity with co morbidities- long discussion about weight loss, diet, and exercise  COPD (chronic obstructive pulmonary disease) with chronic bronchitis  continue meds.    Insomnia Insomnia- good sleep hygiene discussed, increase day time activity, try melatonin or benadryl if this does not help we will call in sleep medication.  - continue trazodone  Stage 3 CKD Increase fluids, avoid NSAIDS, monitor sugars, will monitor    Future Appointments  Date Time Provider [place]  11/08/2017 10:00 AM Unk Pinto, MD [facility] None      Plan:   During the course of the visit the patient was educated and counseled about appropriate screening and preventive services including:     Pneumococcal vaccine   Influenza vaccine  Td vaccine  Screening electrocardiogram  Screening mammography  Bone densitometry screening  Colorectal cancer screening  Diabetes screening  Glaucoma screening  Nutrition counseling   Advanced directives: given information/requested   Subjective:   Kelsey Lopez is a 77 y.o. female who presents for Medicare Annual Wellness Visit and 3 month follow up for HTN, chol, and preDM.   She has COPD due to being former smoker.  She is on celexa for anxiety, started after her husband passed 8 years ago and think it is still helping, she is on xanax PRN (takes 1/2 tab once daily) and she is on trazodone.  Following with Dr. Erlinda Hong for knee pain, bone on bone, doesn't want surgery, PT helped and doing well with occasional injections.   BMI is Body mass index is 27.37 kg/m., she has been working on diet and exercise. Wt Readings from Last 3 Encounters:  08/03/17 167 lb (75.8 kg)  04/20/17 170 lb 6.4 oz (77.3 kg)  01/13/17 173 lb (78.5 kg)   Her blood pressure has been controlled at home, today their BP is BP: 120/64 She does workout. She denies chest pain, shortness of breath, dizziness.   She is on cholesterol medication and denies myalgias. Her cholesterol is not at goal. The cholesterol last visit was:   Lab Results  Component Value Date   CHOL 174 04/20/2017   HDL 49 (L) 04/20/2017   LDLCALC 102 (H) 04/20/2017   TRIG 131 04/20/2017   CHOLHDL 3.6 04/20/2017   Last A1C in the office was:  Lab Results  Component Value Date   HGBA1C 5.0 04/20/2017   Patient is on Vitamin D supplement.   Lab Results  Component Value Date   VD25OH 31  04/20/2017         Medication Review Current Outpatient Medications on File Prior to Visit  Medication Sig Dispense Refill  . ALPRAZolam (XANAX) 0.5 MG tablet TAKE 1/2 TO 1 TAB 2 TO 3 X / DAY ONLY IF SEVERE ANXIETY & LIMIT TO 5 DAYS /WEEK TO AVOID ADDICTION 90 tablet 0  . aspirin 81 MG EC  tablet Take 81 mg by mouth daily.      . cetirizine (ZYRTEC) 10 MG tablet Take 10 mg by mouth as needed.      . Cholecalciferol (VITAMIN D3) 2000 UNITS capsule Take 2,000 Units by mouth daily.      . citalopram (CELEXA) 40 MG tablet TAKE 1 TABLET BY MOUTH EVERY DAY 90 tablet 1  . clotrimazole-betamethasone (LOTRISONE) cream APPLY TO AFFECTED AREA 2 TIMES DAILY 45 g 11  . conjugated estrogens (PREMARIN) vaginal cream Place 1 Applicatorful vaginally. Use once every other week    . diclofenac (VOLTAREN) 75 MG EC tablet TAKE 1 TABLET (75 MG TOTAL) BY MOUTH 2 (TWO) TIMES DAILY. 90 tablet 3  . Flaxseed, Linseed, (FLAX SEED OIL) 1000 MG CAPS Take 1,000 mg by mouth daily.    . hydrochlorothiazide (HYDRODIURIL) 25 MG tablet TAKE 1 TABLET BY MOUTH EVERY DAY 90 tablet 1  . losartan (COZAAR) 100 MG tablet TAKE 1 TABLET DAILY FOR FOR BLOOD PRESSURE 90 tablet 1  . Magnesium 400 MG CAPS Take 400 mg by mouth daily.    . montelukast (SINGULAIR) 10 MG tablet TAKE ONE TABLET BY MOUTH ONE TIME DAILY 90 tablet 1  . Multiple Vitamin (MULTIVITAMIN) tablet Take 1 tablet by mouth daily.      . NON FORMULARY Calcium 1800 mg 1 tablet daily     . OVER THE COUNTER MEDICATION Otc Mucinex 1 tab daily    . Probiotic Product (PROBIOTIC-10) CAPS Take 1 tablet by mouth daily.    . simvastatin (ZOCOR) 40 MG tablet TAKE 1 TABLET (40 MG TOTAL) BY MOUTH AT BEDTIME. 90 tablet 1  . tiZANidine (ZANAFLEX) 4 MG tablet Take 0.5-1 tablets (2-4 mg total) by mouth every 8 (eight) hours as needed for muscle spasms. 30 tablet 2  . traZODone (DESYREL) 150 MG tablet TAKE 1/2 TO 1 TABLET BY MOUTH ONE HOUR PRIOR TO BEDTIME 90 tablet 1  . vitamin C (ASCORBIC ACID) 500 MG tablet Take 500 mg by mouth daily.     No current facility-administered medications on file prior to visit.     Current Problems (verified) Patient Active Problem List   Diagnosis Date Noted  . Stage 3 chronic kidney disease ([place]) 01/14/2017  . Depression, major, in remission  ([place]) 11/27/2014  . Overweight (BMI 25.0-29.9) 04/10/2014  . COPD (chronic obstructive pulmonary disease) with chronic bronchitis ([place]) 04/10/2014  . Insomnia 04/10/2014  . Vitamin D deficiency 01/07/2014  . Abnormal blood sugar 01/03/2014  . Medication management 01/03/2014  . Anxiety   . Morton's neuroma of left foot   . Osteoarthritis   . Mixed hyperlipidemia 06/05/2009  . Essential hypertension 06/05/2009    Immunization History  Administered Date(s) Administered  . Influenza Split 11/15/2011, 11/28/2012, 10/31/2013, 01/07/2014  . Influenza, High Dose Seasonal PF 11/27/2014, 11/28/2015  . Influenza-Unspecified 09/29/2016  . Pneumococcal Conjugate-13 10/02/2013  . Pneumococcal Polysaccharide-23 08/03/2017  . Tdap 08/01/2013  . Zoster 05/25/2011  . Zoster Recombinat (Shingrix) 11/10/2016    Preventative care: Last colonoscopy: 05/2012 - deferring 5 year follow up at this time Last mammogram: 12/2016 Last pap smear/pelvic exam:  2007  DEXA:07/19/13 osteopenia - wants to postpone to later this year or next year ECHO: 07/14/09 CXR 08/2014  Prior vaccinations: TDAP: 2015  Influenza: 2018 Pneumococcal: 2004, 2019- given today Prevnar 13: 2015 Shingles/Zostavax: 2013, getting shingrix series 2019  Dr. Ellie Lunch, (Eye), last visit 2019 [place] Ganja, (Dentist), 2019, goes q2m  Patient Care Team: Unk Pinto, MD as PCP - General (Internal Medicine) Druscilla Brownie, MD as Consulting Physician (Dermatology) Clarene Essex, MD as Consulting Physician (Gastroenterology) Fay Records, MD as Consulting Physician (Cardiology) Leandrew Koyanagi, MD as Attending Physician (Orthopedic Surgery)   Allergies Allergies  Allergen Reactions  . Ace Inhibitors     Cough  . Ampicillin     rash  . Mevacor [Lovastatin]     Elevated LFT's  . Penicillins     REACTION: hives and  itching  . Sulfa Antibiotics     rash    SURGICAL HISTORY She  has a past surgical history that includes  Tonsillectomy; Vesicovaginal fistula closure w/ TAH; Knee arthroscopy; Abdominal hysterectomy; and Cataract extraction (Right, 2019). FAMILY HISTORY Her family history includes Cancer in her father; Diabetes in her brother; Heart attack in her mother; Heart disease in her mother; Hyperlipidemia in her brother; Osteoarthritis in her father and mother. SOCIAL HISTORY She  reports that she has never smoked. She has never used smokeless tobacco. She reports that she drinks alcohol.  Physical activity: Current Exercise Habits: Home exercise routine, Type of exercise: walking, Time (Minutes): 25, Frequency (Times/Week): 3, Weekly Exercise (Minutes/Week): 75, Intensity: Mild, Exercise limited by: orthopedic condition(s) Cardiac risk factors: Cardiac Risk Factors include: advanced age (>1men, >68 women);hypertension;dyslipidemia;smoking/ tobacco exposure(Second hand smoke) Depression/mood screen:   Depression screen [facility] 2/9 08/03/2017  Decreased Interest 0  Down, Depressed, Hopeless 0  PHQ - 2 Score 0    ADLs:  In your present state of health, do you have any difficulty performing the following activities: 08/03/2017 09/29/2016  Hearing? N N  Vision? N N  Difficulty concentrating or making decisions? N N  Walking or climbing stairs? N N  Dressing or bathing? N N  Doing errands, shopping? N N  Some recent data might be hidden     Cognitive Testing  Alert? Yes  Normal Appearance?Yes  Oriented to person? Yes  Place? Yes   Time? Yes  Recall of three objects?  Yes  Can perform simple calculations? Yes  Displays appropriate judgment?Yes  Can read the correct time from a watch face?Yes  EOL planning: Does Patient Have a Medical Advance Directive?: Yes Type of Advance Directive: Healthcare Power of Attorney, Living will Does patient want to make changes to medical advance directive?: No - Patient declined Copy of [place] in Chart?: No - copy requested   Objective:      Blood pressure 120/64, pulse 61, temperature (!) 97.3 F (36.3 C), height 5' 5.5" (1.664 m), weight 167 lb (75.8 kg), SpO2 97 %. Body mass index is 27.37 kg/m.  General appearance: alert, no distress, WD/WN,  female HEENT: normocephalic, sclerae anicteric, TMs pearly, nares patent, no discharge or erythema, pharynx normal Oral cavity: MMM, no lesions Neck: supple, no lymphadenopathy, no thyromegaly, no masses Heart: RRR, normal S1, S2, no murmurs Lungs: CTA bilaterally, no wheezes, rhonchi, or rales Abdomen: +bs, soft, non tender, non distended, no masses, no hepatomegaly, no splenomegaly Musculoskeletal: nontender, no swelling, no obvious deformity Extremities: no edema, no cyanosis, no clubbing Pulses: 2+ symmetric, upper and lower extremities, normal cap refill Neurological: alert, oriented x  3, CN2-12 intact, strength normal upper extremities and lower extremities, sensation normal throughout, DTRs 2+ throughout, no cerebellar signs, gait normal Skin: WNL Psychiatric: normal affect, behavior normal, pleasant  Breast:  defer Rectal: defer   Medicare Attestation I have personally reviewed: The patient's medical and social history Their use of alcohol, tobacco or illicit drugs Their current medications and supplements The patient's functional ability including ADLs,fall risks, home safety risks, cognitive, and hearing and visual impairment Diet and physical activities Evidence for depression or mood disorders  The patient's weight, height, BMI, and visual acuity have been recorded in the chart.  I have made referrals, counseling, and provided education to the patient based on review of the above and I have provided the patient with a written personalized care plan for preventive services.     Izora Ribas, NP   08/03/2017

## 2017-08-03 ENCOUNTER — Ambulatory Visit: Payer: Medicare Other | Admitting: Adult Health

## 2017-08-03 ENCOUNTER — Encounter: Payer: Self-pay | Admitting: Adult Health

## 2017-08-03 VITALS — BP 120/64 | HR 61 | Temp 97.3°F | Ht 65.5 in | Wt 167.0 lb

## 2017-08-03 DIAGNOSIS — Z0000 Encounter for general adult medical examination without abnormal findings: Secondary | ICD-10-CM

## 2017-08-03 DIAGNOSIS — I10 Essential (primary) hypertension: Secondary | ICD-10-CM

## 2017-08-03 DIAGNOSIS — F325 Major depressive disorder, single episode, in full remission: Secondary | ICD-10-CM

## 2017-08-03 DIAGNOSIS — F419 Anxiety disorder, unspecified: Secondary | ICD-10-CM | POA: Diagnosis not present

## 2017-08-03 DIAGNOSIS — E782 Mixed hyperlipidemia: Secondary | ICD-10-CM

## 2017-08-03 DIAGNOSIS — Z23 Encounter for immunization: Secondary | ICD-10-CM | POA: Diagnosis not present

## 2017-08-03 DIAGNOSIS — N183 Chronic kidney disease, stage 3 unspecified: Secondary | ICD-10-CM

## 2017-08-03 DIAGNOSIS — E663 Overweight: Secondary | ICD-10-CM

## 2017-08-03 DIAGNOSIS — R7309 Other abnormal glucose: Secondary | ICD-10-CM

## 2017-08-03 DIAGNOSIS — J449 Chronic obstructive pulmonary disease, unspecified: Secondary | ICD-10-CM | POA: Diagnosis not present

## 2017-08-03 DIAGNOSIS — E559 Vitamin D deficiency, unspecified: Secondary | ICD-10-CM

## 2017-08-03 DIAGNOSIS — M1990 Unspecified osteoarthritis, unspecified site: Secondary | ICD-10-CM

## 2017-08-03 DIAGNOSIS — G4700 Insomnia, unspecified: Secondary | ICD-10-CM | POA: Diagnosis not present

## 2017-08-03 DIAGNOSIS — Z79899 Other long term (current) drug therapy: Secondary | ICD-10-CM | POA: Diagnosis not present

## 2017-08-03 DIAGNOSIS — J4489 Other specified chronic obstructive pulmonary disease: Secondary | ICD-10-CM

## 2017-08-03 LAB — COMPLETE METABOLIC PANEL WITH GFR
AG Ratio: 2 (calc) (ref 1.0–2.5)
ALT: 49 U/L — ABNORMAL HIGH (ref 6–29)
AST: 41 U/L — ABNORMAL HIGH (ref 10–35)
Albumin: 4.3 g/dL (ref 3.6–5.1)
Alkaline phosphatase (APISO): 100 U/L (ref 33–130)
BUN/Creatinine Ratio: 18 (calc) (ref 6–22)
BUN: 17 mg/dL (ref 7–25)
CO2: 31 mmol/L (ref 20–32)
Calcium: 10.2 mg/dL (ref 8.6–10.4)
Chloride: 99 mmol/L (ref 98–110)
Creat: 0.96 mg/dL — ABNORMAL HIGH (ref 0.60–0.93)
GFR, Est African American: 67 mL/min/{1.73_m2} (ref >=60)
GFR, Est Non African American: 57 mL/min/{1.73_m2} — ABNORMAL LOW (ref >=60)
Globulin: 2.2 g/dL (calc) (ref 1.9–3.7)
Glucose, Bld: 97 mg/dL (ref 65–99)
Potassium: 5.4 mmol/L — ABNORMAL HIGH (ref 3.5–5.3)
Sodium: 137 mmol/L (ref 135–146)
Total Bilirubin: 0.8 mg/dL (ref 0.2–1.2)
Total Protein: 6.5 g/dL (ref 6.1–8.1)

## 2017-08-03 LAB — CBC WITH DIFFERENTIAL/PLATELET
Basophils Absolute: 57 cells/uL (ref 0–200)
Basophils Relative: 0.7 %
Eosinophils Absolute: 373 cells/uL (ref 15–500)
Eosinophils Relative: 4.6 %
HCT: 38 % (ref 35.0–45.0)
Hemoglobin: 12.8 g/dL (ref 11.7–15.5)
Lymphs Abs: 1393 cells/uL (ref 850–3900)
MCH: 31.3 pg (ref 27.0–33.0)
MCHC: 33.7 g/dL (ref 32.0–36.0)
MCV: 92.9 fL (ref 80.0–100.0)
MPV: 10.3 fL (ref 7.5–12.5)
Monocytes Relative: 7.3 %
Neutro Abs: 5686 cells/uL (ref 1500–7800)
Neutrophils Relative %: 70.2 %
Platelets: 242 10*3/uL (ref 140–400)
RBC: 4.09 10*6/uL (ref 3.80–5.10)
RDW: 12.3 % (ref 11.0–15.0)
Total Lymphocyte: 17.2 %
WBC mixed population: 591 cells/uL (ref 200–950)
WBC: 8.1 10*3/uL (ref 3.8–10.8)

## 2017-08-03 LAB — LIPID PANEL
Cholesterol: 189 mg/dL (ref <200)
HDL: 57 mg/dL (ref >50)
LDL Cholesterol (Calc): 106 mg/dL (calc) — ABNORMAL HIGH
Non-HDL Cholesterol (Calc): 132 mg/dL (calc) — ABNORMAL HIGH (ref <130)
Total CHOL/HDL Ratio: 3.3 (calc) (ref <5.0)
Triglycerides: 148 mg/dL (ref <150)

## 2017-08-03 LAB — TSH: TSH: 3.05 mIU/L (ref 0.40–4.50)

## 2017-08-03 NOTE — Patient Instructions (Signed)
Please bring by living will/ advanced directive paperwork  Aim for 7+ servings of fruits and vegetables daily  80+ fluid ounces of water or unsweet tea for healthy kidneys  Limit alcohol intake, avoid smoking  Limit animal fats in diet for cholesterol and heart health - choose grass fed whenever available  Aim for low stress - take time to unwind and care for your mental health  Aim for 150 min of moderate intensity exercise weekly for heart health, and weights twice weekly for bone health  Aim for 7-9 hours of sleep daily      When it comes to diets, agreement about the perfect plan isn't easy to find, even among the experts. Experts at the [employer] developed an idea known as the Healthy Eating Plate. Just imagine a plate divided into logical, healthy portions.  The emphasis is on diet quality:  Load up on vegetables and fruits - one-half of your plate: Aim for color and variety, and remember that potatoes don't count.  Go for whole grains - one-quarter of your plate: Whole wheat, barley, wheat berries, quinoa, oats, brown rice, and foods made with them. If you want pasta, go with whole wheat pasta.  Protein power - one-quarter of your plate: Fish, chicken, beans, and nuts are all healthy, versatile protein sources. Limit red meat.  The diet, however, does go beyond the plate, offering a few other suggestions.  Use healthy plant oils, such as olive, canola, soy, corn, sunflower and peanut. Check the labels, and avoid partially hydrogenated oil, which have unhealthy trans fats.  If you're thirsty, drink water. Coffee and tea are good in moderation, but skip sugary drinks and limit milk and dairy products to one or two daily servings.  The type of carbohydrate in the diet is more important than the amount. Some sources of carbohydrates, such as vegetables, fruits, whole grains, and beans-are healthier than others.  Finally, stay active.

## 2017-08-25 DIAGNOSIS — H2512 Age-related nuclear cataract, left eye: Secondary | ICD-10-CM | POA: Diagnosis not present

## 2017-08-25 DIAGNOSIS — H25812 Combined forms of age-related cataract, left eye: Secondary | ICD-10-CM | POA: Diagnosis not present

## 2017-08-30 ENCOUNTER — Other Ambulatory Visit: Payer: Self-pay | Admitting: Internal Medicine

## 2017-09-01 ENCOUNTER — Other Ambulatory Visit: Payer: Medicare Other

## 2017-09-01 DIAGNOSIS — E875 Hyperkalemia: Secondary | ICD-10-CM | POA: Diagnosis not present

## 2017-09-01 LAB — BASIC METABOLIC PANEL WITH GFR
BUN/Creatinine Ratio: 19 (calc) (ref 6–22)
BUN: 24 mg/dL (ref 7–25)
CO2: 30 mmol/L (ref 20–32)
Calcium: 9.8 mg/dL (ref 8.6–10.4)
Chloride: 98 mmol/L (ref 98–110)
Creat: 1.24 mg/dL — ABNORMAL HIGH (ref 0.60–0.93)
GFR, Est African American: 49 mL/min/{1.73_m2} — ABNORMAL LOW (ref >=60)
GFR, Est Non African American: 42 mL/min/{1.73_m2} — ABNORMAL LOW (ref >=60)
Glucose, Bld: 97 mg/dL (ref 65–99)
Potassium: 4.4 mmol/L (ref 3.5–5.3)
Sodium: 135 mmol/L (ref 135–146)

## 2017-09-11 ENCOUNTER — Other Ambulatory Visit: Payer: Self-pay | Admitting: Adult Health

## 2017-09-11 ENCOUNTER — Other Ambulatory Visit: Payer: Self-pay | Admitting: Internal Medicine

## 2017-09-11 DIAGNOSIS — I10 Essential (primary) hypertension: Secondary | ICD-10-CM

## 2017-09-19 DIAGNOSIS — Z87440 Personal history of urinary (tract) infections: Secondary | ICD-10-CM | POA: Diagnosis not present

## 2017-09-19 DIAGNOSIS — N952 Postmenopausal atrophic vaginitis: Secondary | ICD-10-CM | POA: Diagnosis not present

## 2017-09-19 DIAGNOSIS — R3121 Asymptomatic microscopic hematuria: Secondary | ICD-10-CM | POA: Diagnosis not present

## 2017-09-21 ENCOUNTER — Other Ambulatory Visit: Payer: Self-pay | Admitting: Internal Medicine

## 2017-10-07 ENCOUNTER — Other Ambulatory Visit: Payer: Self-pay | Admitting: Adult Health

## 2017-11-08 ENCOUNTER — Ambulatory Visit: Payer: Medicare Other | Admitting: Internal Medicine

## 2017-11-08 ENCOUNTER — Encounter: Payer: Self-pay | Admitting: Internal Medicine

## 2017-11-08 VITALS — BP 114/66 | HR 64 | Temp 97.0°F | Resp 16 | Ht 65.0 in | Wt 169.2 lb

## 2017-11-08 DIAGNOSIS — N183 Chronic kidney disease, stage 3 unspecified: Secondary | ICD-10-CM

## 2017-11-08 DIAGNOSIS — D519 Vitamin B12 deficiency anemia, unspecified: Secondary | ICD-10-CM

## 2017-11-08 DIAGNOSIS — Z8249 Family history of ischemic heart disease and other diseases of the circulatory system: Secondary | ICD-10-CM

## 2017-11-08 DIAGNOSIS — E782 Mixed hyperlipidemia: Secondary | ICD-10-CM

## 2017-11-08 DIAGNOSIS — I10 Essential (primary) hypertension: Secondary | ICD-10-CM | POA: Diagnosis not present

## 2017-11-08 DIAGNOSIS — Z79899 Other long term (current) drug therapy: Secondary | ICD-10-CM

## 2017-11-08 DIAGNOSIS — Z0001 Encounter for general adult medical examination with abnormal findings: Secondary | ICD-10-CM

## 2017-11-08 DIAGNOSIS — Z136 Encounter for screening for cardiovascular disorders: Secondary | ICD-10-CM

## 2017-11-08 DIAGNOSIS — R7309 Other abnormal glucose: Secondary | ICD-10-CM

## 2017-11-08 DIAGNOSIS — F325 Major depressive disorder, single episode, in full remission: Secondary | ICD-10-CM

## 2017-11-08 DIAGNOSIS — Z1212 Encounter for screening for malignant neoplasm of rectum: Secondary | ICD-10-CM

## 2017-11-08 DIAGNOSIS — R7303 Prediabetes: Secondary | ICD-10-CM

## 2017-11-08 DIAGNOSIS — N3000 Acute cystitis without hematuria: Secondary | ICD-10-CM

## 2017-11-08 DIAGNOSIS — Z0000 Encounter for general adult medical examination without abnormal findings: Secondary | ICD-10-CM | POA: Diagnosis not present

## 2017-11-08 DIAGNOSIS — E559 Vitamin D deficiency, unspecified: Secondary | ICD-10-CM | POA: Diagnosis not present

## 2017-11-08 DIAGNOSIS — Z1211 Encounter for screening for malignant neoplasm of colon: Secondary | ICD-10-CM

## 2017-11-08 NOTE — Patient Instructions (Signed)

## 2017-11-08 NOTE — Progress Notes (Signed)
[facility] Unk Pinto, M.D.     Uvaldo Bristle. Silverio Lay, P.A.-C Liane Comber, [place] [address] [place] [address] Telephone [phone] Telefax [phone] Annual Screening/Preventative Visit & Comprehensive Evaluation &  Examination     This very nice 77 y.o. WWF presents for a Screening /Preventative Visit & comprehensive evaluation and management of multiple medical co-morbidities.  Patient has been followed for HTN, HLD, Prediabetes  and Vitamin D Deficiency.      HTN predates since 1996. Patient's BP has been controlled at home and patient denies any cardiac symptoms as chest pain, palpitations, shortness of breath, dizziness or ankle swelling. Today's BP is at goal - 114/66.      Patient's hyperlipidemia is controlled with diet and medications. Patient denies myalgias or other medication SE's. Last lipids were at goal: Lab Results  Component Value Date   CHOL 172 11/08/2017   HDL 52 11/08/2017   LDLCALC 98 11/08/2017   TRIG 128 11/08/2017   CHOLHDL 3.3 11/08/2017      Patient has hx/o prediabetes  (A1c 5.8%/2015 & 5.3%/05/2015)  and patient denies reactive hypoglycemic symptoms, visual blurring, diabetic polys or paresthesias. Last A1c was Normal & at goal: Lab Results  Component Value Date   HGBA1C 5.3 11/08/2017      Finally, patient has history of Vitamin D Deficiency and last Vitamin D was at goal: Lab Results  Component Value Date   VD25OH 69 11/08/2017   Current Outpatient Medications on File Prior to Visit  Medication Sig  . ALPRAZolam (XANAX) 0.5 MG tablet TAKE 1/2 TO 1 TAB 2 TO 3 X / DAY ONLY IF SEVERE ANXIETY & LIMIT TO 5 DAYS /WEEK TO AVOID ADDICTION  . aspirin 81 MG EC tablet Take 81 mg by mouth daily.    . cetirizine (ZYRTEC) 10 MG tablet Take 10 mg by mouth as needed.    . Cholecalciferol (VITAMIN D3) 2000 UNITS capsule Take 2,000 Units by mouth daily.    .  citalopram (CELEXA) 40 MG tablet TAKE 1 TABLET BY MOUTH EVERY DAY  . clotrimazole-betamethasone (LOTRISONE) cream APPLY TO AFFECTED AREA 2 TIMES DAILY  . conjugated estrogens (PREMARIN) vaginal cream Place 1 Applicatorful vaginally. Use once every other week  . diclofenac (VOLTAREN) 75 MG EC tablet TAKE 1 TABLET (75 MG TOTAL) BY MOUTH 2 (TWO) TIMES DAILY.  Marland Kitchen Flaxseed, Linseed, (FLAX SEED OIL) 1000 MG CAPS Take 1,000 mg by mouth daily.  . hydrochlorothiazide (HYDRODIURIL) 25 MG tablet TAKE 1 TABLET BY MOUTH EVERY DAY  . losartan (COZAAR) 100 MG tablet TAKE 1 TABLET DAILY FOR FOR BLOOD PRESSURE  . Magnesium 400 MG CAPS Take 400 mg by mouth daily.  . montelukast (SINGULAIR) 10 MG tablet TAKE 1 TABLET BY MOUTH EVERY DAY  . Multiple Vitamin (MULTIVITAMIN) tablet Take 1 tablet by mouth daily.    . NON FORMULARY Calcium 1800 mg 1 tablet daily   . OVER THE COUNTER MEDICATION Otc Mucinex 1 tab daily  . Probiotic Product (PROBIOTIC-10) CAPS Take 1 tablet by mouth daily.  . simvastatin (ZOCOR) 40 MG tablet TAKE 1 TABLET (40 MG TOTAL) BY MOUTH AT BEDTIME.  Marland Kitchen tiZANidine (ZANAFLEX) 4 MG tablet Take 0.5-1 tablets (2-4 mg total) by mouth every 8 (eight) hours as needed for muscle spasms.  . traZODone (DESYREL) 150 MG tablet TAKE 1/2 TO 1 TABLET BY MOUTH ONE HOUR PRIOR TO BEDTIME  . vitamin C (ASCORBIC ACID) 500 MG tablet Take 500  mg by mouth daily.   No current facility-administered medications on file prior to visit.    Allergies  Allergen Reactions  . Ace Inhibitors     Cough  . Ampicillin     rash  . Mevacor [Lovastatin]     Elevated LFT's  . Penicillins     REACTION: hives and  itching  . Sulfa Antibiotics     rash   Past Medical History:  Diagnosis Date  . Anxiety   . Dyslipidemia    on Rx x20 years  . HTN (hypertension)   . Hyperlipidemia   . Insomnia   . Morton's neuroma of left foot   . Multiple allergies   . Osteoarthritis   . Polycythemia   . PVC (premature ventricular  contraction)    Health Maintenance  Topic Date Due  . MAMMOGRAM  12/10/2017  . TETANUS/TDAP  08/02/2023  . INFLUENZA VACCINE  Completed  . DEXA SCAN  Completed  . PNA vac Low Risk Adult  Completed   Immunization History  Administered Date(s) Administered  . Influenza Split 11/15/2011, 11/28/2012, 10/31/2013, 01/07/2014  . Influenza, High Dose Seasonal PF 11/27/2014, 11/28/2015, 10/30/2017  . Influenza-Unspecified 09/29/2016  . Pneumococcal Conjugate-13 10/02/2013  . Pneumococcal Polysaccharide-23 08/03/2017  . Tdap 08/01/2013  . Zoster 05/25/2011  . Zoster Recombinat (Shingrix) 11/10/2016   Last Colon - 05/15/2012 - Dr Watt Climes   Last MGM - 12/10/2016  Past Surgical History:  Procedure Laterality Date  . ABDOMINAL HYSTERECTOMY    . CATARACT EXTRACTION Right 2019   Dr. Ellie Lunch   . KNEE ARTHROSCOPY     R  . TONSILLECTOMY    . VESICOVAGINAL FISTULA CLOSURE W/ TAH     Family History  Problem Relation Age of Onset  . Osteoarthritis Mother   . Heart attack Mother   . Heart disease Mother   . Cancer Father        lung  . Osteoarthritis Father   . Hyperlipidemia Brother   . Diabetes Brother    Social History   Tobacco Use  . Smoking status: Never Smoker  . Smokeless tobacco: Never Used  . Tobacco comment: no tobacco   Substance Use Topics  . Alcohol use: Yes    Comment: rare  . Drug use: Not on file    ROS Constitutional: Denies fever, chills, weight loss/gain, headaches, insomnia,  night sweats, and change in appetite. Does c/o fatigue. Eyes: Denies redness, blurred vision, diplopia, discharge, itchy, watery eyes.  ENT: Denies discharge, congestion, post nasal drip, epistaxis, sore throat, earache, hearing loss, dental pain, Tinnitus, Vertigo, Sinus pain, snoring.  Cardio: Denies chest pain, palpitations, irregular heartbeat, syncope, dyspnea, diaphoresis, orthopnea, PND, claudication, edema Respiratory: denies cough, dyspnea, DOE, pleurisy, hoarseness, laryngitis,  wheezing.  Gastrointestinal: Denies dysphagia, heartburn, reflux, water brash, pain, cramps, nausea, vomiting, bloating, diarrhea, constipation, hematemesis, melena, hematochezia, jaundice, hemorrhoids Genitourinary: Denies dysuria, frequency, urgency, nocturia, hesitancy, discharge, hematuria, flank pain Breast: Breast lumps, nipple discharge, bleeding.  Musculoskeletal: Denies arthralgia, myalgia, stiffness, Jt. Swelling, pain, limp, and strain/sprain. Denies falls. Skin: Denies puritis, rash, hives, warts, acne, eczema, changing in skin lesion Neuro: No weakness, tremor, incoordination, spasms, paresthesia, pain Psychiatric: Denies confusion, memory loss, sensory loss. Denies Depression. Endocrine: Denies change in weight, skin, hair change, nocturia, and paresthesia, diabetic polys, visual blurring, hyper / hypo glycemic episodes.  Heme/Lymph: No excessive bleeding, bruising, enlarged lymph nodes.  Physical Exam  BP 114/66   Pulse 64   Temp (!) 97 F (36.1 C)   Resp  16   Ht 5\' 5"  (1.651 m)   Wt 169 lb 3.2 oz (76.7 kg)   BMI 28.16 kg/m   General Appearance: Well nourished, well groomed and in no apparent distress.  Eyes: PERRLA, EOMs, conjunctiva no swelling or erythema, normal fundi and vessels. Sinuses: No frontal/maxillary tenderness ENT/Mouth: EACs patent / TMs  nl. Nares clear without erythema, swelling, mucoid exudates. Oral hygiene is good. No erythema, swelling, or exudate. Tongue normal, non-obstructing. Tonsils not swollen or erythematous. Hearing normal.  Neck: Supple, thyroid not palpable. No bruits, nodes or JVD. Respiratory: Respiratory effort normal.  BS equal and clear bilateral without rales, rhonci, wheezing or stridor. Cardio: Heart sounds are normal with regular rate and rhythm and no murmurs, rubs or gallops. Peripheral pulses are normal and equal bilaterally without edema. No aortic or femoral bruits. Chest: symmetric with normal excursions and  percussion. Breasts: Symmetric, without lumps, nipple discharge, retractions, or fibrocystic changes.  Abdomen: Flat, soft with bowel sounds active. Nontender, no guarding, rebound, hernias, masses, or organomegaly.  Lymphatics: Non tender without lymphadenopathy.  Genitourinary:  Musculoskeletal: Full ROM all peripheral extremities, joint stability, 5/5 strength, and normal gait. Skin: Warm and dry without rashes, lesions, cyanosis, clubbing or  ecchymosis.  Neuro: Cranial nerves intact, reflexes equal bilaterally. Normal muscle tone, no cerebellar symptoms. Sensation intact.  Pysch: Alert and oriented X 3, normal affect, Insight and Judgment appropriate.   Assessment and Plan  1. Annual Preventative Screening Examination  2. Essential hypertension  - EKG 12-Lead - Urinalysis, Routine w reflex microscopic - Microalbumin / creatinine urine ratio - CBC with Differential/Platelet - COMPLETE METABOLIC PANEL WITH GFR - Magnesium - TSH  3. Hyperlipidemia, mixed  - EKG 12-Lead - Lipid panel - TSH  4. Abnormal glucose  - EKG 12-Lead - Hemoglobin A1c - Insulin, random  5. Vitamin D deficiency  - VITAMIN D 25 Hydroxyl  6. Prediabetes  - EKG 12-Lead - Hemoglobin A1c - Insulin, random  7. Stage 3 chronic kidney disease (HCC)  - Urinalysis, Routine w reflex microscopic - Microalbumin / creatinine urine ratio - COMPLETE METABOLIC PANEL WITH GFR  8. Anemia due to vitamin B12 deficiency, unspecified B12 deficiency type  - Vitamin B12 - CBC with Differential/Platelet  9. Depression, major, in remission ([place])   10. Encounter for colorectal cancer screening  - POC Hemoccult Bld/Stl  11. Screening for ischemic heart disease  - EKG 12-Lead  12. FHx: heart disease  - EKG 12-Lead  13. Medication management  - Urinalysis, Routine w reflex microscopic - Microalbumin / creatinine urine ratio - CBC with Differential/Platelet - COMPLETE METABOLIC PANEL WITH GFR -  Magnesium - Lipid panel - TSH - Hemoglobin A1c - Insulin, random - VITAMIN D 25 Hydroxyl  14. Acute cystitis without hematuria  - Urine Culture      Patient was counseled in prudent diet to achieve/maintain BMI less than 25 for weight control, BP monitoring, regular exercise and medications. Discussed med's effects and SE's. Screening labs and tests as requested with regular follow-up as recommended. Over 40 minutes of exam, counseling, chart review and high complex critical decision making was performed.

## 2017-11-09 ENCOUNTER — Other Ambulatory Visit: Payer: Self-pay | Admitting: Internal Medicine

## 2017-11-09 DIAGNOSIS — N3000 Acute cystitis without hematuria: Secondary | ICD-10-CM | POA: Diagnosis not present

## 2017-11-09 LAB — MICROALBUMIN / CREATININE URINE RATIO
Creatinine, Urine: 80 mg/dL (ref 20–275)
Microalb Creat Ratio: 138 mcg/mg creat — ABNORMAL HIGH (ref <30)
Microalb, Ur: 11 mg/dL

## 2017-11-09 LAB — COMPLETE METABOLIC PANEL WITH GFR
AG Ratio: 2.3 (calc) (ref 1.0–2.5)
ALT: 24 U/L (ref 6–29)
AST: 22 U/L (ref 10–35)
Albumin: 4.2 g/dL (ref 3.6–5.1)
Alkaline phosphatase (APISO): 85 U/L (ref 33–130)
BUN: 12 mg/dL (ref 7–25)
CO2: 28 mmol/L (ref 20–32)
Calcium: 9.6 mg/dL (ref 8.6–10.4)
Chloride: 99 mmol/L (ref 98–110)
Creat: 0.8 mg/dL (ref 0.60–0.93)
GFR, Est African American: 82 mL/min/{1.73_m2} (ref >=60)
GFR, Est Non African American: 71 mL/min/{1.73_m2} (ref >=60)
Globulin: 1.8 g/dL (calc) — ABNORMAL LOW (ref 1.9–3.7)
Glucose, Bld: 93 mg/dL (ref 65–99)
Potassium: 4.6 mmol/L (ref 3.5–5.3)
Sodium: 135 mmol/L (ref 135–146)
Total Bilirubin: 0.7 mg/dL (ref 0.2–1.2)
Total Protein: 6 g/dL — ABNORMAL LOW (ref 6.1–8.1)

## 2017-11-09 LAB — CBC WITH DIFFERENTIAL/PLATELET
Basophils Absolute: 51 cells/uL (ref 0–200)
Basophils Relative: 0.7 %
Eosinophils Absolute: 299 cells/uL (ref 15–500)
Eosinophils Relative: 4.1 %
HCT: 37 % (ref 35.0–45.0)
Hemoglobin: 12.5 g/dL (ref 11.7–15.5)
Lymphs Abs: 1460 cells/uL (ref 850–3900)
MCH: 32.1 pg (ref 27.0–33.0)
MCHC: 33.8 g/dL (ref 32.0–36.0)
MCV: 95.1 fL (ref 80.0–100.0)
MPV: 10.1 fL (ref 7.5–12.5)
Monocytes Relative: 6.3 %
Neutro Abs: 5030 cells/uL (ref 1500–7800)
Neutrophils Relative %: 68.9 %
Platelets: 258 10*3/uL (ref 140–400)
RBC: 3.89 10*6/uL (ref 3.80–5.10)
RDW: 12.1 % (ref 11.0–15.0)
Total Lymphocyte: 20 %
WBC mixed population: 460 cells/uL (ref 200–950)
WBC: 7.3 10*3/uL (ref 3.8–10.8)

## 2017-11-09 LAB — URINALYSIS, ROUTINE W REFLEX MICROSCOPIC
Bilirubin Urine: NEGATIVE
Glucose, UA: NEGATIVE
Hgb urine dipstick: NEGATIVE
Hyaline Cast: NONE SEEN /LPF
Ketones, ur: NEGATIVE
Nitrite: NEGATIVE
Specific Gravity, Urine: 1.013 (ref 1.001–1.03)
pH: >=8.5 — AB (ref 5.0–8.0)

## 2017-11-09 LAB — VITAMIN B12: Vitamin B-12: 403 pg/mL (ref 200–1100)

## 2017-11-09 LAB — TSH: TSH: 2.91 mIU/L (ref 0.40–4.50)

## 2017-11-09 LAB — LIPID PANEL
Cholesterol: 172 mg/dL (ref <200)
HDL: 52 mg/dL (ref >50)
LDL Cholesterol (Calc): 98 mg/dL (calc)
Non-HDL Cholesterol (Calc): 120 mg/dL (calc) (ref <130)
Total CHOL/HDL Ratio: 3.3 (calc) (ref <5.0)
Triglycerides: 128 mg/dL (ref <150)

## 2017-11-09 LAB — VITAMIN D 25 HYDROXY (VIT D DEFICIENCY, FRACTURES): Vit D, 25-Hydroxy: 69 ng/mL (ref 30–100)

## 2017-11-09 LAB — HEMOGLOBIN A1C
Hgb A1c MFr Bld: 5.3 % of total Hgb (ref <5.7)
Mean Plasma Glucose: 105 (calc)
eAG (mmol/L): 5.8 (calc)

## 2017-11-09 LAB — INSULIN, RANDOM: Insulin: 6.7 u[IU]/mL (ref 2.0–19.6)

## 2017-11-09 LAB — MAGNESIUM: Magnesium: 1.7 mg/dL (ref 1.5–2.5)

## 2017-11-10 LAB — URINE CULTURE
MICRO NUMBER:: 91185763
SPECIMEN QUALITY:: ADEQUATE

## 2017-11-13 ENCOUNTER — Encounter: Payer: Self-pay | Admitting: Internal Medicine

## 2017-11-15 ENCOUNTER — Encounter: Payer: Self-pay | Admitting: *Deleted

## 2017-12-02 ENCOUNTER — Other Ambulatory Visit: Payer: Self-pay | Admitting: Internal Medicine

## 2017-12-08 ENCOUNTER — Other Ambulatory Visit: Payer: Self-pay | Admitting: Internal Medicine

## 2017-12-11 ENCOUNTER — Other Ambulatory Visit: Payer: Self-pay | Admitting: Internal Medicine

## 2017-12-11 DIAGNOSIS — F419 Anxiety disorder, unspecified: Secondary | ICD-10-CM

## 2017-12-19 DIAGNOSIS — Z1231 Encounter for screening mammogram for malignant neoplasm of breast: Secondary | ICD-10-CM | POA: Diagnosis not present

## 2017-12-19 LAB — HM MAMMOGRAPHY

## 2017-12-21 ENCOUNTER — Other Ambulatory Visit: Payer: Self-pay

## 2017-12-21 ENCOUNTER — Ambulatory Visit (INDEPENDENT_AMBULATORY_CARE_PROVIDER_SITE_OTHER): Payer: Medicare Other | Admitting: Orthopaedic Surgery

## 2017-12-21 ENCOUNTER — Encounter (INDEPENDENT_AMBULATORY_CARE_PROVIDER_SITE_OTHER): Payer: Self-pay | Admitting: Orthopaedic Surgery

## 2017-12-21 DIAGNOSIS — M1712 Unilateral primary osteoarthritis, left knee: Secondary | ICD-10-CM

## 2017-12-21 DIAGNOSIS — M1711 Unilateral primary osteoarthritis, right knee: Secondary | ICD-10-CM

## 2017-12-21 MED ORDER — TIZANIDINE HCL 4 MG PO TABS: 4.0000 mg | ORAL_TABLET | Freq: Three times a day (TID) | ORAL | 6 refills | Status: DC | PRN

## 2017-12-21 MED ORDER — DICLOFENAC SODIUM 1 % TD GEL: 2.0000 g | Freq: Four times a day (QID) | TRANSDERMAL | 11 refills | Status: DC

## 2017-12-21 MED ORDER — DICLOFENAC SODIUM 75 MG PO TBEC: 75.0000 mg | DELAYED_RELEASE_TABLET | Freq: Two times a day (BID) | ORAL | 11 refills | Status: DC

## 2017-12-21 NOTE — Progress Notes (Signed)
Office Visit Note   Patient: Kelsey Lopez           Date of Birth: 1940-10-30           MRN: [id number] Visit Date: 12/21/2017              Requested by: Unk Pinto, [address] [place], [place] [address] PCP: Unk Pinto, MD   Assessment & Plan: Visit Diagnoses:  1. Unilateral primary osteoarthritis, left knee   2. Unilateral primary osteoarthritis, right knee     Plan: Impression is 77 year old female with stable bilateral knee osteoarthritis that doing well with medicines.  I gave her sample of pennsaid and I refilled her diclofenac as well as tizanidine.  I gave her a prescription for Voltaren gel also.  Follow-up as needed.  Follow-Up Instructions: Return if symptoms worsen or fail to improve.   Orders:  No orders of the defined types were placed in this encounter.  Meds ordered this encounter  Medications  . diclofenac sodium (VOLTAREN) 1 % GEL    Sig: Apply 2 g topically 4 (four) times daily.    Dispense:  1 Tube    Refill:  11  . diclofenac (VOLTAREN) 75 MG EC tablet    Sig: Take 1 tablet (75 mg total) by mouth 2 (two) times daily.    Dispense:  60 tablet    Refill:  11  . tiZANidine (ZANAFLEX) 4 MG tablet    Sig: Take 1 tablet (4 mg total) by mouth every 8 (eight) hours as needed for muscle spasms.    Dispense:  30 tablet    Refill:  6      Procedures: No procedures performed   Clinical Data: No additional findings.   Subjective: Chief Complaint  Patient presents with  . Right Knee - Pain, Follow-up  . Left Knee - Pain, Follow-up    Ann follows up today for bilateral knee pain.  Overall she is doing very well.  She is using diclofenac and tizanidine as needed.  Her previous injection was a year ago but overall she does not really feel like she needs another injection today.   Review of Systems   Objective: Vital Signs: There were no vitals taken for this visit.  Physical Exam  Ortho Exam Bilateral knee  exams are relatively unremarkable. Specialty Comments:  No specialty comments available.  Imaging: No results found.   PMFS History: Patient Active Problem List   Diagnosis Date Noted  . Stage 3 chronic kidney disease ([place]) 01/14/2017  . Depression, major, in remission ([place]) 11/27/2014  . Overweight (BMI 25.0-29.9) 04/10/2014  . COPD (chronic obstructive pulmonary disease) with chronic bronchitis ([place]) 04/10/2014  . Insomnia 04/10/2014  . Vitamin D deficiency 01/07/2014  . Abnormal blood sugar 01/03/2014  . Medication management 01/03/2014  . Anxiety   . Morton's neuroma of left foot   . Osteoarthritis   . Mixed hyperlipidemia 06/05/2009  . Essential hypertension 06/05/2009   Past Medical History:  Diagnosis Date  . Anxiety   . Dyslipidemia    on Rx x20 years  . HTN (hypertension)   . Hyperlipidemia   . Insomnia   . Morton's neuroma of left foot   . Multiple allergies   . Osteoarthritis   . Polycythemia   . PVC (premature ventricular contraction)     Family History  Problem Relation Age of Onset  . Osteoarthritis Mother   . Heart attack Mother   . Heart disease Mother   .  Cancer Father        lung  . Osteoarthritis Father   . Hyperlipidemia Brother   . Diabetes Brother     Past Surgical History:  Procedure Laterality Date  . ABDOMINAL HYSTERECTOMY    . CATARACT EXTRACTION Right 2019   Dr. Ellie Lunch   . KNEE ARTHROSCOPY     R  . TONSILLECTOMY    . VESICOVAGINAL FISTULA CLOSURE W/ TAH     Social History   Occupational History  . Not on file  Tobacco Use  . Smoking status: Never Smoker  . Smokeless tobacco: Never Used  . Tobacco comment: no tobacco   Substance and Sexual Activity  . Alcohol use: Yes    Comment: rare  . Drug use: Not on file  . Sexual activity: Not on file

## 2017-12-21 NOTE — Patient Outreach (Signed)
[facility]) Care Management  12/21/2017  Kelsey Lopez 26-Dec-1940 [id number]   Medication Adherence call to [name] spoke with patient she is showing past due on Simvastatin 40 mg she pick up from [facility] on 12/08/17 for a 90 days supply. [name] is showing past due under [place].   [place] Management Direct Dial [phone]  Fax [phone] [name]

## 2018-02-13 ENCOUNTER — Ambulatory Visit: Payer: Self-pay | Admitting: Physician Assistant

## 2018-02-13 NOTE — Progress Notes (Signed)
Patient ID: Kelsey Lopez, female   DOB: 01/04/1941, 78 y.o.   MRN: [id number] MEDICARE ANNUAL WELLNESS VISIT AND 3 month  Assessment:    Encounter for annual medicare wellness visit  defer dexa per patient request Repeat in 1 year  Essential hypertension - continue medications, DASH diet, exercise and monitor at home. Call if greater than 130/80.  - CMP/GFR - TSH   Other abnormal glucose Discussed general issues about diabetes pathophysiology and management., Educational material distributed., Suggested low cholesterol diet., Encouraged aerobic exercise., Discussed foot care., Reminded to get yearly retinal exam.  Mixed hyperlipidemia -continue medications, check lipids, decrease fatty foods, increase activity.  - Lipid panel   Depression/ Anxiety Well managed by current regimen; continue medications Stress management techniques discussed, increase water, good sleep hygiene discussed, increase exercise, and increase veggies.   Medication management - Magnesium   Vitamin D deficiency - Vit D  25 hydroxy (rtn osteoporosis monitoring)  Osteoarthritis, unspecified osteoarthritis type, unspecified site RICE, NSAIDS, followed by Dr. Erlinda Hong  OVERWEIGHT Obesity with co morbidities- long discussion about weight loss, diet, and exercise  COPD (chronic obstructive pulmonary disease) with chronic bronchitis  continue meds.    Insomnia Insomnia- good sleep hygiene discussed, increase day time activity, try melatonin or benadryl if this does not help we will call in sleep medication.  - continue trazodone  Stage 3 CKD Increase fluids, avoid NSAIDS, monitor sugars, will monitor  Eye lid dermatitis Given samples of eucrisa Has follow up derm THursday  Future Appointments  Date Time Provider [place]  05/22/2018  2:30 PM Unk Pinto, MD [facility] None  11/23/2018 10:00 AM Unk Pinto, MD [facility] None      Plan:   During the course of the visit the patient  was educated and counseled about appropriate screening and preventive services including:    Pneumococcal vaccine   Influenza vaccine  Td vaccine  Screening electrocardiogram  Screening mammography  Bone densitometry screening  Colorectal cancer screening  Diabetes screening  Glaucoma screening  Nutrition counseling   Advanced directives: given information/requested   Subjective:   Kelsey Lopez is a 78 y.o. female who presents for Medicare Annual Wellness Visit and 3 month follow up for HTN, chol, and preDM.     Woke up this AM with rash around her eyes, has appointment with derm this week. No new creams, no new medicines. No sore throat, fever, chills.   She has COPD due to being former smoker.  She is on celexa for anxiety, started after her husband passed 8 years ago and think it is still helping, she is on xanax PRN (takes 1/2 tab once daily) and she is on trazodone.   Following with Dr. Erlinda Hong for knee pain, bone on bone, doesn't want surgery, PT helped and doing well with occasional injections.   BMI is Body mass index is 28.99 kg/m., she has been working on diet and exercise. Wt Readings from Last 3 Encounters:  02/14/18 174 lb 3.2 oz (79 kg)  11/08/17 169 lb 3.2 oz (76.7 kg)  08/03/17 167 lb (75.8 kg)   Her blood pressure has been controlled at home, today their BP is BP: 122/68 She does workout. She denies chest pain, shortness of breath, dizziness.   She is on cholesterol medication and denies myalgias. Her cholesterol is at goal. The cholesterol last visit was:   Lab Results  Component Value Date   CHOL 172 11/08/2017   HDL 52 11/08/2017   LDLCALC 98 11/08/2017  TRIG 128 11/08/2017   CHOLHDL 3.3 11/08/2017   Last A1C in the office was:  Lab Results  Component Value Date   HGBA1C 5.3 11/08/2017   Patient is on Vitamin D supplement.   Lab Results  Component Value Date   VD25OH 69 11/08/2017         Medication Review Current Outpatient  Medications on File Prior to Visit  Medication Sig Dispense Refill  . ALPRAZolam (XANAX) 0.5 MG tablet TAKE 1/2-1 TAB 2-3 TIMES DAILY ONLY IF SEVERE ANXIETY & LIMIT TO 5 DAYS PER WEEK TO AVOID ADDICTION 90 tablet 0  . aspirin 81 MG EC tablet Take 81 mg by mouth daily.      . cetirizine (ZYRTEC) 10 MG tablet Take 10 mg by mouth as needed.      . Cholecalciferol (VITAMIN D3) 2000 UNITS capsule Take 2,000 Units by mouth daily.      . citalopram (CELEXA) 40 MG tablet TAKE 1 TABLET BY MOUTH EVERY DAY 90 tablet 1  . clotrimazole-betamethasone (LOTRISONE) cream APPLY TO AFFECTED AREA 2 TIMES DAILY 45 g 11  . conjugated estrogens (PREMARIN) vaginal cream Place 1 Applicatorful vaginally. Use once every other week    . diclofenac (VOLTAREN) 75 MG EC tablet TAKE 1 TABLET (75 MG TOTAL) BY MOUTH 2 (TWO) TIMES DAILY. 90 tablet 3  . diclofenac sodium (VOLTAREN) 1 % GEL Apply 2 g topically 4 (four) times daily. 1 Tube 11  . Flaxseed, Linseed, (FLAX SEED OIL) 1000 MG CAPS Take 1,000 mg by mouth daily.    . hydrochlorothiazide (HYDRODIURIL) 25 MG tablet TAKE 1 TABLET BY MOUTH EVERY DAY 90 tablet 1  . losartan (COZAAR) 100 MG tablet TAKE 1 TABLET DAILY FOR FOR BLOOD PRESSURE 90 tablet 1  . Magnesium 400 MG CAPS Take 400 mg by mouth daily.    . montelukast (SINGULAIR) 10 MG tablet TAKE 1 TABLET BY MOUTH EVERY DAY 90 tablet 1  . Multiple Vitamin (MULTIVITAMIN) tablet Take 1 tablet by mouth daily.      . NON FORMULARY Calcium 1800 mg 1 tablet daily     . OVER THE COUNTER MEDICATION Otc Mucinex 1 tab daily    . Probiotic Product (PROBIOTIC-10) CAPS Take 1 tablet by mouth daily.    . simvastatin (ZOCOR) 40 MG tablet TAKE 1 TABLET (40 MG TOTAL) BY MOUTH AT BEDTIME. 90 tablet 1  . tiZANidine (ZANAFLEX) 4 MG tablet Take 1 tablet (4 mg total) by mouth every 8 (eight) hours as needed for muscle spasms. 30 tablet 6  . traZODone (DESYREL) 150 MG tablet TAKE 1/2 TO 1 TABLET BY MOUTH ONE HOUR PRIOR TO BEDTIME 90 tablet 1  .  vitamin C (ASCORBIC ACID) 500 MG tablet Take 500 mg by mouth daily.     No current facility-administered medications on file prior to visit.     Current Problems (verified) Patient Active Problem List   Diagnosis Date Noted  . Stage 3 chronic kidney disease ([place]) 01/14/2017  . Depression, major, in remission ([place]) 11/27/2014  . Overweight (BMI 25.0-29.9) 04/10/2014  . COPD (chronic obstructive pulmonary disease) with chronic bronchitis ([place]) 04/10/2014  . Insomnia 04/10/2014  . Vitamin D deficiency 01/07/2014  . Abnormal blood sugar 01/03/2014  . Medication management 01/03/2014  . Anxiety   . Morton's neuroma of left foot   . Osteoarthritis   . Mixed hyperlipidemia 06/05/2009  . Essential hypertension 06/05/2009    Immunization History  Administered Date(s) Administered  . Influenza Split 11/15/2011, 11/28/2012, 10/31/2013,  01/07/2014  . Influenza, High Dose Seasonal PF 11/27/2014, 11/28/2015, 10/30/2017  . Influenza-Unspecified 09/29/2016  . Pneumococcal Conjugate-13 10/02/2013  . Pneumococcal Polysaccharide-23 08/03/2017  . Tdap 08/01/2013  . Zoster 05/25/2011  . Zoster Recombinat (Shingrix) 11/10/2016    Preventative care: Last colonoscopy: 05/2012 - getting colonoscopy Jan 29th.  Last mammogram: 12/2017 Last pap smear/pelvic exam: 2007  DEXA:07/19/13 osteopenia  ECHO: 07/14/09 CXR 08/2014  Prior vaccinations: TDAP: 2015  Influenza: 2019 Pneumococcal: 2004, 2019- given today Prevnar 13: 2015 Shingles/Zostavax:  2019  Dr. Ellie Lunch, (Eye), last visit 2019 [place], (Dentist), 2019, goes q36m  Patient Care Team: Unk Pinto, MD as PCP - General (Internal Medicine) Druscilla Brownie, MD as Consulting Physician (Dermatology) Clarene Essex, MD as Consulting Physician (Gastroenterology) Fay Records, MD as Consulting Physician (Cardiology) Leandrew Koyanagi, MD as Attending Physician (Orthopedic Surgery)   Allergies Allergies  Allergen Reactions  . Ace Inhibitors      Cough  . Ampicillin     rash  . Mevacor [Lovastatin]     Elevated LFT's  . Penicillins     REACTION: hives and  itching  . Sulfa Antibiotics     rash    SURGICAL HISTORY She  has a past surgical history that includes Tonsillectomy; Vesicovaginal fistula closure w/ TAH; Knee arthroscopy; Abdominal hysterectomy; and Cataract extraction (Right, 2019). FAMILY HISTORY Her family history includes Cancer in her father; Diabetes in her brother; Heart attack in her mother; Heart disease in her mother; Hyperlipidemia in her brother; Osteoarthritis in her father and mother. SOCIAL HISTORY She  reports that she has never smoked. She has never used smokeless tobacco. She reports current alcohol use.  Physical activity: Current Exercise Habits: Home exercise routine, Type of exercise: walking, Intensity: Mild Cardiac risk factors: Cardiac Risk Factors include: advanced age (>45men, >72 women);hypertension;dyslipidemia;smoking/ tobacco exposure Depression/mood screen:   Depression screen [facility] 2/9 02/14/2018  Decreased Interest 0  Down, Depressed, Hopeless 0  PHQ - 2 Score 0    ADLs:  In your present state of health, do you have any difficulty performing the following activities: 02/14/2018 11/13/2017  Hearing? N N  Vision? N N  Difficulty concentrating or making decisions? N N  Walking or climbing stairs? N N  Dressing or bathing? N N  Doing errands, shopping? N N  Some recent data might be hidden     Cognitive Testing  Alert? Yes  Normal Appearance?Yes  Oriented to person? Yes  Place? Yes   Time? Yes  Recall of three objects?  Yes  Can perform simple calculations? Yes  Displays appropriate judgment?Yes  Can read the correct time from a watch face?Yes  EOL planning: Does Patient Have a Medical Advance Directive?: Yes Type of Advance Directive: [place], Living will Does patient want to make changes to medical advance directive?: No - Patient declined   Objective:      Blood pressure 122/68, pulse 67, temperature 97.6 F (36.4 C), height 5\' 5"  (1.651 m), weight 174 lb 3.2 oz (79 kg), SpO2 96 %. Body mass index is 28.99 kg/m.  General appearance: alert, no distress, WD/WN,  female HEENT: normocephalic, sclerae anicteric, TMs pearly, nares patent, no discharge or erythema, pharynx normal Oral cavity: MMM, no lesions Neck: supple, no lymphadenopathy, no thyromegaly, no masses Heart: RRR, normal S1, S2, no murmurs Lungs: CTA bilaterally, no wheezes, rhonchi, or rales Abdomen: +bs, soft, non tender, non distended, no masses, no hepatomegaly, no splenomegaly Musculoskeletal: nontender, no swelling, no obvious deformity Extremities: no edema,  no cyanosis, no clubbing Pulses: 2+ symmetric, upper and lower extremities, normal cap refill Neurological: alert, oriented x 3, CN2-12 intact, strength normal upper extremities and lower extremities, sensation normal throughout, DTRs 2+ throughout, no cerebellar signs, gait normal Skin: erythematous rash around bilateral eyes Psychiatric: normal affect, behavior normal, pleasant  Breast:  defer Rectal: defer   Medicare Attestation I have personally reviewed: The patient's medical and social history Their use of alcohol, tobacco or illicit drugs Their current medications and supplements The patient's functional ability including ADLs,fall risks, home safety risks, cognitive, and hearing and visual impairment Diet and physical activities Evidence for depression or mood disorders  The patient's weight, height, BMI, and visual acuity have been recorded in the chart.  I have made referrals, counseling, and provided education to the patient based on review of the above and I have provided the patient with a written personalized care plan for preventive services.     Vicie Mutters, PA-C   02/14/2018

## 2018-02-14 ENCOUNTER — Ambulatory Visit: Payer: Medicare Other | Admitting: Physician Assistant

## 2018-02-14 ENCOUNTER — Encounter: Payer: Self-pay | Admitting: Physician Assistant

## 2018-02-14 VITALS — BP 122/68 | HR 67 | Temp 97.6°F | Ht 65.0 in | Wt 174.2 lb

## 2018-02-14 DIAGNOSIS — N183 Chronic kidney disease, stage 3 unspecified: Secondary | ICD-10-CM

## 2018-02-14 DIAGNOSIS — Z79899 Other long term (current) drug therapy: Secondary | ICD-10-CM

## 2018-02-14 DIAGNOSIS — Z0001 Encounter for general adult medical examination with abnormal findings: Secondary | ICD-10-CM | POA: Diagnosis not present

## 2018-02-14 DIAGNOSIS — F325 Major depressive disorder, single episode, in full remission: Secondary | ICD-10-CM

## 2018-02-14 DIAGNOSIS — G4700 Insomnia, unspecified: Secondary | ICD-10-CM

## 2018-02-14 DIAGNOSIS — E782 Mixed hyperlipidemia: Secondary | ICD-10-CM

## 2018-02-14 DIAGNOSIS — E559 Vitamin D deficiency, unspecified: Secondary | ICD-10-CM

## 2018-02-14 DIAGNOSIS — Z0000 Encounter for general adult medical examination without abnormal findings: Secondary | ICD-10-CM

## 2018-02-14 DIAGNOSIS — E663 Overweight: Secondary | ICD-10-CM

## 2018-02-14 DIAGNOSIS — R7309 Other abnormal glucose: Secondary | ICD-10-CM

## 2018-02-14 DIAGNOSIS — I10 Essential (primary) hypertension: Secondary | ICD-10-CM

## 2018-02-14 DIAGNOSIS — R6889 Other general symptoms and signs: Secondary | ICD-10-CM

## 2018-02-14 DIAGNOSIS — J449 Chronic obstructive pulmonary disease, unspecified: Secondary | ICD-10-CM

## 2018-02-14 DIAGNOSIS — F419 Anxiety disorder, unspecified: Secondary | ICD-10-CM

## 2018-02-14 NOTE — Patient Instructions (Signed)
    When it comes to diets, agreement about the perfect plan isn't easy to find, even among the experts. Experts at the [employer] developed an idea known as the Healthy Eating Plate. Just imagine a plate divided into logical, healthy portions.  The emphasis is on diet quality:  Load up on vegetables and fruits - one-half of your plate: Aim for color and variety, and remember that potatoes don't count.  Go for whole grains - one-quarter of your plate: Whole wheat, barley, wheat berries, quinoa, oats, brown rice, and foods made with them. If you want pasta, go with whole wheat pasta.  Protein power - one-quarter of your plate: Fish, chicken, beans, and nuts are all healthy, versatile protein sources. Limit red meat.  The diet, however, does go beyond the plate, offering a few other suggestions.  Use healthy plant oils, such as olive, canola, soy, corn, sunflower and peanut. Check the labels, and avoid partially hydrogenated oil, which have unhealthy trans fats.  If you're thirsty, drink water. Coffee and tea are good in moderation, but skip sugary drinks and limit milk and dairy products to one or two daily servings.  The type of carbohydrate in the diet is more important than the amount. Some sources of carbohydrates, such as vegetables, fruits, whole grains, and beans-are healthier than others.  Finally, stay active.   Google mindful eating and here are some tips and tricks below.   Rate your hunger before you eat on a scale of 1-10, try to eat closer to a 6 or higher. And if you are at below that, why are you eating? Slow down and listen to your body.

## 2018-02-15 LAB — CBC WITH DIFFERENTIAL/PLATELET
Absolute Monocytes: 639 cells/uL (ref 200–950)
Basophils Absolute: 61 cells/uL (ref 0–200)
Basophils Relative: 0.9 %
Eosinophils Absolute: 347 cells/uL (ref 15–500)
Eosinophils Relative: 5.1 %
HCT: 37 % (ref 35.0–45.0)
Hemoglobin: 12.9 g/dL (ref 11.7–15.5)
Lymphs Abs: 1720 cells/uL (ref 850–3900)
MCH: 32.7 pg (ref 27.0–33.0)
MCHC: 34.9 g/dL (ref 32.0–36.0)
MCV: 93.9 fL (ref 80.0–100.0)
MPV: 10.3 fL (ref 7.5–12.5)
Monocytes Relative: 9.4 %
Neutro Abs: 4032 cells/uL (ref 1500–7800)
Neutrophils Relative %: 59.3 %
Platelets: 232 10*3/uL (ref 140–400)
RBC: 3.94 10*6/uL (ref 3.80–5.10)
RDW: 11.9 % (ref 11.0–15.0)
Total Lymphocyte: 25.3 %
WBC: 6.8 10*3/uL (ref 3.8–10.8)

## 2018-02-15 LAB — COMPLETE METABOLIC PANEL WITH GFR
AG Ratio: 2 (calc) (ref 1.0–2.5)
ALT: 38 U/L — ABNORMAL HIGH (ref 6–29)
AST: 32 U/L (ref 10–35)
Albumin: 4 g/dL (ref 3.6–5.1)
Alkaline phosphatase (APISO): 89 U/L (ref 33–130)
BUN: 19 mg/dL (ref 7–25)
CO2: 30 mmol/L (ref 20–32)
Calcium: 9.8 mg/dL (ref 8.6–10.4)
Chloride: 100 mmol/L (ref 98–110)
Creat: 0.83 mg/dL (ref 0.60–0.93)
GFR, Est African American: 79 mL/min/{1.73_m2} (ref >=60)
GFR, Est Non African American: 68 mL/min/{1.73_m2} (ref >=60)
Globulin: 2 g/dL (calc) (ref 1.9–3.7)
Glucose, Bld: 84 mg/dL (ref 65–99)
Potassium: 3.5 mmol/L (ref 3.5–5.3)
Sodium: 136 mmol/L (ref 135–146)
Total Bilirubin: 0.7 mg/dL (ref 0.2–1.2)
Total Protein: 6 g/dL — ABNORMAL LOW (ref 6.1–8.1)

## 2018-02-15 LAB — TSH: TSH: 3.28 mIU/L (ref 0.40–4.50)

## 2018-02-15 LAB — LIPID PANEL
Cholesterol: 185 mg/dL (ref <200)
HDL: 60 mg/dL (ref >50)
LDL Cholesterol (Calc): 102 mg/dL (calc) — ABNORMAL HIGH
Non-HDL Cholesterol (Calc): 125 mg/dL (calc) (ref <130)
Total CHOL/HDL Ratio: 3.1 (calc) (ref <5.0)
Triglycerides: 125 mg/dL (ref <150)

## 2018-02-15 LAB — MAGNESIUM: Magnesium: 1.9 mg/dL (ref 1.5–2.5)

## 2018-02-16 DIAGNOSIS — Z85828 Personal history of other malignant neoplasm of skin: Secondary | ICD-10-CM | POA: Diagnosis not present

## 2018-02-16 DIAGNOSIS — L821 Other seborrheic keratosis: Secondary | ICD-10-CM | POA: Diagnosis not present

## 2018-02-16 DIAGNOSIS — D1801 Hemangioma of skin and subcutaneous tissue: Secondary | ICD-10-CM | POA: Diagnosis not present

## 2018-02-16 DIAGNOSIS — D485 Neoplasm of uncertain behavior of skin: Secondary | ICD-10-CM | POA: Diagnosis not present

## 2018-02-16 DIAGNOSIS — L814 Other melanin hyperpigmentation: Secondary | ICD-10-CM | POA: Diagnosis not present

## 2018-02-16 DIAGNOSIS — L570 Actinic keratosis: Secondary | ICD-10-CM | POA: Diagnosis not present

## 2018-03-08 DIAGNOSIS — Z86010 Personal history of colonic polyps: Secondary | ICD-10-CM | POA: Diagnosis not present

## 2018-03-08 DIAGNOSIS — K5730 Diverticulosis of large intestine without perforation or abscess without bleeding: Secondary | ICD-10-CM | POA: Diagnosis not present

## 2018-03-08 LAB — HM COLONOSCOPY

## 2018-03-10 ENCOUNTER — Other Ambulatory Visit: Payer: Self-pay | Admitting: Internal Medicine

## 2018-03-10 DIAGNOSIS — I10 Essential (primary) hypertension: Secondary | ICD-10-CM

## 2018-03-21 ENCOUNTER — Other Ambulatory Visit: Payer: Self-pay | Admitting: Adult Health

## 2018-03-27 ENCOUNTER — Encounter: Payer: Self-pay | Admitting: *Deleted

## 2018-03-29 ENCOUNTER — Other Ambulatory Visit: Payer: Self-pay | Admitting: Internal Medicine

## 2018-04-19 ENCOUNTER — Other Ambulatory Visit: Payer: Self-pay | Admitting: Internal Medicine

## 2018-04-19 DIAGNOSIS — F419 Anxiety disorder, unspecified: Secondary | ICD-10-CM

## 2018-05-21 ENCOUNTER — Encounter: Payer: Self-pay | Admitting: Internal Medicine

## 2018-05-21 ENCOUNTER — Other Ambulatory Visit: Payer: Self-pay | Admitting: Internal Medicine

## 2018-05-21 NOTE — Progress Notes (Signed)
History of Present Illness:      This very nice 78 y.o. WWF  presents for 6 month follow up with HTN, HLD, CKD2,  Pre-Diabetes and Vitamin D Deficiency.       Patient is treated for HTN (1996) & BP has been controlled at home. Today's BP is at goal- 118/80. Patient has had no complaints of any cardiac type chest pain, palpitations, dyspnea / orthopnea / PND, dizziness, claudication, or dependent edema.      Hyperlipidemia is controlled with diet & meds. Patient denies myalgias or other med SE's. Last Lipids were almost to goal: Lab Results  Component Value Date   CHOL 185 02/14/2018   HDL 60 02/14/2018   LDLCALC 102 (H) 02/14/2018   TRIG 125 02/14/2018   CHOLHDL 3.1 02/14/2018        Also, the patient has history of PreDiabetes (A1c 5.8% / 2015)  and has had no symptoms of reactive hypoglycemia, diabetic polys, paresthesias or visual blurring.  Last A1c was at goal: Lab Results  Component Value Date   HGBA1C 5.3 11/08/2017       Further, the patient also has history of Vitamin D Deficiency and supplements vitamin D without any suspected side-effects. Last vitamin D was at goal: Lab Results  Component Value Date   VD25OH 69 11/08/2017   Current Outpatient Medications on File Prior to Visit  Medication Sig  . ALPRAZolam (XANAX) 0.5 MG tablet TAKE 1/2-1 TAB 2-3 TIMES DAILY ONLY IF SEVERE ANXIETY & LIMIT TO 5 DAYS PER WEEK TO AVOID ADDICTION  . aspirin 81 MG EC tablet Take 81 mg by mouth daily.    . cetirizine (ZYRTEC) 10 MG tablet Take 10 mg by mouth as needed.    . Cholecalciferol (VITAMIN D3) 2000 UNITS capsule Take 2,000 Units by mouth daily.    . citalopram (CELEXA) 40 MG tablet TAKE 1 TABLET BY MOUTH EVERY DAY  . conjugated estrogens (PREMARIN) vaginal cream Place 1 Applicatorful vaginally. Use once every other week  . diclofenac (VOLTAREN) 75 MG EC tablet TAKE 1 TABLET (75 MG TOTAL) BY MOUTH 2 (TWO) TIMES DAILY. (Patient taking differently: Take 75 mg by mouth daily. )  .  diclofenac sodium (VOLTAREN) 1 % GEL Apply 2 g topically 4 (four) times daily.  . Flaxseed, Linseed, (FLAX SEED OIL) 1000 MG CAPS Take 1,000 mg by mouth daily.  . hydrochlorothiazide (HYDRODIURIL) 25 MG tablet Take 1 tablet daily for BP & Fluid  . losartan (COZAAR) 100 MG tablet Take 1 tablet Daily for BP  . Magnesium 400 MG CAPS Take 400 mg by mouth daily.  . montelukast (SINGULAIR) 10 MG tablet TAKE 1 TABLET BY MOUTH EVERY DAY  . Multiple Vitamin (MULTIVITAMIN) tablet Take 1 tablet by mouth daily.    . NON FORMULARY Calcium 1800 mg 1 tablet daily   . OVER THE COUNTER MEDICATION Otc Mucinex 1 tab daily  . Probiotic Product (PROBIOTIC-10) CAPS Take 1 tablet by mouth daily.  . simvastatin (ZOCOR) 40 MG tablet TAKE 1 TABLET (40 MG TOTAL) BY MOUTH AT BEDTIME.  Marland Kitchen tiZANidine (ZANAFLEX) 4 MG tablet Take 1 tablet (4 mg total) by mouth every 8 (eight) hours as needed for muscle spasms.  . traZODone (DESYREL) 150 MG tablet TAKE 1/2 TO 1 TABLET BY MOUTH ONE HOUR PRIOR TO BEDTIME  . vitamin C (ASCORBIC ACID) 500 MG tablet Take 500 mg by mouth daily.   No current facility-administered medications on file prior to visit.    Allergies  Allergen Reactions  . Ace Inhibitors     Cough  . Ampicillin     rash  . Mevacor [Lovastatin]     Elevated LFT's  . Penicillins     REACTION: hives and  itching  . Sulfa Antibiotics     rash   PMHx:   Past Medical History:  Diagnosis Date  . Anxiety   . Dyslipidemia    on Rx x20 years  . HTN (hypertension)   . Hyperlipidemia   . Insomnia   . Morton's neuroma of left foot   . Multiple allergies   . Osteoarthritis   . Polycythemia   . PVC (premature ventricular contraction)    Immunization History  Administered Date(s) Administered  . Influenza Split 11/15/2011, 11/28/2012, 10/31/2013, 01/07/2014  . Influenza, High Dose Seasonal PF 11/27/2014, 11/28/2015, 10/30/2017  . Influenza-Unspecified 09/29/2016  . Pneumococcal Conjugate-13 10/02/2013  .  Pneumococcal Polysaccharide-23 08/03/2017  . Tdap 08/01/2013  . Zoster 05/25/2011  . Zoster Recombinat (Shingrix) 11/10/2016   Past Surgical History:  Procedure Laterality Date  . ABDOMINAL HYSTERECTOMY    . CATARACT EXTRACTION Right 2019   Dr. Ellie Lunch   . KNEE ARTHROSCOPY     R  . TONSILLECTOMY    . VESICOVAGINAL FISTULA CLOSURE W/ TAH     FHx:    Reviewed / unchanged SHx:    Reviewed / unchanged   Systems Review:  Constitutional: Denies fever, chills, wt changes, headaches, insomnia, fatigue, night sweats, change in appetite. Eyes: Denies redness, blurred vision, diplopia, discharge, itchy, watery eyes.  ENT: Denies discharge, congestion, post nasal drip, epistaxis, sore throat, earache, hearing loss, dental pain, tinnitus, vertigo, sinus pain, snoring.  CV: Denies chest pain, palpitations, irregular heartbeat, syncope, dyspnea, diaphoresis, orthopnea, PND, claudication or edema. Respiratory: denies cough, dyspnea, DOE, pleurisy, hoarseness, laryngitis, wheezing.  Gastrointestinal: Denies dysphagia, odynophagia, heartburn, reflux, water brash, abdominal pain or cramps, nausea, vomiting, bloating, diarrhea, constipation, hematemesis, melena, hematochezia  or hemorrhoids. Genitourinary: Denies dysuria, frequency, urgency, nocturia, hesitancy, discharge, hematuria or flank pain. Musculoskeletal: Denies arthralgias, myalgias, stiffness, jt. swelling, pain, limping or strain/sprain.  Skin: Denies pruritus, rash, hives, warts, acne, eczema or change in skin lesion(s). Neuro: No weakness, tremor, incoordination, spasms, paresthesia or pain. Psychiatric: Denies confusion, memory loss or sensory loss. Endo: Denies change in weight, skin or hair change.  Heme/Lymph: No excessive bleeding, bruising or enlarged lymph nodes.  Physical Exam  BP 118/80   Pulse 72   Temp 97.7 F (36.5 C)   Resp 16   Ht 5\' 5"  (1.651 m)   Wt 171 lb 9.6 oz (77.8 kg)   BMI 28.56 kg/m   Appears  well  nourished, well groomed  and in no distress.  Eyes: PERRLA, EOMs, conjunctiva no swelling or erythema. Sinuses: No frontal/maxillary tenderness ENT/Mouth: EAC's clear, TM's nl w/o erythema, bulging. Nares clear w/o erythema, swelling, exudates. Oropharynx clear without erythema or exudates. Oral hygiene is good. Tongue normal, non obstructing. Hearing intact.  Neck: Supple. Thyroid not palpable. Car 2+/2+ without bruits, nodes or JVD. Chest: Respirations nl with BS clear & equal w/o rales, rhonchi, wheezing or stridor.  Cor: Heart sounds normal w/ regular rate and rhythm without sig. murmurs, gallops, clicks or rubs. Peripheral pulses normal and equal  without edema.  Abdomen: Soft & bowel sounds normal. Non-tender w/o guarding, rebound, hernias, masses or organomegaly.  Lymphatics: Unremarkable.  Musculoskeletal: Full ROM all peripheral extremities, joint stability, 5/5 strength and normal gait.  Skin: Warm, dry without exposed rashes, lesions or  ecchymosis apparent.  Neuro: Cranial nerves intact, reflexes equal bilaterally. Sensory-motor testing grossly intact. Tendon reflexes grossly intact.  Pysch: Alert & oriented x 3.  Insight and judgement nl & appropriate. No ideations.  Assessment and Plan:  1. Essential hypertension  - Continue medication, monitor blood pressure at home.  - Continue DASH diet.  Reminder to go to the ER if any CP,  SOB, nausea, dizziness, severe HA, changes vision/speech.  - CBC with Differential/Platelet - COMPLETE METABOLIC PANEL WITH GFR - Magnesium - TSH  2. Hyperlipidemia, mixed  - Continue diet/meds, exercise,& lifestyle modifications.  - Continue monitor periodic cholesterol/liver & renal functions   - Lipid panel - TSH  3. Abnormal glucose  - Continue diet, exercise  - Lifestyle modifications.  - Monitor appropriate labs.  - Hemoglobin A1c - Insulin, random  4. Vitamin D deficiency  - Continue supplementation.   - VITAMIN D 25  Hydroxyl  5. Prediabetes  - Hemoglobin A1c - Insulin, random  6. Stage 3 chronic kidney disease (HCC)  - COMPLETE METABOLIC PANEL WITH GFR  7. Medication management  - CBC with Differential/Platelet - COMPLETE METABOLIC PANEL WITH GFR - Magnesium - Lipid panel - TSH - Hemoglobin A1c - Insulin, random - VITAMIN D 25 Hydroxyl      Discussed  regular exercise, BP monitoring, weight control to achieve/maintain BMI less than 25 and discussed med and SE's. Recommended labs to assess and monitor clinical status with further disposition pending results of labs. I discussed the assessment and treatment plan with the patient. The patient was provided an opportunity to ask questions and all were answered. The patient agreed with the plan and demonstrated an understanding of the instructions. Over 30 minutes of exam, counseling, chart review was performed.   Kirtland Bouchard, MD

## 2018-05-21 NOTE — Patient Instructions (Signed)

## 2018-05-22 ENCOUNTER — Other Ambulatory Visit: Payer: Self-pay

## 2018-05-22 ENCOUNTER — Ambulatory Visit: Payer: Medicare Other | Admitting: Internal Medicine

## 2018-05-22 ENCOUNTER — Ambulatory Visit: Payer: Self-pay | Admitting: Internal Medicine

## 2018-05-22 VITALS — BP 118/80 | HR 72 | Temp 97.7°F | Resp 16 | Ht 65.0 in | Wt 171.6 lb

## 2018-05-22 DIAGNOSIS — E782 Mixed hyperlipidemia: Secondary | ICD-10-CM

## 2018-05-22 DIAGNOSIS — N182 Chronic kidney disease, stage 2 (mild): Secondary | ICD-10-CM

## 2018-05-22 DIAGNOSIS — R7303 Prediabetes: Secondary | ICD-10-CM | POA: Diagnosis not present

## 2018-05-22 DIAGNOSIS — Z79899 Other long term (current) drug therapy: Secondary | ICD-10-CM

## 2018-05-22 DIAGNOSIS — E559 Vitamin D deficiency, unspecified: Secondary | ICD-10-CM | POA: Diagnosis not present

## 2018-05-22 DIAGNOSIS — R7309 Other abnormal glucose: Secondary | ICD-10-CM

## 2018-05-22 DIAGNOSIS — I10 Essential (primary) hypertension: Secondary | ICD-10-CM | POA: Diagnosis not present

## 2018-05-23 LAB — LIPID PANEL
Cholesterol: 179 mg/dL (ref <200)
HDL: 62 mg/dL (ref >=50)
LDL Cholesterol (Calc): 96 mg/dL (calc)
Non-HDL Cholesterol (Calc): 117 mg/dL (calc) (ref <130)
Total CHOL/HDL Ratio: 2.9 (calc) (ref <5.0)
Triglycerides: 114 mg/dL (ref <150)

## 2018-05-23 LAB — COMPLETE METABOLIC PANEL WITH GFR
AG Ratio: 2.5 (calc) (ref 1.0–2.5)
ALT: 32 U/L — ABNORMAL HIGH (ref 6–29)
AST: 25 U/L (ref 10–35)
Albumin: 4.2 g/dL (ref 3.6–5.1)
Alkaline phosphatase (APISO): 79 U/L (ref 37–153)
BUN: 20 mg/dL (ref 7–25)
CO2: 29 mmol/L (ref 20–32)
Calcium: 9.6 mg/dL (ref 8.6–10.4)
Chloride: 100 mmol/L (ref 98–110)
Creat: 0.93 mg/dL (ref 0.60–0.93)
GFR, Est African American: 69 mL/min/{1.73_m2} (ref >=60)
GFR, Est Non African American: 59 mL/min/{1.73_m2} — ABNORMAL LOW (ref >=60)
Globulin: 1.7 g/dL (calc) — ABNORMAL LOW (ref 1.9–3.7)
Glucose, Bld: 88 mg/dL (ref 65–99)
Potassium: 3.8 mmol/L (ref 3.5–5.3)
Sodium: 136 mmol/L (ref 135–146)
Total Bilirubin: 0.7 mg/dL (ref 0.2–1.2)
Total Protein: 5.9 g/dL — ABNORMAL LOW (ref 6.1–8.1)

## 2018-05-23 LAB — CBC WITH DIFFERENTIAL/PLATELET
Absolute Monocytes: 576 cells/uL (ref 200–950)
Basophils Absolute: 47 cells/uL (ref 0–200)
Basophils Relative: 0.7 %
Eosinophils Absolute: 268 cells/uL (ref 15–500)
Eosinophils Relative: 4 %
HCT: 37.9 % (ref 35.0–45.0)
Hemoglobin: 13 g/dL (ref 11.7–15.5)
Lymphs Abs: 1441 cells/uL (ref 850–3900)
MCH: 31.9 pg (ref 27.0–33.0)
MCHC: 34.3 g/dL (ref 32.0–36.0)
MCV: 93.1 fL (ref 80.0–100.0)
MPV: 10.3 fL (ref 7.5–12.5)
Monocytes Relative: 8.6 %
Neutro Abs: 4368 cells/uL (ref 1500–7800)
Neutrophils Relative %: 65.2 %
Platelets: 217 10*3/uL (ref 140–400)
RBC: 4.07 10*6/uL (ref 3.80–5.10)
RDW: 12.6 % (ref 11.0–15.0)
Total Lymphocyte: 21.5 %
WBC: 6.7 10*3/uL (ref 3.8–10.8)

## 2018-05-23 LAB — HEMOGLOBIN A1C
Hgb A1c MFr Bld: 5.5 % of total Hgb (ref <5.7)
Mean Plasma Glucose: 111 (calc)
eAG (mmol/L): 6.2 (calc)

## 2018-05-23 LAB — INSULIN, RANDOM: Insulin: 7.9 u[IU]/mL

## 2018-05-23 LAB — MAGNESIUM: Magnesium: 1.8 mg/dL (ref 1.5–2.5)

## 2018-05-23 LAB — TSH: TSH: 2.51 mIU/L (ref 0.40–4.50)

## 2018-05-23 LAB — VITAMIN D 25 HYDROXY (VIT D DEFICIENCY, FRACTURES): Vit D, 25-Hydroxy: 53 ng/mL (ref 30–100)

## 2018-06-05 ENCOUNTER — Other Ambulatory Visit: Payer: Self-pay | Admitting: Internal Medicine

## 2018-07-10 ENCOUNTER — Other Ambulatory Visit: Payer: Self-pay | Admitting: Internal Medicine

## 2018-07-10 DIAGNOSIS — F419 Anxiety disorder, unspecified: Secondary | ICD-10-CM

## 2018-07-13 ENCOUNTER — Other Ambulatory Visit: Payer: Self-pay

## 2018-07-13 ENCOUNTER — Encounter: Payer: Self-pay | Admitting: Orthopaedic Surgery

## 2018-07-13 ENCOUNTER — Ambulatory Visit: Payer: Medicare Other | Admitting: Orthopaedic Surgery

## 2018-07-13 DIAGNOSIS — M1712 Unilateral primary osteoarthritis, left knee: Secondary | ICD-10-CM

## 2018-07-13 DIAGNOSIS — M1711 Unilateral primary osteoarthritis, right knee: Secondary | ICD-10-CM

## 2018-07-13 MED ORDER — METHYLPREDNISOLONE ACETATE 40 MG/ML IJ SUSP
40.0000 mg | INTRAMUSCULAR | Status: AC | PRN
  Administered 2018-07-13: 40 mg via INTRA_ARTICULAR

## 2018-07-13 MED ORDER — LIDOCAINE HCL 1 % IJ SOLN
2.0000 mL | INTRAMUSCULAR | Status: AC | PRN
  Administered 2018-07-13: 2 mL

## 2018-07-13 MED ORDER — BUPIVACAINE HCL 0.5 % IJ SOLN
2.0000 mL | INTRAMUSCULAR | Status: AC | PRN
  Administered 2018-07-13: 2 mL via INTRA_ARTICULAR

## 2018-07-13 NOTE — Progress Notes (Signed)
Office Visit Note   Patient: Kelsey Lopez           Date of Birth: [birth year]           MRN: [id number] Visit Date: 07/13/2018              Requested by: Unk Pinto, [address] [place], [place] [address] PCP: Unk Pinto, MD   Assessment & Plan: Visit Diagnoses:  1. Unilateral primary osteoarthritis, left knee   2. Unilateral primary osteoarthritis, right knee     Plan: Bilateral knee exam injections performed today under sterile conditions.  Patient tolerated this well.  We also discussed viscosupplementation injections which she may want to consider in the future.  Follow-Up Instructions: No follow-ups on file.   Orders:  No orders of the defined types were placed in this encounter.  No orders of the defined types were placed in this encounter.     Procedures: Large Joint Inj: bilateral knee on 07/13/2018 8:48 AM Indications: pain Details: 22 G needle  Arthrogram: No  Medications (Right): 2 mL lidocaine 1 %; 2 mL bupivacaine 0.5 %; 40 mg methylPREDNISolone acetate 40 MG/ML Medications (Left): 2 mL lidocaine 1 %; 2 mL bupivacaine 0.5 %; 40 mg methylPREDNISolone acetate 40 MG/ML Outcome: tolerated well, no immediate complications Patient was prepped and draped in the usual sterile fashion.       Clinical Data: No additional findings.   Subjective: Chief Complaint  Patient presents with  . Left Knee - Pain  . Right Knee - Pain  . Left Thumb - Pain  . Right Thumb - Pain    Kelsey Lopez comes in today for follow-up of bilateral knee osteoarthritis.  She has been getting great relief from cortisone injections.  Her last injection was over a year and a half ago.  She would like another injection in each knee today.   Review of Systems   Objective: Vital Signs: There were no vitals taken for this visit.  Physical Exam  Ortho Exam Knee exams are unremarkable Specialty Comments:  No specialty comments available.  Imaging: No  results found.   PMFS History: Patient Active Problem List   Diagnosis Date Noted  . Stage 3 chronic kidney disease ([place]) 01/14/2017  . Depression, major, in remission ([place]) 11/27/2014  . Overweight (BMI 25.0-29.9) 04/10/2014  . COPD (chronic obstructive pulmonary disease) with chronic bronchitis ([place]) 04/10/2014  . Insomnia 04/10/2014  . Vitamin D deficiency 01/07/2014  . Abnormal blood sugar 01/03/2014  . Medication management 01/03/2014  . Anxiety   . Morton's neuroma of left foot   . Osteoarthritis   . Mixed hyperlipidemia 06/05/2009  . Essential hypertension 06/05/2009   Past Medical History:  Diagnosis Date  . Anxiety   . Dyslipidemia    on Rx x20 years  . HTN (hypertension)   . Hyperlipidemia   . Insomnia   . Morton's neuroma of left foot   . Multiple allergies   . Osteoarthritis   . Polycythemia   . PVC (premature ventricular contraction)     Family History  Problem Relation Age of Onset  . Osteoarthritis Mother   . Heart attack Mother   . Heart disease Mother   . Cancer Father        lung  . Osteoarthritis Father   . Hyperlipidemia Brother   . Diabetes Brother     Past Surgical History:  Procedure Laterality Date  . ABDOMINAL HYSTERECTOMY    . CATARACT EXTRACTION Right 2019  Dr. Ellie Lunch   . KNEE ARTHROSCOPY     R  . TONSILLECTOMY    . VESICOVAGINAL FISTULA CLOSURE W/ TAH     Social History   Occupational History  . Not on file  Tobacco Use  . Smoking status: Never Smoker  . Smokeless tobacco: Never Used  . Tobacco comment: no tobacco   Substance and Sexual Activity  . Alcohol use: Yes    Comment: rare  . Drug use: Not on file  . Sexual activity: Not on file

## 2018-08-17 NOTE — Progress Notes (Signed)
FOLLOW UP  Assessment and Plan:   Diarrhea No AB pain with it, no urgency, blood Is up to day with colonoscopy Likely from magnesium increase, patient given information, if not better will contact the office.   Hypertension Well controlled with current medications  Monitor blood pressure at home; patient to call if consistently greater than 130/80 Continue DASH diet.   Reminder to go to the ER if any CP, SOB, nausea, dizziness, severe HA, changes vision/speech, left arm numbness and tingling and jaw pain.  Cholesterol Continue medication  Continue low cholesterol diet and exercise.  Check lipid panel.   Abnormal glucose Continue diet and exercise.  Perform daily foot/skin check, notify office of any concerning changes.  Check BMP, monitor A1C intermittently  Overweight Long discussion about weight loss, diet, and exercise Recommended diet heavy in fruits and veggies and low in animal meats, cheeses, and dairy products, appropriate calorie intake Discussed appropriate weight for height Follow up at next visit  Depression/anxiety Continue medications Lifestyle discussed: diet/exerise, sleep hygiene, stress management, hydration  COPD  Mild symptoms; not currently on treatment and doing well   Vitamin D Def/ osteoporosis prevention Continue supplementation Check Vit D level  Continue diet and meds as discussed. Further disposition pending results of labs. Discussed med's effects and SE's.   Over 30 minutes of exam, counseling, chart review, and critical decision making was performed.   Future Appointments  Date Time Provider [place]  11/23/2018 10:00 AM Unk Pinto, MD [id number] None  02/26/2019  3:00 PM Vicie Mutters, PA-C [id number] None    ----------------------------------------------------------------------------------------------------------------------  HPI 78 y.o. female  presents for 3 month follow up on hypertension, cholesterol,  abnormal glucose, COPD, depression currently in remission, weight and vitamin D deficiency.   She states she has been having abnormal stools x 3 months. Jan 29th 2020 had colonoscopy that showed diverticulitis but otherwise normal. She states her stools have been more like soft/diarrhea, "explosive" in the morning. Will have 2-3 in the morning. No urgency, no AB pain, no odor, no abnormal color. No fever, chills, weakness. She has had more gas than usual. She did increase her magnesium 3 months ago.   she has a diagnosis of depression currently in remission, and is currently on celexa 40 mg, reports symptoms continue to be well controlled on current regimen.   COPD: reports very mild symptoms,  doing well without treatment, denies SOB on exertion  BMI is Body mass index is 29.95 kg/m., she has been working on diet and exercise. Wt Readings from Last 3 Encounters:  08/21/18 180 lb (81.6 kg)  05/22/18 171 lb 9.6 oz (77.8 kg)  02/14/18 174 lb 3.2 oz (79 kg)   Her blood pressure has been controlled at home, today their BP is BP: 126/80  She does workout. She denies chest pain, shortness of breath, dizziness.   She is on cholesterol medication and denies myalgias. Her cholesterol is at goal. The cholesterol last visit was:   Lab Results  Component Value Date   CHOL 179 05/22/2018   HDL 62 05/22/2018   LDLCALC 96 05/22/2018   TRIG 114 05/22/2018   CHOLHDL 2.9 05/22/2018    She has been working on diet and exercise for abnormal glucose, and denies increased appetite, nausea, paresthesia of the feet, polydipsia, polyuria, visual disturbances and vomiting. Last A1C in the office was:  Lab Results  Component Value Date   HGBA1C 5.5 05/22/2018   Patient is on Vitamin D supplement and near goal:  Lab Results  Component Value Date   VD25OH 53 05/22/2018        Current Medications:  Current Outpatient Medications on File Prior to Visit  Medication Sig  . ALPRAZolam (XANAX) 0.5 MG tablet  Take 1/2-1 tablet 2 - 3 x /day ONLY if needed for Anxiety Attack &  limit to 5 days /week to avoid addiction  . aspirin 81 MG EC tablet Take 81 mg by mouth daily.    . cetirizine (ZYRTEC) 10 MG tablet Take 10 mg by mouth as needed.    . Cholecalciferol (VITAMIN D3) 2000 UNITS capsule Take 2,000 Units by mouth daily.    . citalopram (CELEXA) 40 MG tablet Take 1 tablet Daily for Mood  . conjugated estrogens (PREMARIN) vaginal cream Place 1 Applicatorful vaginally. Use once every other week  . diclofenac (VOLTAREN) 75 MG EC tablet TAKE 1 TABLET (75 MG TOTAL) BY MOUTH 2 (TWO) TIMES DAILY. (Patient taking differently: Take 75 mg by mouth daily. )  . diclofenac sodium (VOLTAREN) 1 % GEL Apply 2 g topically 4 (four) times daily.  . Flaxseed, Linseed, (FLAX SEED OIL) 1000 MG CAPS Take 1,000 mg by mouth daily.  . hydrochlorothiazide (HYDRODIURIL) 25 MG tablet Take 1 tablet daily for BP & Fluid  . losartan (COZAAR) 100 MG tablet Take 1 tablet Daily for BP  . Magnesium 400 MG CAPS Take 400 mg by mouth daily.  . montelukast (SINGULAIR) 10 MG tablet TAKE 1 TABLET BY MOUTH EVERY DAY  . Multiple Vitamin (MULTIVITAMIN) tablet Take 1 tablet by mouth daily.    . NON FORMULARY Calcium 1800 mg 1 tablet daily   . OVER THE COUNTER MEDICATION Otc Mucinex 1 tab daily  . simvastatin (ZOCOR) 40 MG tablet TAKE 1 TABLET (40 MG TOTAL) BY MOUTH AT BEDTIME.  Marland Kitchen tiZANidine (ZANAFLEX) 4 MG tablet Take 1 tablet (4 mg total) by mouth every 8 (eight) hours as needed for muscle spasms.  . traZODone (DESYREL) 150 MG tablet TAKE 1/2 TO 1 TABLET BY MOUTH ONE HOUR PRIOR TO BEDTIME  . vitamin C (ASCORBIC ACID) 500 MG tablet Take 500 mg by mouth daily.  . Probiotic Product (PROBIOTIC-10) CAPS Take 1 tablet by mouth daily.   No current facility-administered medications on file prior to visit.      Allergies:  Allergies  Allergen Reactions  . Ace Inhibitors     Cough  . Ampicillin     rash  . Mevacor [Lovastatin]     Elevated  LFT's  . Penicillins     REACTION: hives and  itching  . Sulfa Antibiotics     rash     Medical History:  Past Medical History:  Diagnosis Date  . Anxiety   . Dyslipidemia    on Rx x20 years  . HTN (hypertension)   . Hyperlipidemia   . Insomnia   . Morton's neuroma of left foot   . Multiple allergies   . Osteoarthritis   . Polycythemia   . PVC (premature ventricular contraction)    Family history- Reviewed and unchanged Social history- Reviewed and unchanged   Review of Systems:  Review of Systems  Constitutional: Negative.   HENT: Negative.   Eyes: Negative.   Respiratory: Negative.   Cardiovascular: Negative.   Gastrointestinal: Positive for diarrhea.  Genitourinary: Negative.   Musculoskeletal: Negative.   Skin: Negative.   Neurological: Negative.   Endo/Heme/Allergies: Negative.   Psychiatric/Behavioral: Negative.       Physical Exam: BP 126/80   Pulse  70   Temp (!) 97.2 F (36.2 C)   Wt 180 lb (81.6 kg)   SpO2 94%   BMI 29.95 kg/m  Wt Readings from Last 3 Encounters:  08/21/18 180 lb (81.6 kg)  05/22/18 171 lb 9.6 oz (77.8 kg)  02/14/18 174 lb 3.2 oz (79 kg)   General Appearance: Well nourished, in no apparent distress. Eyes: PERRLA, EOMs, conjunctiva no swelling or erythema Sinuses: No Frontal/maxillary tenderness ENT/Mouth: Ext aud canals clear, TMs without erythema, bulging. No erythema, swelling, or exudate on post pharynx.  Tonsils not swollen or erythematous. Hearing normal.  Neck: Supple, thyroid normal.  Respiratory: Respiratory effort normal, BS equal bilaterally without rales, rhonchi, wheezing or stridor.  Cardio: RRR with no MRGs. Brisk peripheral pulses without edema.  Abdomen: Soft, + BS.  Non tender, no guarding, rebound, hernias, masses. Lymphatics: Non tender without lymphadenopathy.  Musculoskeletal: Full ROM, 5/5 strength, Normal gait Skin: Warm, dry without rashes, lesions, ecchymosis.  Neuro: Cranial nerves intact. No  cerebellar symptoms.  Psych: Awake and oriented X 3, normal affect, Insight and Judgment appropriate.    Vicie Mutters, PA-C 3:18 PM [facility]

## 2018-08-21 ENCOUNTER — Other Ambulatory Visit: Payer: Self-pay

## 2018-08-21 ENCOUNTER — Ambulatory Visit: Payer: Medicare Other | Admitting: Physician Assistant

## 2018-08-21 VITALS — BP 126/80 | HR 70 | Temp 97.2°F | Wt 180.0 lb

## 2018-08-21 DIAGNOSIS — E559 Vitamin D deficiency, unspecified: Secondary | ICD-10-CM

## 2018-08-21 DIAGNOSIS — E782 Mixed hyperlipidemia: Secondary | ICD-10-CM | POA: Diagnosis not present

## 2018-08-21 DIAGNOSIS — R7309 Other abnormal glucose: Secondary | ICD-10-CM | POA: Diagnosis not present

## 2018-08-21 DIAGNOSIS — Z79899 Other long term (current) drug therapy: Secondary | ICD-10-CM

## 2018-08-21 DIAGNOSIS — I10 Essential (primary) hypertension: Secondary | ICD-10-CM

## 2018-08-21 NOTE — Patient Instructions (Signed)
Ways to prevent diarrhea with magnesium:  1) Don't take all your magnesium at the same time, have 2-3 smaller doses through out the day 2) Try taking your magnesium with high fiber meals.  3) If this does not help, take the magnesium on an empty stomach. Fiber for some people can bind the magnesium too well and prevent absorption in your gut.  4) Lastly try different types of magnesium. Most people are taking magnesium citrate, you can also try dimalate capsules which are slow release. You can also find magnesium lotions/sprays for the skin that bypass the gut. Another one that has good absorption is ReMag (pico-iconic magnesium formula), this has great cellular absorption so less of a laxative effect. You can find these type at health food stores or online.   Continue probiotic   Food Choices to Help Relieve Diarrhea, Adult When you have diarrhea, the foods you eat and your eating habits are very important. Choosing the right foods and drinks can help:  Relieve diarrhea.  Replace lost fluids and nutrients.  Prevent dehydration. What general guidelines should I follow?  Relieving diarrhea  Choose foods with less than 2 g or .07 oz. of fiber per serving.  Limit fats to less than 8 tsp (38 g or 1.34 oz.) a day.  Avoid the following: ? Foods and beverages sweetened with high-fructose corn syrup, honey, or sugar alcohols such as xylitol, sorbitol, and mannitol. ? Foods that contain a lot of fat or sugar. ? Fried, greasy, or spicy foods. ? High-fiber grains, breads, and cereals. ? Raw fruits and vegetables.  Eat foods that are rich in probiotics. These foods include dairy products such as yogurt and fermented milk products. They help increase healthy bacteria in the stomach and intestines (gastrointestinal tract, or GI tract).  If you have lactose intolerance, avoid dairy products. These may make your diarrhea worse.  Take medicine to help stop diarrhea (antidiarrheal medicine) only  as told by your health care provider. Replacing nutrients  Eat small meals or snacks every 3-4 hours.  Eat bland foods, such as white rice, toast, or baked potato, until your diarrhea starts to get better. Gradually reintroduce nutrient-rich foods as tolerated or as told by your health care provider. This includes: ? Well-cooked protein foods. ? Peeled, seeded, and soft-cooked fruits and vegetables. ? Low-fat dairy products.  Take vitamin and mineral supplements as told by your health care provider. Preventing dehydration  Start by sipping water or a special solution to prevent dehydration (oral rehydration solution, ORS). Urine that is clear or pale yellow means that you are getting enough fluid.  Try to drink at least 8-10 cups of fluid each day to help replace lost fluids.  You may add other liquids in addition to water, such as clear juice or decaffeinated sports drinks, as tolerated or as told by your health care provider.  Avoid drinks with caffeine, such as coffee, tea, or soft drinks.  Avoid alcohol. What foods are recommended?     The items listed may not be a complete list. Talk with your health care provider about what dietary choices are best for you. Grains White rice. White, [place], or pita breads (fresh or toasted), including plain rolls, buns, or bagels. White pasta. Saltine, soda, or graham crackers. Pretzels. Low-fiber cereal. Cooked cereals made with water (such as cornmeal, farina, or cream cereals). Plain muffins. Matzo. Melba toast. Zwieback. Vegetables Potatoes (without the skin). Most well-cooked and canned vegetables without skins or seeds. Tender lettuce. Fruits Apple  sauce. Fruits canned in juice. Cooked apricots, cherries, grapefruit, peaches, pears, or plums. Fresh bananas and cantaloupe. Meats and other protein foods Baked or boiled chicken. Eggs. Tofu. Fish. Seafood. Smooth nut butters. Ground or well-cooked tender beef, ham, veal, lamb, pork, or  poultry. Dairy Plain yogurt, kefir, and unsweetened liquid yogurt. Lactose-free milk, buttermilk, skim milk, or soy milk. Low-fat or nonfat hard cheese. Beverages Water. Low-calorie sports drinks. Fruit juices without pulp. Strained tomato and vegetable juices. Decaffeinated teas. Sugar-free beverages not sweetened with sugar alcohols. Oral rehydration solutions, if approved by your health care provider. Seasoning and other foods Bouillon, broth, or soups made from recommended foods. What foods are not recommended? The items listed may not be a complete list. Talk with your health care provider about what dietary choices are best for you. Grains Whole grain, whole wheat, bran, or rye breads, rolls, pastas, and crackers. Wild or brown rice. Whole grain or bran cereals. Barley. Oats and oatmeal. Corn tortillas or taco shells. Granola. Popcorn. Vegetables Raw vegetables. Fried vegetables. Cabbage, broccoli, Brussels sprouts, artichokes, baked beans, beet greens, corn, kale, legumes, peas, sweet potatoes, and yams. Potato skins. Cooked spinach and cabbage. Fruits Dried fruit, including raisins and dates. Raw fruits. Stewed or dried prunes. Canned fruits with syrup. Meat and other protein foods Fried or fatty meats. Deli meats. Chunky nut butters. Nuts and seeds. Beans and lentils. Berniece Salines. Hot dogs. Sausage. Dairy High-fat cheeses. Whole milk, chocolate milk, and beverages made with milk, such as milk shakes. Half-and-half. Cream. sour cream. Ice cream. Beverages Caffeinated beverages (such as coffee, tea, soda, or energy drinks). Alcoholic beverages. Fruit juices with pulp. Prune juice. Soft drinks sweetened with high-fructose corn syrup or sugar alcohols. High-calorie sports drinks. Fats and oils Butter. Cream sauces. Margarine. Salad oils. Plain salad dressings. Olives. Avocados. Mayonnaise. Sweets and desserts Sweet rolls, doughnuts, and sweet breads. Sugar-free desserts sweetened with sugar  alcohols such as xylitol and sorbitol. Seasoning and other foods Honey. Hot sauce. Chili powder. Gravy. Cream-based or milk-based soups. Pancakes and waffles. Summary  When you have diarrhea, the foods you eat and your eating habits are very important.  Make sure you get at least 8-10 cups of fluid each day, or enough to keep your urine clear or pale yellow.  Eat bland foods and gradually reintroduce healthy, nutrient-rich foods as tolerated, or as told by your health care provider.  Avoid high-fiber, fried, greasy, or spicy foods. This information is not intended to replace advice given to you by your health care provider. Make sure you discuss any questions you have with your health care provider. Document Released: 04/17/2003 Document Revised: 05/18/2018 Document Reviewed: 01/23/2016 Elsevier Patient Education  2020 [employer].

## 2018-08-22 LAB — CBC WITH DIFFERENTIAL/PLATELET
Absolute Monocytes: 632 cells/uL (ref 200–950)
Basophils Absolute: 40 cells/uL (ref 0–200)
Basophils Relative: 0.5 %
Eosinophils Absolute: 232 cells/uL (ref 15–500)
Eosinophils Relative: 2.9 %
HCT: 36.1 % (ref 35.0–45.0)
Hemoglobin: 12.4 g/dL (ref 11.7–15.5)
Lymphs Abs: 1288 cells/uL (ref 850–3900)
MCH: 32.6 pg (ref 27.0–33.0)
MCHC: 34.3 g/dL (ref 32.0–36.0)
MCV: 95 fL (ref 80.0–100.0)
MPV: 9.6 fL (ref 7.5–12.5)
Monocytes Relative: 7.9 %
Neutro Abs: 5808 cells/uL (ref 1500–7800)
Neutrophils Relative %: 72.6 %
Platelets: 268 10*3/uL (ref 140–400)
RBC: 3.8 10*6/uL (ref 3.80–5.10)
RDW: 12.3 % (ref 11.0–15.0)
Total Lymphocyte: 16.1 %
WBC: 8 10*3/uL (ref 3.8–10.8)

## 2018-08-22 LAB — COMPLETE METABOLIC PANEL WITH GFR
AG Ratio: 1.7 (calc) (ref 1.0–2.5)
ALT: 44 U/L — ABNORMAL HIGH (ref 6–29)
AST: 47 U/L — ABNORMAL HIGH (ref 10–35)
Albumin: 3.8 g/dL (ref 3.6–5.1)
Alkaline phosphatase (APISO): 109 U/L (ref 37–153)
BUN: 17 mg/dL (ref 7–25)
CO2: 34 mmol/L — ABNORMAL HIGH (ref 20–32)
Calcium: 10 mg/dL (ref 8.6–10.4)
Chloride: 101 mmol/L (ref 98–110)
Creat: 0.82 mg/dL (ref 0.60–0.93)
GFR, Est African American: 80 mL/min/{1.73_m2} (ref >=60)
GFR, Est Non African American: 69 mL/min/{1.73_m2} (ref >=60)
Globulin: 2.2 g/dL (calc) (ref 1.9–3.7)
Glucose, Bld: 92 mg/dL (ref 65–99)
Potassium: 3.7 mmol/L (ref 3.5–5.3)
Sodium: 139 mmol/L (ref 135–146)
Total Bilirubin: 0.4 mg/dL (ref 0.2–1.2)
Total Protein: 6 g/dL — ABNORMAL LOW (ref 6.1–8.1)

## 2018-08-22 LAB — LIPID PANEL
Cholesterol: 166 mg/dL (ref <200)
HDL: 57 mg/dL (ref >=50)
LDL Cholesterol (Calc): 82 mg/dL (calc)
Non-HDL Cholesterol (Calc): 109 mg/dL (calc) (ref <130)
Total CHOL/HDL Ratio: 2.9 (calc) (ref <5.0)
Triglycerides: 171 mg/dL — ABNORMAL HIGH (ref <150)

## 2018-08-22 LAB — VITAMIN D 25 HYDROXY (VIT D DEFICIENCY, FRACTURES): Vit D, 25-Hydroxy: 62 ng/mL (ref 30–100)

## 2018-08-22 LAB — HEMOGLOBIN A1C
Hgb A1c MFr Bld: 5.4 % of total Hgb (ref <5.7)
Mean Plasma Glucose: 108 (calc)
eAG (mmol/L): 6 (calc)

## 2018-08-22 LAB — TSH: TSH: 2.21 mIU/L (ref 0.40–4.50)

## 2018-08-22 LAB — MAGNESIUM: Magnesium: 2.1 mg/dL (ref 1.5–2.5)

## 2018-09-11 ENCOUNTER — Other Ambulatory Visit: Payer: Self-pay | Admitting: Adult Health Nurse Practitioner

## 2018-09-17 ENCOUNTER — Other Ambulatory Visit: Payer: Self-pay | Admitting: Adult Health

## 2018-09-25 DIAGNOSIS — R3121 Asymptomatic microscopic hematuria: Secondary | ICD-10-CM | POA: Diagnosis not present

## 2018-09-25 DIAGNOSIS — N3000 Acute cystitis without hematuria: Secondary | ICD-10-CM | POA: Diagnosis not present

## 2018-09-25 DIAGNOSIS — N952 Postmenopausal atrophic vaginitis: Secondary | ICD-10-CM | POA: Diagnosis not present

## 2018-10-09 DIAGNOSIS — Z961 Presence of intraocular lens: Secondary | ICD-10-CM | POA: Diagnosis not present

## 2018-10-09 DIAGNOSIS — H52203 Unspecified astigmatism, bilateral: Secondary | ICD-10-CM | POA: Diagnosis not present

## 2018-11-06 ENCOUNTER — Other Ambulatory Visit: Payer: Self-pay | Admitting: Internal Medicine

## 2018-11-06 DIAGNOSIS — F419 Anxiety disorder, unspecified: Secondary | ICD-10-CM

## 2018-11-22 ENCOUNTER — Encounter: Payer: Self-pay | Admitting: Internal Medicine

## 2018-11-22 NOTE — Patient Instructions (Signed)

## 2018-11-22 NOTE — Progress Notes (Signed)
Annual Screening/Preventative Visit & Comprehensive Evaluation &  Examination     This very nice 78 y.o. WWF presents for a Screening /Preventative Visit & comprehensive evaluation and management of multiple medical co-morbidities.  Patient has been followed for HTN, HLD, Prediabetes  and Vitamin D Deficiency.      HTN predates circa 1996. Patient's BP has been controlled at home and patient denies any cardiac symptoms as chest pain, palpitations, shortness of breath, dizziness or ankle swelling. Today's BP is at goal -  114/78.      Patient's hyperlipidemia is controlled with diet and medications. Patient denies myalgias or other medication SE's. Last lipids were at goal with a slightly elevated Trig's:  Lab Results  Component Value Date   CHOL 166 08/21/2018   HDL 57 08/21/2018   LDLCALC 82 08/21/2018   TRIG 171 (H) 08/21/2018   CHOLHDL 2.9 08/21/2018          Patient has hx/o prediabetes (A1c 5.8% / 2015 & 5.3% / 2017) and patient denies reactive hypoglycemic symptoms, visual blurring, diabetic polys or paresthesias. Last A1c was Normal & at goal:  Lab Results  Component Value Date   HGBA1C 5.4 08/21/2018           Finally, patient has history of Vitamin D Deficiency and last Vitamin D was at goal:  Lab Results  Component Value Date   VD25OH 62 08/21/2018   Current Outpatient Medications on File Prior to Visit  Medication Sig  . ALPRAZolam (XANAX) 0.5 MG tablet TAKE 1/2 TO 1 TABLET 2 - 3 TIMES PER DAY ONLY AS NEEDED FOR ANXIETY. LIMIT TO 5 DAYS PER WEEK.  Marland Kitchen aspirin 81 MG EC tablet Take 81 mg by mouth daily.    . cetirizine (ZYRTEC) 10 MG tablet Take 10 mg by mouth as needed.    . Cholecalciferol (VITAMIN D3) 2000 UNITS capsule Take 2,000 Units by mouth daily.    . citalopram (CELEXA) 40 MG tablet TAKE 1 TABLET BY MOUTH DAILY FOR MOOD  . conjugated estrogens (PREMARIN) vaginal cream Place 1 Applicatorful vaginally. Use once every week  . diclofenac (VOLTAREN) 75 MG EC  tablet TAKE 1 TABLET (75 MG TOTAL) BY MOUTH 2 (TWO) TIMES DAILY. (Patient taking differently: Take 75 mg by mouth daily. )  . diclofenac sodium (VOLTAREN) 1 % GEL Apply 2 g topically 4 (four) times daily.  . Flaxseed, Linseed, (FLAX SEED OIL) 1000 MG CAPS Take 1,000 mg by mouth daily.  . hydrochlorothiazide (HYDRODIURIL) 25 MG tablet Take 1 tablet daily for BP & Fluid  . losartan (COZAAR) 100 MG tablet Take 1 tablet Daily for BP  . Magnesium 400 MG CAPS Take 400 mg by mouth daily.  . montelukast (SINGULAIR) 10 MG tablet TAKE 1 TABLET BY MOUTH EVERY DAY  . Multiple Vitamin (MULTIVITAMIN) tablet Take 1 tablet by mouth daily.    . NON FORMULARY Calcium 1800 mg 1 tablet daily   . OVER THE COUNTER MEDICATION Otc Mucinex 1 tab daily  . simvastatin (ZOCOR) 40 MG tablet TAKE 1 TABLET BY MOUTH EVERYDAY AT BEDTIME  . tiZANidine (ZANAFLEX) 4 MG tablet Take 1 tablet (4 mg total) by mouth every 8 (eight) hours as needed for muscle spasms.  . traZODone (DESYREL) 150 MG tablet Take 1/2 to 1 tablet 1 hour before Bedtime if needed for Sleep  . vitamin C (ASCORBIC ACID) 500 MG tablet Take 500 mg by mouth daily.   No current facility-administered medications on file prior to visit.  Allergies  Allergen Reactions  . Ace Inhibitors     Cough  . Ampicillin     rash  . Mevacor [Lovastatin]     Elevated LFT's  . Penicillins     REACTION: hives and  itching  . Sulfa Antibiotics     rash   Past Medical History:  Diagnosis Date  . Anxiety   . Dyslipidemia    on Rx x20 years  . HTN (hypertension)   . Hyperlipidemia   . Insomnia   . Morton's neuroma of left foot   . Multiple allergies   . Osteoarthritis   . Polycythemia   . PVC (premature ventricular contraction)    Health Maintenance  Topic Date Due  . MAMMOGRAM  12/20/2018  . TETANUS/TDAP  08/02/2023  . INFLUENZA VACCINE  Completed  . DEXA SCAN  Completed  . PNA vac Low Risk Adult  Completed   Immunization History  Administered Date(s)  Administered  . Influenza Split 11/15/2011, 11/28/2012, 10/31/2013, 01/07/2014  . Influenza, High Dose Seasonal PF 11/27/2014, 11/28/2015, 10/30/2017  . Influenza-Unspecified 09/29/2016, 10/01/2018  . Pneumococcal Conjugate-13 10/02/2013  . Pneumococcal Polysaccharide-23 08/03/2017  . Tdap 08/01/2013  . Zoster 05/25/2011  . Zoster Recombinat (Shingrix) 11/10/2016   Last Colon -  05/15/2012 - Dr Watt Climes - Recc no f/u due to Age  Last MGM - 12/20/2027  Past Surgical History:  Procedure Laterality Date  . ABDOMINAL HYSTERECTOMY    . CATARACT EXTRACTION Right 2019   Dr. Ellie Lunch   . KNEE ARTHROSCOPY     R  . TONSILLECTOMY    . VESICOVAGINAL FISTULA CLOSURE W/ TAH     Family History  Problem Relation Age of Onset  . Osteoarthritis Mother   . Heart attack Mother   . Heart disease Mother   . Cancer Father        lung  . Osteoarthritis Father   . Hyperlipidemia Brother   . Diabetes Brother    Social History   Tobacco Use  . Smoking status: Never Smoker  . Smokeless tobacco: Never Used  . Tobacco comment: no tobacco   Substance Use Topics  . Alcohol use: Yes    Comment: rare  . Drug use: Not on file    ROS Constitutional: Denies fever, chills, weight loss/gain, headaches, insomnia,  night sweats, and change in appetite. Does c/o fatigue. Eyes: Denies redness, blurred vision, diplopia, discharge, itchy, watery eyes.  ENT: Denies discharge, congestion, post nasal drip, epistaxis, sore throat, earache, hearing loss, dental pain, Tinnitus, Vertigo, Sinus pain, snoring.  Cardio: Denies chest pain, palpitations, irregular heartbeat, syncope, dyspnea, diaphoresis, orthopnea, PND, claudication, edema Respiratory: denies cough, dyspnea, DOE, pleurisy, hoarseness, laryngitis, wheezing.  Gastrointestinal: Denies dysphagia, heartburn, reflux, water brash, pain, cramps, nausea, vomiting, bloating, diarrhea, constipation, hematemesis, melena, hematochezia, jaundice, hemorrhoids  Genitourinary: Denies dysuria, frequency, urgency, nocturia, hesitancy, discharge, hematuria, flank pain Breast: Breast lumps, nipple discharge, bleeding.  Musculoskeletal: Denies arthralgia, myalgia, stiffness, Jt. Swelling, pain, limp, and strain/sprain. Denies falls. Skin: Denies puritis, rash, hives, warts, acne, eczema, changing in skin lesion Neuro: No weakness, tremor, incoordination, spasms, paresthesia, pain Psychiatric: Denies confusion, memory loss, sensory loss. Denies Depression. Endocrine: Denies change in weight, skin, hair change, nocturia, and paresthesia, diabetic polys, visual blurring, hyper / hypo glycemic episodes.  Heme/Lymph: No excessive bleeding, bruising, enlarged lymph nodes.  Physical Exam  BP 114/78   Pulse 64   Temp (!) 97 F (36.1 C)   Resp 16   Ht 5\' 5"  (1.651 m)  Wt 173 lb 3.2 oz (78.6 kg)   BMI 28.82 kg/m   General Appearance: Well nourished, well groomed and in no apparent distress.  Eyes: PERRLA, EOMs, conjunctiva no swelling or erythema, normal fundi and vessels. Sinuses: No frontal/maxillary tenderness ENT/Mouth: EACs patent / TMs  nl. Nares clear without erythema, swelling, mucoid exudates. Oral hygiene is good. No erythema, swelling, or exudate. Tongue normal, non-obstructing. Tonsils not swollen or erythematous. Hearing normal.  Neck: Supple, thyroid not palpable. No bruits, nodes or JVD. Respiratory: Respiratory effort normal.  BS equal and clear bilateral without rales, rhonci, wheezing or stridor. Cardio: Heart sounds are normal with regular rate and rhythm and no murmurs, rubs or gallops. Peripheral pulses are normal and equal bilaterally without edema. No aortic or femoral bruits. Chest: symmetric with normal excursions and percussion. Breasts: Symmetric, without lumps, nipple discharge, retractions, or fibrocystic changes.  Abdomen: Flat, soft with bowel sounds active. Nontender, no guarding, rebound, hernias, masses, or organomegaly.   Lymphatics: Non tender without lymphadenopathy.  Genitourinary:  Musculoskeletal: Full ROM all peripheral extremities, joint stability, 5/5 strength, and normal gait. Skin: Warm and dry without rashes, lesions, cyanosis, clubbing or  ecchymosis.  Neuro: Cranial nerves intact, reflexes equal bilaterally. Normal muscle tone, no cerebellar symptoms. Sensation intact.  Pysch: Alert and oriented X 3, normal affect, Insight and Judgment appropriate.   Assessment and Plan  1. Annual Preventative Screening Examination  2. Essential hypertension  - EKG 12-Lead - Urinalysis, Routine w reflex microscopic - Microalbumin / creatinine urine ratio - CBC with Differential/Platelet - COMPLETE METABOLIC PANEL WITH GFR - Magnesium - TSH  3. Hyperlipidemia, mixed  - EKG 12-Lead - Lipid panel - TSH  4. Abnormal glucose  - EKG 12-Lead - Hemoglobin A1c - Insulin, random  5. Vitamin D deficiency  - VITAMIN D 25 Hydroxyl  6. Prediabetes  - EKG 12-Lead - Hemoglobin A1c - Insulin, random  7. Stage 3b chronic kidney disease  - Urinalysis, Routine w reflex microscopic - Microalbumin / creatinine urine ratio  8. Encounter for colorectal cancer screening  - POC Hemoccult Bld/Stl  9. Screening for ischemic heart disease  - EKG 12-Lead  10. FHx: heart disease  - EKG 12-Lead  11. Medication management  - Urinalysis, Routine w reflex microscopic - Microalbumin / creatinine urine ratio - CBC with Differential/Platelet - COMPLETE METABOLIC PANEL WITH GFR - Magnesium - Lipid panel - TSH - Hemoglobin A1c - Insulin, random - VITAMIN D 25 Hydroxyl        Patient was counseled in prudent diet to achieve/maintain BMI less than 25 for weight control, BP monitoring, regular exercise and medications. Discussed med's effects and SE's. Screening labs and tests as requested with regular follow-up as recommended. Over 40 minutes of exam, counseling, chart review and high complex critical  decision making was performed.   Kirtland Bouchard, MD

## 2018-11-23 ENCOUNTER — Ambulatory Visit (INDEPENDENT_AMBULATORY_CARE_PROVIDER_SITE_OTHER): Payer: Medicare Other | Admitting: Internal Medicine

## 2018-11-23 ENCOUNTER — Other Ambulatory Visit: Payer: Self-pay

## 2018-11-23 VITALS — BP 114/78 | HR 64 | Temp 97.0°F | Resp 16 | Ht 65.0 in | Wt 173.2 lb

## 2018-11-23 DIAGNOSIS — Z1211 Encounter for screening for malignant neoplasm of colon: Secondary | ICD-10-CM

## 2018-11-23 DIAGNOSIS — I10 Essential (primary) hypertension: Secondary | ICD-10-CM | POA: Diagnosis not present

## 2018-11-23 DIAGNOSIS — R7303 Prediabetes: Secondary | ICD-10-CM | POA: Diagnosis not present

## 2018-11-23 DIAGNOSIS — Z1212 Encounter for screening for malignant neoplasm of rectum: Secondary | ICD-10-CM

## 2018-11-23 DIAGNOSIS — R7309 Other abnormal glucose: Secondary | ICD-10-CM | POA: Diagnosis not present

## 2018-11-23 DIAGNOSIS — Z8249 Family history of ischemic heart disease and other diseases of the circulatory system: Secondary | ICD-10-CM

## 2018-11-23 DIAGNOSIS — Z79899 Other long term (current) drug therapy: Secondary | ICD-10-CM

## 2018-11-23 DIAGNOSIS — E782 Mixed hyperlipidemia: Secondary | ICD-10-CM | POA: Diagnosis not present

## 2018-11-23 DIAGNOSIS — Z0000 Encounter for general adult medical examination without abnormal findings: Secondary | ICD-10-CM | POA: Diagnosis not present

## 2018-11-23 DIAGNOSIS — E559 Vitamin D deficiency, unspecified: Secondary | ICD-10-CM | POA: Diagnosis not present

## 2018-11-23 DIAGNOSIS — Z136 Encounter for screening for cardiovascular disorders: Secondary | ICD-10-CM | POA: Diagnosis not present

## 2018-11-23 DIAGNOSIS — Z0001 Encounter for general adult medical examination with abnormal findings: Secondary | ICD-10-CM

## 2018-11-24 LAB — URINALYSIS, ROUTINE W REFLEX MICROSCOPIC
Bacteria, UA: NONE SEEN /HPF
Bilirubin Urine: NEGATIVE
Glucose, UA: NEGATIVE
Hgb urine dipstick: NEGATIVE
Hyaline Cast: NONE SEEN /LPF
Ketones, ur: NEGATIVE
Nitrite: NEGATIVE
Protein, ur: NEGATIVE
Specific Gravity, Urine: 1.014 (ref 1.001–1.03)
pH: 7.5 (ref 5.0–8.0)

## 2018-11-24 LAB — COMPLETE METABOLIC PANEL WITH GFR
AG Ratio: 1.9 (calc) (ref 1.0–2.5)
ALT: 33 U/L — ABNORMAL HIGH (ref 6–29)
AST: 25 U/L (ref 10–35)
Albumin: 4.1 g/dL (ref 3.6–5.1)
Alkaline phosphatase (APISO): 122 U/L (ref 37–153)
BUN: 18 mg/dL (ref 7–25)
CO2: 28 mmol/L (ref 20–32)
Calcium: 10.1 mg/dL (ref 8.6–10.4)
Chloride: 99 mmol/L (ref 98–110)
Creat: 0.89 mg/dL (ref 0.60–0.93)
GFR, Est African American: 72 mL/min/{1.73_m2} (ref >=60)
GFR, Est Non African American: 62 mL/min/{1.73_m2} (ref >=60)
Globulin: 2.2 g/dL (calc) (ref 1.9–3.7)
Glucose, Bld: 97 mg/dL (ref 65–99)
Potassium: 3.7 mmol/L (ref 3.5–5.3)
Sodium: 137 mmol/L (ref 135–146)
Total Bilirubin: 0.5 mg/dL (ref 0.2–1.2)
Total Protein: 6.3 g/dL (ref 6.1–8.1)

## 2018-11-24 LAB — LIPID PANEL
Cholesterol: 164 mg/dL (ref <200)
HDL: 45 mg/dL — ABNORMAL LOW (ref >=50)
LDL Cholesterol (Calc): 96 mg/dL (calc)
Non-HDL Cholesterol (Calc): 119 mg/dL (calc) (ref <130)
Total CHOL/HDL Ratio: 3.6 (calc) (ref <5.0)
Triglycerides: 132 mg/dL (ref <150)

## 2018-11-24 LAB — CBC WITH DIFFERENTIAL/PLATELET
Absolute Monocytes: 483 cells/uL (ref 200–950)
Basophils Absolute: 63 cells/uL (ref 0–200)
Basophils Relative: 0.9 %
Eosinophils Absolute: 287 cells/uL (ref 15–500)
Eosinophils Relative: 4.1 %
HCT: 38.9 % (ref 35.0–45.0)
Hemoglobin: 13.1 g/dL (ref 11.7–15.5)
Lymphs Abs: 1113 cells/uL (ref 850–3900)
MCH: 32.3 pg (ref 27.0–33.0)
MCHC: 33.7 g/dL (ref 32.0–36.0)
MCV: 95.8 fL (ref 80.0–100.0)
MPV: 10.1 fL (ref 7.5–12.5)
Monocytes Relative: 6.9 %
Neutro Abs: 5054 cells/uL (ref 1500–7800)
Neutrophils Relative %: 72.2 %
Platelets: 268 10*3/uL (ref 140–400)
RBC: 4.06 10*6/uL (ref 3.80–5.10)
RDW: 12.3 % (ref 11.0–15.0)
Total Lymphocyte: 15.9 %
WBC: 7 10*3/uL (ref 3.8–10.8)

## 2018-11-24 LAB — HEMOGLOBIN A1C
Hgb A1c MFr Bld: 5.2 % of total Hgb (ref <5.7)
Mean Plasma Glucose: 103 (calc)
eAG (mmol/L): 5.7 (calc)

## 2018-11-24 LAB — MICROALBUMIN / CREATININE URINE RATIO
Creatinine, Urine: 78 mg/dL (ref 20–275)
Microalb Creat Ratio: 18 mcg/mg creat (ref <30)
Microalb, Ur: 1.4 mg/dL

## 2018-11-24 LAB — INSULIN, RANDOM: Insulin: 7.3 u[IU]/mL

## 2018-11-24 LAB — MAGNESIUM: Magnesium: 1.9 mg/dL (ref 1.5–2.5)

## 2018-11-24 LAB — TSH: TSH: 3.65 mIU/L (ref 0.40–4.50)

## 2018-11-24 LAB — VITAMIN D 25 HYDROXY (VIT D DEFICIENCY, FRACTURES): Vit D, 25-Hydroxy: 64 ng/mL (ref 30–100)

## 2018-12-21 DIAGNOSIS — Z1231 Encounter for screening mammogram for malignant neoplasm of breast: Secondary | ICD-10-CM | POA: Diagnosis not present

## 2018-12-21 LAB — HM MAMMOGRAPHY

## 2018-12-27 ENCOUNTER — Encounter: Payer: Self-pay | Admitting: *Deleted

## 2019-01-06 ENCOUNTER — Other Ambulatory Visit (INDEPENDENT_AMBULATORY_CARE_PROVIDER_SITE_OTHER): Payer: Self-pay | Admitting: Orthopaedic Surgery

## 2019-01-06 ENCOUNTER — Other Ambulatory Visit: Payer: Self-pay | Admitting: Internal Medicine

## 2019-01-06 DIAGNOSIS — I10 Essential (primary) hypertension: Secondary | ICD-10-CM

## 2019-01-24 ENCOUNTER — Other Ambulatory Visit: Payer: Self-pay

## 2019-01-24 DIAGNOSIS — Z1211 Encounter for screening for malignant neoplasm of colon: Secondary | ICD-10-CM

## 2019-01-24 LAB — POC HEMOCCULT BLD/STL (HOME/3-CARD/SCREEN)
Card #2 Fecal Occult Blod, POC: NEGATIVE
Card #3 Fecal Occult Blood, POC: NEGATIVE
Fecal Occult Blood, POC: NEGATIVE

## 2019-01-29 DIAGNOSIS — Z1212 Encounter for screening for malignant neoplasm of rectum: Secondary | ICD-10-CM | POA: Diagnosis not present

## 2019-01-29 DIAGNOSIS — Z1211 Encounter for screening for malignant neoplasm of colon: Secondary | ICD-10-CM | POA: Diagnosis not present

## 2019-02-06 ENCOUNTER — Other Ambulatory Visit: Payer: Self-pay | Admitting: Adult Health Nurse Practitioner

## 2019-02-10 ENCOUNTER — Other Ambulatory Visit (INDEPENDENT_AMBULATORY_CARE_PROVIDER_SITE_OTHER): Payer: Self-pay | Admitting: Physician Assistant

## 2019-02-26 ENCOUNTER — Other Ambulatory Visit: Payer: Self-pay

## 2019-02-26 ENCOUNTER — Ambulatory Visit: Payer: Medicare Other | Admitting: Physician Assistant

## 2019-02-26 ENCOUNTER — Encounter: Payer: Self-pay | Admitting: Physician Assistant

## 2019-02-26 VITALS — BP 130/80 | HR 63 | Temp 97.4°F | Ht 65.0 in | Wt 173.0 lb

## 2019-02-26 DIAGNOSIS — N1832 Chronic kidney disease, stage 3b: Secondary | ICD-10-CM

## 2019-02-26 DIAGNOSIS — R6889 Other general symptoms and signs: Secondary | ICD-10-CM

## 2019-02-26 DIAGNOSIS — I10 Essential (primary) hypertension: Secondary | ICD-10-CM

## 2019-02-26 DIAGNOSIS — F325 Major depressive disorder, single episode, in full remission: Secondary | ICD-10-CM

## 2019-02-26 DIAGNOSIS — M1990 Unspecified osteoarthritis, unspecified site: Secondary | ICD-10-CM

## 2019-02-26 DIAGNOSIS — Z0001 Encounter for general adult medical examination with abnormal findings: Secondary | ICD-10-CM

## 2019-02-26 DIAGNOSIS — E559 Vitamin D deficiency, unspecified: Secondary | ICD-10-CM

## 2019-02-26 DIAGNOSIS — Z79899 Other long term (current) drug therapy: Secondary | ICD-10-CM

## 2019-02-26 DIAGNOSIS — F419 Anxiety disorder, unspecified: Secondary | ICD-10-CM

## 2019-02-26 DIAGNOSIS — G4700 Insomnia, unspecified: Secondary | ICD-10-CM

## 2019-02-26 DIAGNOSIS — M8580 Other specified disorders of bone density and structure, unspecified site: Secondary | ICD-10-CM

## 2019-02-26 DIAGNOSIS — E663 Overweight: Secondary | ICD-10-CM

## 2019-02-26 DIAGNOSIS — Z0000 Encounter for general adult medical examination without abnormal findings: Secondary | ICD-10-CM

## 2019-02-26 DIAGNOSIS — E782 Mixed hyperlipidemia: Secondary | ICD-10-CM | POA: Diagnosis not present

## 2019-02-26 DIAGNOSIS — J449 Chronic obstructive pulmonary disease, unspecified: Secondary | ICD-10-CM | POA: Diagnosis not present

## 2019-02-26 DIAGNOSIS — R7309 Other abnormal glucose: Secondary | ICD-10-CM

## 2019-02-26 NOTE — Progress Notes (Signed)
Patient ID: Kelsey Lopez, female   DOB: 1940-03-06, 80 y.o.   MRN: [id number] MEDICARE ANNUAL WELLNESS VISIT AND 3 month  Assessment:    Encounter for annual medicare wellness visit Will try to get with [facility] in Nov Repeat in 1 year  Essential hypertension - continue medications, DASH diet, exercise and monitor at home. Call if greater than 130/80.  - CMP/GFR - TSH   Other abnormal glucose Discussed general issues about diabetes pathophysiology and management., Educational material distributed., Suggested low cholesterol diet., Encouraged aerobic exercise., Discussed foot care., Reminded to get yearly retinal exam.  Mixed hyperlipidemia -continue medications, check lipids, decrease fatty foods, increase activity.  - Lipid panel   Depression/ Anxiety Well managed by current regimen; continue medications Stress management techniques discussed, increase water, good sleep hygiene discussed, increase exercise, and increase veggies.   Medication management - Magnesium   Vitamin D deficiency - Vit D  25 hydroxy (rtn osteoporosis monitoring)  Osteoarthritis, unspecified osteoarthritis type, unspecified site RICE, NSAIDS, followed by Dr. Erlinda Hong  OVERWEIGHT Obesity with co morbidities- long discussion about weight loss, diet, and exercise  COPD (chronic obstructive pulmonary disease) with chronic bronchitis  continue meds.    Insomnia Insomnia- good sleep hygiene discussed, increase day time activity, try melatonin or benadryl if this does not help we will call in sleep medication.  - continue trazodone  Stage 3 CKD Increase fluids, avoid NSAIDS, monitor sugars, will monitor   Future Appointments  Date Time Provider [place]  05/30/2019 11:30 AM Unk Pinto, MD [facility] None  12/26/2019 10:00 AM Unk Pinto, MD [facility] None      Plan:   During the course of the visit the patient was educated and counseled about appropriate screening and preventive  services including:    Pneumococcal vaccine   Influenza vaccine  Td vaccine  Screening electrocardiogram  Screening mammography  Bone densitometry screening  Colorectal cancer screening  Diabetes screening  Glaucoma screening  Nutrition counseling   Advanced directives: given information/requested   Subjective:   [name] is a 79 y.o. female who presents for Medicare Annual Wellness Visit and 3 month follow up for HTN, chol, and preDM.     She has COPD due to being former smoker.  She is on celexa for anxiety, started after her husband passed 9 years ago and think it is still helping, she is on xanax PRN (takes 1/2 tab once daily) and she is on trazodone.   Following with Dr. Erlinda Hong for knee pain, bone on bone, doesn't want surgery, PT helped and doing well with occasional injections.  BMI is Body mass index is 28.79 kg/m., she has been working on diet and exercise. Wt Readings from Last 3 Encounters:  02/26/19 173 lb (78.5 kg)  11/23/18 173 lb 3.2 oz (78.6 kg)  08/21/18 180 lb (81.6 kg)   Her blood pressure has been controlled at home, today their BP is BP: 130/80 She does workout. She denies chest pain, shortness of breath, dizziness.   She is on cholesterol medication and denies myalgias. Her cholesterol is at goal. The cholesterol last visit was:   Lab Results  Component Value Date   CHOL 164 11/23/2018   HDL 45 (L) 11/23/2018   LDLCALC 96 11/23/2018   TRIG 132 11/23/2018   CHOLHDL 3.6 11/23/2018   Last A1C in the office was:  Lab Results  Component Value Date   HGBA1C 5.2 11/23/2018   Patient is on Vitamin D supplement, 2000 IU.  Lab Results  Component Value Date   VD25OH 64 11/23/2018         Medication Review   Current Outpatient Medications (Cardiovascular):  .  hydrochlorothiazide (HYDRODIURIL) 25 MG tablet, Take 1 tablet daily for BP & Fluid .  losartan (COZAAR) 100 MG tablet, Take 1 tablet Daily for BP .  simvastatin (ZOCOR) 40  MG tablet, TAKE 1 TABLET BY MOUTH EVERYDAY AT BEDTIME  Current Outpatient Medications (Respiratory):  .  cetirizine (ZYRTEC) 10 MG tablet, Take 10 mg by mouth as needed.   .  montelukast (SINGULAIR) 10 MG tablet, TAKE 1 TABLET BY MOUTH EVERY DAY  Current Outpatient Medications (Analgesics):  .  aspirin 81 MG EC tablet, Take 81 mg by mouth daily.   .  diclofenac (VOLTAREN) 75 MG EC tablet, Take 1 tablet (75 mg total) by mouth daily.   Current Outpatient Medications (Other):  Marland Kitchen  ALPRAZolam (XANAX) 0.5 MG tablet, TAKE 1/2 TO 1 TABLET 2 - 3 TIMES PER DAY ONLY AS NEEDED FOR ANXIETY. LIMIT TO 5 DAYS PER WEEK. .  Cholecalciferol (VITAMIN D3) 2000 UNITS capsule, Take 2,000 Units by mouth daily.   .  citalopram (CELEXA) 40 MG tablet, TAKE 1 TABLET BY MOUTH DAILY FOR MOOD .  conjugated estrogens (PREMARIN) vaginal cream, Place 1 Applicatorful vaginally. Use once every week .  diclofenac sodium (VOLTAREN) 1 % GEL, Apply 2 g topically 4 (four) times daily. .  Flaxseed, Linseed, (FLAX SEED OIL) 1000 MG CAPS, Take 1,000 mg by mouth daily. .  Magnesium 400 MG CAPS, Take 400 mg by mouth daily. .  Multiple Vitamin (MULTIVITAMIN) tablet, Take 1 tablet by mouth daily.   .  NON FORMULARY, Calcium 1800 mg 1 tablet daily  .  OVER THE COUNTER MEDICATION, Otc Mucinex 1 tab daily .  tiZANidine (ZANAFLEX) 4 MG tablet, Take 1 tablet (4 mg total) by mouth every 8 (eight) hours as needed for muscle spasms. .  traZODone (DESYREL) 150 MG tablet, Take 1/2 to 1 tablet 1 hour before Bedtime if needed for Sleep .  vitamin C (ASCORBIC ACID) 500 MG tablet, Take 500 mg by mouth daily.  Current Problems (verified) Patient Active Problem List   Diagnosis Date Noted  . Stage 3 chronic kidney disease 01/14/2017  . Depression, major, in remission ([place]) 11/27/2014  . Overweight (BMI 25.0-29.9) 04/10/2014  . Insomnia 04/10/2014  . Vitamin D deficiency 01/07/2014  . Abnormal glucose 01/03/2014  . Medication management  01/03/2014  . Anxiety   . Morton's neuroma of left foot   . Osteoarthritis   . Hyperlipidemia, mixed 06/05/2009  . Essential hypertension 06/05/2009    Immunization History  Administered Date(s) Administered  . Influenza Split 11/15/2011, 11/28/2012, 10/31/2013, 01/07/2014  . Influenza, High Dose Seasonal PF 11/27/2014, 11/28/2015, 10/30/2017  . Influenza-Unspecified 09/29/2016, 10/01/2018  . Pneumococcal Conjugate-13 10/02/2013  . Pneumococcal Polysaccharide-23 08/03/2017  . Tdap 08/01/2013  . Zoster 05/25/2011  . Zoster Recombinat (Shingrix) 11/10/2016    Preventative care: Last colonoscopy: 03/08/2018 Last mammogram: 12/2018 Last pap smear/pelvic exam: 2007  DEXA:07/19/13 osteopenia DUE ECHO: 07/14/09 CXR 08/2014  Prior vaccinations: TDAP: 2015  Influenza: 2020 Pneumococcal: 2004, 2019- given today Prevnar 13: 2015 Shingles/Zostavax:  2019 Shingrix 11/2016 at [facility] GOT BOTH per patient  Dr. Ellie Lunch, (Eye), last visit 09/2018 Woodfin Ganja, (Dentist), 2020, goes q36m  Patient Care Team: Unk Pinto, MD as PCP - General (Internal Medicine) Druscilla Brownie, MD as Consulting Physician (Dermatology) Clarene Essex, MD as Consulting Physician (Gastroenterology) Fay Records, MD as  Consulting Physician (Cardiology) Leandrew Koyanagi, MD as Attending Physician (Orthopedic Surgery)   Allergies Allergies  Allergen Reactions  . Ace Inhibitors     Cough  . Ampicillin     rash  . Mevacor [Lovastatin]     Elevated LFT's  . Penicillins     REACTION: hives and  itching  . Sulfa Antibiotics     rash    SURGICAL HISTORY She  has a past surgical history that includes Tonsillectomy; Vesicovaginal fistula closure w/ TAH; Knee arthroscopy; Abdominal hysterectomy; and Cataract extraction (Right, 2019). FAMILY HISTORY Her family history includes Cancer in her father; Diabetes in her brother; Heart attack in her mother; Heart disease in her mother; Hyperlipidemia in her brother;  Osteoarthritis in her father and mother. SOCIAL HISTORY She  reports that she has never smoked. She has never used smokeless tobacco. She reports current alcohol use.  Physical activity:   Cardiac risk factors:   Depression/mood screen:   Depression screen [facility] 2/9 11/22/2018  Decreased Interest 0  Down, Depressed, Hopeless 0  PHQ - 2 Score 0    ADLs:  In your present state of health, do you have any difficulty performing the following activities: 11/22/2018 05/21/2018  Hearing? N N  Vision? N N  Difficulty concentrating or making decisions? N N  Walking or climbing stairs? N N  Dressing or bathing? N N  Doing errands, shopping? N N  Some recent data might be hidden     Cognitive Testing  Alert? Yes  Normal Appearance?Yes  Oriented to person? Yes  Place? Yes   Time? Yes  Recall of three objects?  Yes  Can perform simple calculations? Yes  Displays appropriate judgment?Yes  Can read the correct time from a watch face?Yes  EOL planning: Does Patient Have a Medical Advance Directive?: Yes Type of Advance Directive: Living will, Healthcare Power of [place] in Chart?: No - copy requested   Objective:     Blood pressure 130/80, pulse 63, temperature (!) 97.4 F (36.3 C), height 5\' 5"  (1.651 m), weight 173 lb (78.5 kg), SpO2 97 %. Body mass index is 28.79 kg/m.  General appearance: alert, no distress, WD/WN,  female HEENT: normocephalic, sclerae anicteric, TMs pearly, nares patent, no discharge or erythema, pharynx normal Oral cavity: MMM, no lesions Neck: supple, no lymphadenopathy, no thyromegaly, no masses Heart: RRR, normal S1, S2, no murmurs Lungs: CTA bilaterally, no wheezes, rhonchi, or rales Abdomen: +bs, soft, non tender, non distended, no masses, no hepatomegaly, no splenomegaly Musculoskeletal: nontender, no swelling, no obvious deformity Extremities: no edema, no cyanosis, no clubbing Pulses: 2+ symmetric, upper and lower  extremities, normal cap refill Neurological: alert, oriented x 3, CN2-12 intact, strength normal upper extremities and lower extremities, sensation normal throughout, DTRs 2+ throughout, no cerebellar signs, gait normal Skin: erythematous rash around bilateral eyes Psychiatric: normal affect, behavior normal, pleasant  Breast:  defer Rectal: defer   Medicare Attestation I have personally reviewed: The patient's medical and social history Their use of alcohol, tobacco or illicit drugs Their current medications and supplements The patient's functional ability including ADLs,fall risks, home safety risks, cognitive, and hearing and visual impairment Diet and physical activities Evidence for depression or mood disorders  The patient's weight, height, BMI, and visual acuity have been recorded in the chart.  I have made referrals, counseling, and provided education to the patient based on review of the above and I have provided the patient with a written personalized care  plan for preventive services.     Vicie Mutters, PA-C   02/26/2019

## 2019-02-27 ENCOUNTER — Other Ambulatory Visit: Payer: Self-pay | Admitting: Internal Medicine

## 2019-02-27 LAB — COMPLETE METABOLIC PANEL WITH GFR
AG Ratio: 1.9 (calc) (ref 1.0–2.5)
ALT: 21 U/L (ref 6–29)
AST: 22 U/L (ref 10–35)
Albumin: 4 g/dL (ref 3.6–5.1)
Alkaline phosphatase (APISO): 84 U/L (ref 37–153)
BUN: 12 mg/dL (ref 7–25)
CO2: 31 mmol/L (ref 20–32)
Calcium: 10.6 mg/dL — ABNORMAL HIGH (ref 8.6–10.4)
Chloride: 100 mmol/L (ref 98–110)
Creat: 0.76 mg/dL (ref 0.60–0.93)
GFR, Est African American: 87 mL/min/{1.73_m2} (ref >=60)
GFR, Est Non African American: 75 mL/min/{1.73_m2} (ref >=60)
Globulin: 2.1 g/dL (calc) (ref 1.9–3.7)
Glucose, Bld: 101 mg/dL — ABNORMAL HIGH (ref 65–99)
Potassium: 4 mmol/L (ref 3.5–5.3)
Sodium: 140 mmol/L (ref 135–146)
Total Bilirubin: 0.5 mg/dL (ref 0.2–1.2)
Total Protein: 6.1 g/dL (ref 6.1–8.1)

## 2019-02-27 LAB — CBC WITH DIFFERENTIAL/PLATELET
Absolute Monocytes: 740 cells/uL (ref 200–950)
Basophils Absolute: 60 cells/uL (ref 0–200)
Basophils Relative: 0.6 %
Eosinophils Absolute: 230 cells/uL (ref 15–500)
Eosinophils Relative: 2.3 %
HCT: 39.5 % (ref 35.0–45.0)
Hemoglobin: 13.6 g/dL (ref 11.7–15.5)
Lymphs Abs: 1570 cells/uL (ref 850–3900)
MCH: 32.7 pg (ref 27.0–33.0)
MCHC: 34.4 g/dL (ref 32.0–36.0)
MCV: 95 fL (ref 80.0–100.0)
MPV: 10.2 fL (ref 7.5–12.5)
Monocytes Relative: 7.4 %
Neutro Abs: 7400 cells/uL (ref 1500–7800)
Neutrophils Relative %: 74 %
Platelets: 269 10*3/uL (ref 140–400)
RBC: 4.16 10*6/uL (ref 3.80–5.10)
RDW: 12.2 % (ref 11.0–15.0)
Total Lymphocyte: 15.7 %
WBC: 10 10*3/uL (ref 3.8–10.8)

## 2019-02-27 LAB — LIPID PANEL
Cholesterol: 166 mg/dL (ref <200)
HDL: 47 mg/dL — ABNORMAL LOW (ref >=50)
LDL Cholesterol (Calc): 92 mg/dL (calc)
Non-HDL Cholesterol (Calc): 119 mg/dL (calc) (ref <130)
Total CHOL/HDL Ratio: 3.5 (calc) (ref <5.0)
Triglycerides: 178 mg/dL — ABNORMAL HIGH (ref <150)

## 2019-02-27 LAB — VITAMIN D 25 HYDROXY (VIT D DEFICIENCY, FRACTURES): Vit D, 25-Hydroxy: 62 ng/mL (ref 30–100)

## 2019-02-27 LAB — MAGNESIUM: Magnesium: 1.8 mg/dL (ref 1.5–2.5)

## 2019-02-27 LAB — TSH: TSH: 2.8 mIU/L (ref 0.40–4.50)

## 2019-03-09 ENCOUNTER — Other Ambulatory Visit: Payer: Self-pay | Admitting: Physician Assistant

## 2019-03-19 ENCOUNTER — Ambulatory Visit: Payer: Medicare Other

## 2019-04-07 ENCOUNTER — Other Ambulatory Visit: Payer: Self-pay | Admitting: Internal Medicine

## 2019-04-07 DIAGNOSIS — I10 Essential (primary) hypertension: Secondary | ICD-10-CM

## 2019-04-18 ENCOUNTER — Telehealth: Payer: Self-pay | Admitting: Orthopaedic Surgery

## 2019-04-18 ENCOUNTER — Other Ambulatory Visit: Payer: Self-pay | Admitting: Physician Assistant

## 2019-04-18 MED ORDER — DICLOFENAC SODIUM 75 MG PO TBEC: 75.0000 mg | DELAYED_RELEASE_TABLET | Freq: Every day | ORAL | 1 refills | Status: DC

## 2019-04-18 MED ORDER — TIZANIDINE HCL 4 MG PO TABS: 4.0000 mg | ORAL_TABLET | Freq: Three times a day (TID) | ORAL | 6 refills | Status: DC | PRN

## 2019-04-18 NOTE — Telephone Encounter (Signed)
Sent in

## 2019-04-18 NOTE — Telephone Encounter (Signed)
Patient called requesting refills. Medications as follow: Tizanidine and  diclofenac tablets. Please send to pharmacy on file. Patient phone number is [phone].

## 2019-04-19 ENCOUNTER — Other Ambulatory Visit: Payer: Self-pay | Admitting: Physician Assistant

## 2019-05-04 ENCOUNTER — Encounter: Payer: Self-pay | Admitting: Orthopaedic Surgery

## 2019-05-04 ENCOUNTER — Other Ambulatory Visit: Payer: Self-pay

## 2019-05-04 ENCOUNTER — Ambulatory Visit: Payer: Medicare Other | Admitting: Orthopaedic Surgery

## 2019-05-04 DIAGNOSIS — M1711 Unilateral primary osteoarthritis, right knee: Secondary | ICD-10-CM

## 2019-05-04 DIAGNOSIS — M1712 Unilateral primary osteoarthritis, left knee: Secondary | ICD-10-CM

## 2019-05-04 MED ORDER — BUPIVACAINE HCL 0.5 % IJ SOLN
2.0000 mL | INTRAMUSCULAR | Status: AC | PRN
  Administered 2019-05-04: 2 mL via INTRA_ARTICULAR

## 2019-05-04 MED ORDER — METHYLPREDNISOLONE ACETATE 40 MG/ML IJ SUSP
40.0000 mg | INTRAMUSCULAR | Status: AC | PRN
  Administered 2019-05-04: 40 mg via INTRA_ARTICULAR

## 2019-05-04 MED ORDER — LIDOCAINE HCL 1 % IJ SOLN
2.0000 mL | INTRAMUSCULAR | Status: AC | PRN
  Administered 2019-05-04: 2 mL

## 2019-05-04 NOTE — Progress Notes (Signed)
Office Visit Note   Patient: Kelsey Lopez           Date of Birth: 25-Apr-1940           MRN: [id number] Visit Date: 05/04/2019              Requested by: Unk Pinto, [address] [place],  [place] [address] PCP: Unk Pinto, MD   Assessment & Plan: Visit Diagnoses:  1. Unilateral primary osteoarthritis, left knee   2. Unilateral primary osteoarthritis, right knee     Plan: Bilateral cortisone injections performed today.  Patient tolerated this well.  Follow-up as needed.  Follow-Up Instructions: Return if symptoms worsen or fail to improve.   Orders:  No orders of the defined types were placed in this encounter.  No orders of the defined types were placed in this encounter.     Procedures: Large Joint Inj: bilateral knee on 05/04/2019 10:48 AM Indications: pain Details: 22 G needle  Arthrogram: No  Medications (Right): 2 mL lidocaine 1 %; 2 mL bupivacaine 0.5 %; 40 mg methylPREDNISolone acetate 40 MG/ML Medications (Left): 2 mL lidocaine 1 %; 2 mL bupivacaine 0.5 %; 40 mg methylPREDNISolone acetate 40 MG/ML Outcome: tolerated well, no immediate complications Patient was prepped and draped in the usual sterile fashion.       Clinical Data: No additional findings.   Subjective: Chief Complaint  Patient presents with  . Right Knee - Pain  . Left Knee - Pain    Kelsey Lopez returns today for follow-up of bilateral knee OA.  Previous injection was June of last year.  She is still getting relief from these injections.   Review of Systems   Objective: Vital Signs: There were no vitals taken for this visit.  Physical Exam  Ortho Exam Bilateral knees are stable. Specialty Comments:  No specialty comments available.  Imaging: No results found.   PMFS History: Patient Active Problem List   Diagnosis Date Noted  . Unilateral primary osteoarthritis, left knee 05/04/2019  . Unilateral primary osteoarthritis, right knee  05/04/2019  . COPD (chronic obstructive pulmonary disease) with chronic bronchitis ([place]) 02/26/2019  . Stage 3 chronic kidney disease 01/14/2017  . Depression, major, in remission ([place]) 11/27/2014  . Overweight (BMI 25.0-29.9) 04/10/2014  . Insomnia 04/10/2014  . Vitamin D deficiency 01/07/2014  . Abnormal glucose 01/03/2014  . Medication management 01/03/2014  . Anxiety   . Morton's neuroma of left foot   . Osteoarthritis   . Hyperlipidemia, mixed 06/05/2009  . Essential hypertension 06/05/2009   Past Medical History:  Diagnosis Date  . Anxiety   . Dyslipidemia    on Rx x20 years  . HTN (hypertension)   . Hyperlipidemia   . Insomnia   . Morton's neuroma of left foot   . Multiple allergies   . Osteoarthritis   . Polycythemia   . PVC (premature ventricular contraction)     Family History  Problem Relation Age of Onset  . Osteoarthritis Mother   . Heart attack Mother   . Heart disease Mother   . Cancer Father        lung  . Osteoarthritis Father   . Hyperlipidemia Brother   . Diabetes Brother     Past Surgical History:  Procedure Laterality Date  . ABDOMINAL HYSTERECTOMY    . CATARACT EXTRACTION Right 2019   Dr. Ellie Lunch   . KNEE ARTHROSCOPY     R  . TONSILLECTOMY    . VESICOVAGINAL FISTULA CLOSURE  W/ TAH     Social History   Occupational History  . Not on file  Tobacco Use  . Smoking status: Never Smoker  . Smokeless tobacco: Never Used  . Tobacco comment: no tobacco   Substance and Sexual Activity  . Alcohol use: Yes    Comment: rare  . Drug use: Not on file  . Sexual activity: Not on file

## 2019-05-07 ENCOUNTER — Other Ambulatory Visit: Payer: Self-pay | Admitting: Physician Assistant

## 2019-05-07 DIAGNOSIS — F419 Anxiety disorder, unspecified: Secondary | ICD-10-CM

## 2019-05-29 ENCOUNTER — Encounter: Payer: Self-pay | Admitting: Internal Medicine

## 2019-05-29 ENCOUNTER — Ambulatory Visit: Payer: Medicare Other | Admitting: Internal Medicine

## 2019-05-29 NOTE — Progress Notes (Signed)
History of Present Illness:       This very nice 79 y.o. WWF  presents for 6 month follow up with HTN, HLD, Pre-Diabetes and Vitamin D Deficiency.       Patient is treated for HTN (1996) & BP has been controlled at home. Today's  . Patient has had no complaints of any cardiac type chest pain, palpitations, dyspnea / orthopnea / PND, dizziness, claudication, or dependent edema.      Hyperlipidemia is controlled with diet & meds. Patient denies myalgias or other med SE's. Last Lipids were at goal except elevated Trig's:  Lab Results  Component Value Date   CHOL 166 02/26/2019   HDL 47 (L) 02/26/2019   LDLCALC 92 02/26/2019   TRIG 178 (H) 02/26/2019   CHOLHDL 3.5 02/26/2019    Also, the patient has history of PreDiabetes (A1c 5.8% / 2015&5.3% / 2017) and has had no symptoms of reactive hypoglycemia, diabetic polys, paresthesias or visual blurring.  Last A1c was Normal & at goal:  Lab Results  Component Value Date   HGBA1C 5.2 11/23/2018       Further, the patient also has history of Vitamin D Deficiency and supplements vitamin D without any suspected side-effects. Last vitamin D was at goal:   Lab Results  Component Value Date   VD25OH 62 02/26/2019    Current Outpatient Medications on File Prior to Visit  Medication Sig  . ALPRAZolam (XANAX) 0.5 MG tablet TAKE 1/2 TO 1 TABLET 2 - 3 TIMES PER DAY ONLY AS NEEDED FOR ANXIETY. LIMIT TO 5 DAYS PER WEEK.  Marland Kitchen aspirin 81 MG EC tablet Take 81 mg by mouth daily.    . cetirizine (ZYRTEC) 10 MG tablet Take 10 mg by mouth as needed.    . Cholecalciferol (VITAMIN D3) 2000 UNITS capsule Take 2,000 Units by mouth daily.    . citalopram (CELEXA) 40 MG tablet TAKE 1 TABLET BY MOUTH DAILY FOR MOOD  . conjugated estrogens (PREMARIN) vaginal cream Place 1 Applicatorful vaginally. Use once every week  . diclofenac (VOLTAREN) 75 MG EC tablet Take 1 tablet (75 mg total) by mouth daily.  . diclofenac sodium (VOLTAREN) 1 % GEL Apply 2 g  topically 4 (four) times daily.  . Flaxseed, Linseed, (FLAX SEED OIL) 1000 MG CAPS Take 1,000 mg by mouth daily.  . hydrochlorothiazide (HYDRODIURIL) 25 MG tablet Take 1 tablet Daily for BP & Fluid Retention / Ankle Swelling  . losartan (COZAAR) 100 MG tablet Take 1 tablet Daily for BP  . Magnesium 400 MG CAPS Take 400 mg by mouth daily.  . montelukast (SINGULAIR) 10 MG tablet TAKE 1 TABLET BY MOUTH EVERY DAY  . Multiple Vitamin (MULTIVITAMIN) tablet Take 1 tablet by mouth daily.    . NON FORMULARY Calcium 1800 mg 1 tablet daily   . OVER THE COUNTER MEDICATION Otc Mucinex 1 tab daily  . simvastatin (ZOCOR) 40 MG tablet TAKE 1 TABLET BY MOUTH EVERYDAY AT BEDTIME  . tiZANidine (ZANAFLEX) 4 MG tablet Take 1 tablet (4 mg total) by mouth every 8 (eight) hours as needed for muscle spasms.  . traZODone (DESYREL) 150 MG tablet TAKE 1/2 TO 1 TABLET 1 HOUR BEFORE BEDTIME IF NEEDED FOR SLEEP  . vitamin C (ASCORBIC ACID) 500 MG tablet Take 500 mg by mouth daily.   No current facility-administered medications on file prior to visit.    Allergies  Allergen Reactions  . Ace Inhibitors     Cough  .  Ampicillin     rash  . Mevacor [Lovastatin]     Elevated LFT's  . Penicillins     REACTION: hives and  itching  . Sulfa Antibiotics     rash    PMHx:   Past Medical History:  Diagnosis Date  . Anxiety   . Dyslipidemia    on Rx x20 years  . HTN (hypertension)   . Hyperlipidemia   . Insomnia   . Morton's neuroma of left foot   . Multiple allergies   . Osteoarthritis   . Polycythemia   . PVC (premature ventricular contraction)     Immunization History  Administered Date(s) Administered  . Influenza Split 11/15/2011, 11/28/2012, 10/31/2013, 01/07/2014  . Influenza, High Dose Seasonal PF 11/27/2014, 11/28/2015, 10/30/2017, 10/01/2018  . Influenza-Unspecified 09/29/2016, 10/01/2018  . Pneumococcal Conjugate-13 10/02/2013  . Pneumococcal Polysaccharide-23 08/03/2017  . Tdap 08/01/2013  .  Zoster 05/25/2011  . Zoster Recombinat (Shingrix) 11/10/2016, 01/10/2017    Past Surgical History:  Procedure Laterality Date  . ABDOMINAL HYSTERECTOMY    . CATARACT EXTRACTION Right 2019   Dr. Ellie Lunch   . KNEE ARTHROSCOPY     R  . TONSILLECTOMY    . VESICOVAGINAL FISTULA CLOSURE W/ TAH      FHx:    Reviewed / unchanged  SHx:    Reviewed / unchanged   Systems Review:  Constitutional: Denies fever, chills, wt changes, headaches, insomnia, fatigue, night sweats, change in appetite. Eyes: Denies redness, blurred vision, diplopia, discharge, itchy, watery eyes.  ENT: Denies discharge, congestion, post nasal drip, epistaxis, sore throat, earache, hearing loss, dental pain, tinnitus, vertigo, sinus pain, snoring.  CV: Denies chest pain, palpitations, irregular heartbeat, syncope, dyspnea, diaphoresis, orthopnea, PND, claudication or edema. Respiratory: denies cough, dyspnea, DOE, pleurisy, hoarseness, laryngitis, wheezing.  Gastrointestinal: Denies dysphagia, odynophagia, heartburn, reflux, water brash, abdominal pain or cramps, nausea, vomiting, bloating, diarrhea, constipation, hematemesis, melena, hematochezia  or hemorrhoids. Genitourinary: Denies dysuria, frequency, urgency, nocturia, hesitancy, discharge, hematuria or flank pain. Musculoskeletal: Denies arthralgias, myalgias, stiffness, jt. swelling, pain, limping or strain/sprain.  Skin: Denies pruritus, rash, hives, warts, acne, eczema or change in skin lesion(s). Neuro: No weakness, tremor, incoordination, spasms, paresthesia or pain. Psychiatric: Denies confusion, memory loss or sensory loss. Endo: Denies change in weight, skin or hair change.  Heme/Lymph: No excessive bleeding, bruising or enlarged lymph nodes.  Physical Exam  There were no vitals taken for this visit.  Appears  well nourished, well groomed  and in no distress.  Eyes: PERRLA, EOMs, conjunctiva no swelling or erythema. Sinuses: No frontal/maxillary  tenderness ENT/Mouth: EAC's clear, TM's nl w/o erythema, bulging. Nares clear w/o erythema, swelling, exudates. Oropharynx clear without erythema or exudates. Oral hygiene is good. Tongue normal, non obstructing. Hearing intact.  Neck: Supple. Thyroid not palpable. Car 2+/2+ without bruits, nodes or JVD. Chest: Respirations nl with BS clear & equal w/o rales, rhonchi, wheezing or stridor.  Cor: Heart sounds normal w/ regular rate and rhythm without sig. murmurs, gallops, clicks or rubs. Peripheral pulses normal and equal  without edema.  Abdomen: Soft & bowel sounds normal. Non-tender w/o guarding, rebound, hernias, masses or organomegaly.  Lymphatics: Unremarkable.  Musculoskeletal: Full ROM all peripheral extremities, joint stability, 5/5 strength and normal gait.  Skin: Warm, dry without exposed rashes, lesions or ecchymosis apparent.  Neuro: Cranial nerves intact, reflexes equal bilaterally. Sensory-motor testing grossly intact. Tendon reflexes grossly intact.  Pysch: Alert & oriented x 3.  Insight and judgement nl & appropriate. No ideations.  Assessment  and Plan:  1. Essential hypertension - Continue medication, monitor blood pressure at home.  - Continue DASH diet.  Reminder to go to the ER if any CP,  SOB, nausea, dizziness, severe HA, changes vision/speech.  - CBC with Differential/Platelet - COMPLETE METABOLIC PANEL WITH GFR - Magnesium - TSH  2. Hyperlipidemia, mixed  - Continue diet/meds, exercise,& lifestyle modifications.  - Continue monitor periodic cholesterol/liver & renal functions   - Lipid panel - TSH  3. Abnormal glucose  - Continue diet, exercise  - Lifestyle modifications.  - Monitor appropriate labs.  - Hemoglobin A1c - Insulin, random  4. Vitamin D deficiency  - Continue supplementation.  - VITAMIN D 25 Hydroxy  5. Prediabetes  - Hemoglobin A1c - Insulin, random  6. Medication management  - CBC with Differential/Platelet - COMPLETE  METABOLIC PANEL WITH GFR - Magnesium - Lipid panel - TSH - Hemoglobin A1c - Insulin, random - VITAMIN D 25 Hydroxy        Discussed  regular exercise, BP monitoring, weight control to achieve/maintain BMI less than 25 and discussed med and SE's. Recommended labs to assess and monitor clinical status with further disposition pending results of labs.  I discussed the assessment and treatment plan with the patient. The patient was provided an opportunity to ask questions and all were answered. The patient agreed with the plan and demonstrated an understanding of the instructions.  I provided over 30 minutes of exam, counseling, chart review and  complex critical decision making.   Kirtland Bouchard, MD

## 2019-05-29 NOTE — Patient Instructions (Signed)

## 2019-05-30 ENCOUNTER — Encounter: Payer: Self-pay | Admitting: Internal Medicine

## 2019-05-30 ENCOUNTER — Other Ambulatory Visit: Payer: Self-pay

## 2019-05-30 ENCOUNTER — Ambulatory Visit: Payer: Medicare Other | Admitting: Internal Medicine

## 2019-05-30 VITALS — BP 118/66 | HR 56 | Temp 97.6°F | Resp 16 | Ht 65.0 in | Wt 168.0 lb

## 2019-05-30 DIAGNOSIS — I10 Essential (primary) hypertension: Secondary | ICD-10-CM | POA: Diagnosis not present

## 2019-05-30 DIAGNOSIS — Z79899 Other long term (current) drug therapy: Secondary | ICD-10-CM

## 2019-05-30 DIAGNOSIS — R7303 Prediabetes: Secondary | ICD-10-CM

## 2019-05-30 DIAGNOSIS — R7309 Other abnormal glucose: Secondary | ICD-10-CM

## 2019-05-30 DIAGNOSIS — E782 Mixed hyperlipidemia: Secondary | ICD-10-CM

## 2019-05-30 DIAGNOSIS — R300 Dysuria: Secondary | ICD-10-CM

## 2019-05-30 DIAGNOSIS — E559 Vitamin D deficiency, unspecified: Secondary | ICD-10-CM

## 2019-05-31 LAB — COMPLETE METABOLIC PANEL WITH GFR
AG Ratio: 1.8 (calc) (ref 1.0–2.5)
ALT: 18 U/L (ref 6–29)
AST: 18 U/L (ref 10–35)
Albumin: 3.9 g/dL (ref 3.6–5.1)
Alkaline phosphatase (APISO): 78 U/L (ref 37–153)
BUN: 11 mg/dL (ref 7–25)
CO2: 29 mmol/L (ref 20–32)
Calcium: 10.3 mg/dL (ref 8.6–10.4)
Chloride: 96 mmol/L — ABNORMAL LOW (ref 98–110)
Creat: 0.68 mg/dL (ref 0.60–0.93)
GFR, Est African American: 97 mL/min/{1.73_m2} (ref >=60)
GFR, Est Non African American: 84 mL/min/{1.73_m2} (ref >=60)
Globulin: 2.2 g/dL (calc) (ref 1.9–3.7)
Glucose, Bld: 97 mg/dL (ref 65–99)
Potassium: 3.7 mmol/L (ref 3.5–5.3)
Sodium: 134 mmol/L — ABNORMAL LOW (ref 135–146)
Total Bilirubin: 0.7 mg/dL (ref 0.2–1.2)
Total Protein: 6.1 g/dL (ref 6.1–8.1)

## 2019-05-31 LAB — TSH: TSH: 1.76 mIU/L (ref 0.40–4.50)

## 2019-05-31 LAB — URINALYSIS, ROUTINE W REFLEX MICROSCOPIC
Bacteria, UA: NONE SEEN /HPF
Bilirubin Urine: NEGATIVE
Glucose, UA: NEGATIVE
Hgb urine dipstick: NEGATIVE
Hyaline Cast: NONE SEEN /LPF
Ketones, ur: NEGATIVE
Nitrite: NEGATIVE
Protein, ur: NEGATIVE
Specific Gravity, Urine: 1.016 (ref 1.001–1.03)
WBC, UA: >=60 /HPF — AB (ref 0–5)
pH: 8 (ref 5.0–8.0)

## 2019-05-31 LAB — URINE CULTURE
MICRO NUMBER:: 10390969
SPECIMEN QUALITY:: ADEQUATE

## 2019-05-31 LAB — CBC WITH DIFFERENTIAL/PLATELET
Absolute Monocytes: 590 cells/uL (ref 200–950)
Basophils Absolute: 29 cells/uL (ref 0–200)
Basophils Relative: 0.4 %
Eosinophils Absolute: 101 cells/uL (ref 15–500)
Eosinophils Relative: 1.4 %
HCT: 37.7 % (ref 35.0–45.0)
Hemoglobin: 13.2 g/dL (ref 11.7–15.5)
Lymphs Abs: 1181 cells/uL (ref 850–3900)
MCH: 32.4 pg (ref 27.0–33.0)
MCHC: 35 g/dL (ref 32.0–36.0)
MCV: 92.6 fL (ref 80.0–100.0)
MPV: 9.8 fL (ref 7.5–12.5)
Monocytes Relative: 8.2 %
Neutro Abs: 5299 cells/uL (ref 1500–7800)
Neutrophils Relative %: 73.6 %
Platelets: 222 10*3/uL (ref 140–400)
RBC: 4.07 10*6/uL (ref 3.80–5.10)
RDW: 12.4 % (ref 11.0–15.0)
Total Lymphocyte: 16.4 %
WBC: 7.2 10*3/uL (ref 3.8–10.8)

## 2019-05-31 LAB — MAGNESIUM: Magnesium: 1.7 mg/dL (ref 1.5–2.5)

## 2019-05-31 LAB — HEMOGLOBIN A1C
Hgb A1c MFr Bld: 5.5 % of total Hgb (ref <5.7)
Mean Plasma Glucose: 111 (calc)
eAG (mmol/L): 6.2 (calc)

## 2019-05-31 LAB — LIPID PANEL
Cholesterol: 159 mg/dL (ref <200)
HDL: 64 mg/dL (ref >=50)
LDL Cholesterol (Calc): 79 mg/dL (calc)
Non-HDL Cholesterol (Calc): 95 mg/dL (calc) (ref <130)
Total CHOL/HDL Ratio: 2.5 (calc) (ref <5.0)
Triglycerides: 76 mg/dL (ref <150)

## 2019-05-31 LAB — VITAMIN D 25 HYDROXY (VIT D DEFICIENCY, FRACTURES): Vit D, 25-Hydroxy: 61 ng/mL (ref 30–100)

## 2019-05-31 LAB — INSULIN, RANDOM: Insulin: 8.6 u[IU]/mL

## 2019-06-12 ENCOUNTER — Other Ambulatory Visit: Payer: Self-pay | Admitting: Physician Assistant

## 2019-08-14 ENCOUNTER — Other Ambulatory Visit: Payer: Self-pay | Admitting: Internal Medicine

## 2019-08-14 DIAGNOSIS — I10 Essential (primary) hypertension: Secondary | ICD-10-CM

## 2019-08-14 MED ORDER — HYDROCHLOROTHIAZIDE 12.5 MG PO TABS: ORAL_TABLET | ORAL | 0 refills | Status: DC

## 2019-08-14 MED ORDER — LOSARTAN POTASSIUM 50 MG PO TABS: ORAL_TABLET | ORAL | 0 refills | Status: DC

## 2019-08-14 MED ORDER — LOSARTAN POTASSIUM 100 MG PO TABS: ORAL_TABLET | ORAL | 3 refills | Status: DC

## 2019-09-06 ENCOUNTER — Other Ambulatory Visit: Payer: Self-pay | Admitting: Adult Health Nurse Practitioner

## 2019-10-16 ENCOUNTER — Other Ambulatory Visit: Payer: Self-pay | Admitting: Physician Assistant

## 2019-10-30 ENCOUNTER — Ambulatory Visit: Payer: Self-pay

## 2019-10-30 ENCOUNTER — Ambulatory Visit: Payer: Medicare Other | Admitting: Orthopaedic Surgery

## 2019-10-30 DIAGNOSIS — M545 Low back pain, unspecified: Secondary | ICD-10-CM

## 2019-10-30 MED ORDER — PREDNISONE 10 MG (21) PO TBPK
ORAL_TABLET | ORAL | 0 refills | Status: DC
Start: 2019-10-30 — End: 2019-11-06

## 2019-10-30 NOTE — Progress Notes (Signed)
Office Visit Note   Patient: Kelsey Lopez           Date of Birth: Feb 08, 1941           MRN: [id number] Visit Date: 10/30/2019              Requested by: Unk Pinto, [address] [place],  [place] [address] PCP: Unk Pinto, MD   Assessment & Plan: Visit Diagnoses:  1. Low back pain, unspecified back pain laterality, unspecified chronicity, unspecified whether sciatica present     Plan: Impression is exacerbation of a degenerative scoliosis.  No evidence of radiculopathy.  I sent a prescription for prednisone Dosepak and have also recommended taking over-the-counter NSAIDs.  She will also continue take Zanaflex as needed.  Use heat and over-the-counter muscle rubs.  Follow-up as needed.  Follow-Up Instructions: Return if symptoms worsen or fail to improve.   Orders:  Orders Placed This Encounter  Procedures  . XR Lumbar Spine 2-3 Views   Meds ordered this encounter  Medications  . predniSONE (STERAPRED UNI-PAK 21 TAB) 10 MG (21) TBPK tablet    Sig: Take as directed    Dispense:  21 tablet    Refill:  0      Procedures: No procedures performed   Clinical Data: No additional findings.   Subjective: Chief Complaint  Patient presents with  . Lower Back - Pain    HPI patient is a very pleasant 79 year old female who comes in today with mid and lower back pain.  She has been having this intermittently for the past several months but has become more constant over the past 3 weeks.  She does note improvement of symptoms at today's visit.  She has had no specific injury or change in activity.  There are no specific aggravators either.  She has been taking Tylenol, tizanidine and using Voltaren gel with good relief of symptoms.  She denies any radicular pain down either leg.  No numbness, tingling or burning anywhere.  No bowel or bladder change and no saddle paresthesias.  Review of Systems as detailed in HPI.  All others reviewed and are  negative.   Objective: Vital Signs: There were no vitals taken for this visit.  Physical Exam well-developed well-nourished female no acute distress.  Alert and oriented x3.  Ortho Exam examination of her lumbar spine reveals mild spinous tenderness around the upper lumbar spine.  No paraspinous tenderness.  She has mildly increased pain with lumbar extension.  No pain with lumbar flexion.  Negative straight leg raise both sides.  No focal weakness.  She is neurovascular intact distally.  Specialty Comments:  No specialty comments available.  Imaging: No results found.   PMFS History: Patient Active Problem List   Diagnosis Date Noted  . Unilateral primary osteoarthritis, left knee 05/04/2019  . Unilateral primary osteoarthritis, right knee 05/04/2019  . COPD (chronic obstructive pulmonary disease) with chronic bronchitis ([place]) 02/26/2019  . Stage 3 chronic kidney disease 01/14/2017  . Depression, major, in remission ([place]) 11/27/2014  . Overweight (BMI 25.0-29.9) 04/10/2014  . Insomnia 04/10/2014  . Vitamin D deficiency 01/07/2014  . Abnormal glucose 01/03/2014  . Medication management 01/03/2014  . Anxiety   . Morton's neuroma of left foot   . Osteoarthritis   . Hyperlipidemia, mixed 06/05/2009  . Essential hypertension 06/05/2009   Past Medical History:  Diagnosis Date  . Anxiety   . Dyslipidemia    on Rx x20 years  . HTN (hypertension)   .  Hyperlipidemia   . Insomnia   . Morton's neuroma of left foot   . Multiple allergies   . Osteoarthritis   . Polycythemia   . PVC (premature ventricular contraction)     Family History  Problem Relation Age of Onset  . Osteoarthritis Mother   . Heart attack Mother   . Heart disease Mother   . Cancer Father        lung  . Osteoarthritis Father   . Hyperlipidemia Brother   . Diabetes Brother     Past Surgical History:  Procedure Laterality Date  . ABDOMINAL HYSTERECTOMY    . CATARACT EXTRACTION Right 2019   Dr.  Ellie Lunch   . KNEE ARTHROSCOPY     R  . TONSILLECTOMY    . VESICOVAGINAL FISTULA CLOSURE W/ TAH     Social History   Occupational History  . Not on file  Tobacco Use  . Smoking status: Never Smoker  . Smokeless tobacco: Never Used  . Tobacco comment: no tobacco   Substance and Sexual Activity  . Alcohol use: Yes    Comment: rare  . Drug use: Not on file  . Sexual activity: Not on file

## 2019-11-06 ENCOUNTER — Other Ambulatory Visit: Payer: Self-pay | Admitting: Internal Medicine

## 2019-11-06 ENCOUNTER — Telehealth: Payer: Self-pay

## 2019-11-06 MED ORDER — DEXAMETHASONE 0.5 MG PO TABS: ORAL_TABLET | ORAL | 0 refills | Status: DC

## 2019-11-06 NOTE — Telephone Encounter (Signed)
Patient states that she has been having cold symptoms for a week: cough, sneezing and headache. Tested negative for Covid.  Per Dr. Melford Aase, he sent in a prescription and advised patient to take an OTC decongestant.

## 2019-11-10 ENCOUNTER — Other Ambulatory Visit: Payer: Self-pay | Admitting: Internal Medicine

## 2019-11-10 DIAGNOSIS — I10 Essential (primary) hypertension: Secondary | ICD-10-CM

## 2019-11-10 MED ORDER — LOSARTAN POTASSIUM 50 MG PO TABS: ORAL_TABLET | ORAL | 0 refills | Status: DC

## 2019-11-26 ENCOUNTER — Other Ambulatory Visit: Payer: Self-pay | Admitting: Internal Medicine

## 2019-11-26 DIAGNOSIS — F419 Anxiety disorder, unspecified: Secondary | ICD-10-CM

## 2019-11-26 MED ORDER — ALPRAZOLAM 0.5 MG PO TABS: ORAL_TABLET | ORAL | 0 refills | Status: DC

## 2019-12-25 ENCOUNTER — Encounter: Payer: Self-pay | Admitting: Internal Medicine

## 2019-12-25 NOTE — Patient Instructions (Signed)

## 2019-12-25 NOTE — Progress Notes (Signed)
Annual Screening/Preventative Visit & Comprehensive Evaluation &  Examination      This very nice 79 y.o.  WWF presents for a Screening /Preventative Visit & comprehensive evaluation and management of multiple medical co-morbidities.  Patient has been followed for HTN, HLD, Prediabetes  and Vitamin D Deficiency.     Patient relates she was recently dx'd with Degenerative Scoliosis by Dr Erlinda Hong and treated with a steroid taper with improvement, but still continues to have pain.        HTN predates since 1996. Patient's BP has been controlled at home and patient denies any cardiac symptoms as chest pain, palpitations, shortness of breath, dizziness or ankle swelling. Today's BP is at goal  - 138/76.       Patient's hyperlipidemia is controlled with diet and  Simvastatin. Patient denies myalgias or other medication SE's. Last lipids were at goal:  Lab Results  Component Value Date   CHOL 159 05/30/2019   HDL 64 05/30/2019   LDLCALC 79 05/30/2019   TRIG 76 05/30/2019   CHOLHDL 2.5 05/30/2019       Patient has hx/o prediabetes(A1c 5.8% /2015)and patient denies reactive hypoglycemic symptoms, visual blurring, diabetic polys or paresthesias. Last A1c was Normal & at goal:   Lab Results  Component Value Date   HGBA1C 5.5 05/30/2019       Finally, patient has history of Vitamin D Deficiency and last Vitamin D was at goal:  Lab Results  Component Value Date   VD25OH 61 05/30/2019    Current Outpatient Medications on File Prior to Visit  Medication Sig  . ALPRAZolam (XANAX) 0.5 MG tablet Take  1/2 - 1 tablet at Bedtime       . aspirin 81 MG EC tablet Take  daily.    . cetirizine  10 MG tablet Take  as needed.    Marland Kitchen VITAMIN D 2000 UNITS  Take 2,000 Units  daily.    . citalopram  40 MG tablet TAKE 1 TABLET  DAILY   . conj estrogens (PREMARIN) vag crm 1 Applicatorful vaginally every week  . diclofenac (VOLTAREN) 75 MG EC tablet Take 1 tablet  daily.  . diclofenac  1 % GEL Apply 2 g  topically 4 (four) times daily.  Marland Kitchen FLAX SEED OIL 1000 MG  Take  daily.  . hydrochlorothiazide  12.5 MG t TAKE 1 TABLET DAILY   . losartan  50 MG  Take     1 tablet     Daily     for BP  . Magnesium 400 MG  Take  daily.  . montelukast  10 MG  Take 1 tablet daily for allergies.  . Multiple Vitamin  Take 1 tablet daily.    . Calcium 1800 mg  1 tablet daily   . Otc Mucinex  1 tab daily  . simvastatin 40 MG  TAKE 1 TABLET AT BEDTIME  . tiZANidine 4 MG  Take 1 tablet  every 8 hours as needed  . traZODone  150 MG 1/2-1 TABLET 1 HR BEFORE BEDTIME   . vitamin C  500 MG t Take  daily.    Allergies  Allergen Reactions  . Ace Inhibitors     Cough  . Ampicillin     rash  . Mevacor [Lovastatin]     Elevated LFT's  . Penicillins     REACTION: hives and  itching  . Sulfa Antibiotics     rash    Past Medical History:  Diagnosis Date  .  Anxiety   . Dyslipidemia    on Rx x20 years  . HTN (hypertension)   . Hyperlipidemia   . Insomnia   . Morton's neuroma of left foot   . Multiple allergies   . Osteoarthritis   . Polycythemia   . PVC (premature ventricular contraction)    Health Maintenance  Topic Date Due  . Hepatitis C Screening  Never done  . MAMMOGRAM  12/21/2019  . TETANUS/TDAP  08/02/2023  . INFLUENZA VACCINE  Completed  . DEXA SCAN  Completed  . COVID-19 Vaccine  Completed  . PNA vac Low Risk Adult  Completed   Immunization History  Administered Date(s) Administered  . Influenza Split 11/15/2011, 11/28/2012, 10/31/2013, 01/07/2014  . Influenza, High Dose Seasonal PF 11/27/2014, 11/28/2015, 09/29/2016, 10/30/2017, 10/01/2018, 10/23/2019  . Influenza-Unspecified 09/29/2016, 10/01/2018  . PFIZER SARS-COV-2 Vaccination 03/02/2019, 03/23/2019, 11/20/2019  . Pneumococcal Conjugate-13 10/02/2013  . Pneumococcal Polysaccharide-23 08/03/2017  . Tdap 08/01/2013  . Zoster 05/25/2011  . Zoster Recombinat (Shingrix) 11/10/2016, 01/10/2017    Last Colon - 05/15/2012 - Dr  Watt Climes- Recc no f/u due to Age  Last MGM - 12/21/2018  Past Surgical History:  Procedure Laterality Date  . ABDOMINAL HYSTERECTOMY    . CATARACT EXTRACTION Right 2019   Dr. Ellie Lunch   . KNEE ARTHROSCOPY     R  . TONSILLECTOMY    . VESICOVAGINAL FISTULA CLOSURE W/ TAH     Family History  Problem Relation Age of Onset  . Osteoarthritis Mother   . Heart attack Mother   . Heart disease Mother   . Cancer Father        lung  . Osteoarthritis Father   . Hyperlipidemia Brother   . Diabetes Brother      ROS Constitutional: Denies fever, chills, weight loss/gain, headaches, insomnia,  night sweats, and change in appetite. Does c/o fatigue. Eyes: Denies redness, blurred vision, diplopia, discharge, itchy, watery eyes.  ENT: Denies discharge, congestion, post nasal drip, epistaxis, sore throat, earache, hearing loss, dental pain, Tinnitus, Vertigo, Sinus pain, snoring.  Cardio: Denies chest pain, palpitations, irregular heartbeat, syncope, dyspnea, diaphoresis, orthopnea, PND, claudication, edema Respiratory: denies cough, dyspnea, DOE, pleurisy, hoarseness, laryngitis, wheezing.  Gastrointestinal: Denies dysphagia, heartburn, reflux, water brash, pain, cramps, nausea, vomiting, bloating, diarrhea, constipation, hematemesis, melena, hematochezia, jaundice, hemorrhoids Genitourinary: Denies dysuria, frequency, urgency, nocturia, hesitancy, discharge, hematuria, flank pain Breast: Breast lumps, nipple discharge, bleeding.  Musculoskeletal: Denies arthralgia, myalgia, stiffness, Jt. Swelling, pain, limp, and strain/sprain. Denies falls. Skin: Denies puritis, rash, hives, warts, acne, eczema, changing in skin lesion Neuro: No weakness, tremor, incoordination, spasms, paresthesia, pain Psychiatric: Denies confusion, memory loss, sensory loss. Denies Depression. Endocrine: Denies change in weight, skin, hair change, nocturia, and paresthesia, diabetic polys, visual blurring, hyper / hypo glycemic  episodes.  Heme/Lymph: No excessive bleeding, bruising, enlarged lymph nodes.  Physical Exam  BP 138/76   Pulse 71   Temp 97.9 F (36.6 C)   Resp 16   Ht 5\' 5"  (1.651 m)   Wt 166 lb (75.3 kg)   SpO2 93%   BMI 27.62 kg/m   General Appearance: Well nourished, well groomed and in no apparent distress.  Eyes: PERRLA, EOMs, conjunctiva no swelling or erythema, normal fundi and vessels. Sinuses: No frontal/maxillary tenderness ENT/Mouth: EACs patent / TMs  nl. Nares clear without erythema, swelling, mucoid exudates. Oral hygiene is good. No erythema, swelling, or exudate. Tongue normal, non-obstructing. Tonsils not swollen or erythematous. Hearing normal.  Neck: Supple, thyroid not palpable. No bruits,  nodes or JVD. Respiratory: Respiratory effort normal.  BS equal and clear bilateral without rales, rhonci, wheezing or stridor. Cardio: Heart sounds are normal with regular rate and rhythm and no murmurs, rubs or gallops. Peripheral pulses are normal and equal bilaterally without edema. No aortic or femoral bruits. Chest: symmetric with normal excursions and percussion. Breasts: Symmetric, without lumps, nipple discharge, retractions, or fibrocystic changes.  Abdomen: Flat, soft with bowel sounds active. Nontender, no guarding, rebound, hernias, masses, or organomegaly.  Lymphatics: Non tender without lymphadenopathy.   Musculoskeletal: Full ROM all peripheral extremities, joint stability, 5/5 strength, and normal gait. Skin: Warm and dry without rashes, lesions, cyanosis, clubbing or  ecchymosis.  Neuro: Cranial nerves intact, reflexes equal bilaterally. Normal muscle tone, no cerebellar symptoms. Sensation intact.  Pysch: Alert and oriented X 3, normal affect, Insight and Judgment appropriate.   Assessment and Plan  1. Annual Preventative Screening Examination   2. Essential hypertension  - EKG 12-Lead - Urinalysis, Routine w reflex microscopic - Microalbumin / creatinine urine  ratio - CBC with Differential/Platelet - COMPLETE METABOLIC PANEL WITH GFR - Magnesium - TSH  3. Hyperlipidemia, mixed  - EKG 12-Lead - Lipid panel - TSH  4. Abnormal glucose  - EKG 12-Lead - Hemoglobin A1c - Insulin, random  5. Vitamin D deficiency  - VITAMIN D 25 Hydroxyl  6. Prediabetes  - EKG 12-Lead - Hemoglobin A1c - Insulin, random  7. Stage 2 chronic kidney disease  - COMPLETE METABOLIC PANEL WITH GFR  8. Depression, major, in remission ([place])   9. Screening for colorectal cancer  - POC Hemoccult Bld/Stl   10. Screening for ischemic heart disease  - EKG 12-Lead  11. FHx: heart disease  - EKG 12-Lead  12. Medication management  - Urinalysis, Routine w reflex microscopic - Microalbumin / creatinine urine ratio - CBC with Differential/Platelet - COMPLETE METABOLIC PANEL WITH GFR - Magnesium - Lipid panel - TSH - Hemoglobin A1c - Insulin, random - VITAMIN D 25 Hydroxy         Patient was counseled in prudent diet to achieve/maintain BMI less than 25 for weight control, BP monitoring, regular exercise and medications. Discussed med's effects and SE's. Screening labs and tests as requested with regular follow-up as recommended. Over 40 minutes of exam, counseling, chart review and high complex critical decision making was performed.   Kirtland Bouchard, MD

## 2019-12-26 ENCOUNTER — Other Ambulatory Visit: Payer: Self-pay

## 2019-12-26 ENCOUNTER — Other Ambulatory Visit: Payer: Self-pay | Admitting: *Deleted

## 2019-12-26 ENCOUNTER — Ambulatory Visit: Payer: Medicare Other | Admitting: Internal Medicine

## 2019-12-26 VITALS — BP 138/76 | HR 71 | Temp 97.9°F | Resp 16 | Ht 65.0 in | Wt 166.0 lb

## 2019-12-26 DIAGNOSIS — E782 Mixed hyperlipidemia: Secondary | ICD-10-CM

## 2019-12-26 DIAGNOSIS — Z79899 Other long term (current) drug therapy: Secondary | ICD-10-CM

## 2019-12-26 DIAGNOSIS — Z0000 Encounter for general adult medical examination without abnormal findings: Secondary | ICD-10-CM | POA: Diagnosis not present

## 2019-12-26 DIAGNOSIS — N182 Chronic kidney disease, stage 2 (mild): Secondary | ICD-10-CM

## 2019-12-26 DIAGNOSIS — F325 Major depressive disorder, single episode, in full remission: Secondary | ICD-10-CM

## 2019-12-26 DIAGNOSIS — E559 Vitamin D deficiency, unspecified: Secondary | ICD-10-CM

## 2019-12-26 DIAGNOSIS — Z1211 Encounter for screening for malignant neoplasm of colon: Secondary | ICD-10-CM

## 2019-12-26 DIAGNOSIS — Z0001 Encounter for general adult medical examination with abnormal findings: Secondary | ICD-10-CM

## 2019-12-26 DIAGNOSIS — R7309 Other abnormal glucose: Secondary | ICD-10-CM

## 2019-12-26 DIAGNOSIS — R7303 Prediabetes: Secondary | ICD-10-CM

## 2019-12-26 DIAGNOSIS — Z136 Encounter for screening for cardiovascular disorders: Secondary | ICD-10-CM | POA: Diagnosis not present

## 2019-12-26 DIAGNOSIS — M159 Polyosteoarthritis, unspecified: Secondary | ICD-10-CM

## 2019-12-26 DIAGNOSIS — I10 Essential (primary) hypertension: Secondary | ICD-10-CM

## 2019-12-26 DIAGNOSIS — Z8249 Family history of ischemic heart disease and other diseases of the circulatory system: Secondary | ICD-10-CM

## 2019-12-26 MED ORDER — CITALOPRAM HYDROBROMIDE 40 MG PO TABS
ORAL_TABLET | ORAL | 1 refills | Status: DC
Start: 2019-12-26 — End: 2020-04-11

## 2019-12-26 MED ORDER — MELOXICAM 15 MG PO TABS: ORAL_TABLET | ORAL | 0 refills | Status: DC

## 2019-12-27 ENCOUNTER — Other Ambulatory Visit: Payer: Self-pay | Admitting: Internal Medicine

## 2019-12-27 DIAGNOSIS — N3000 Acute cystitis without hematuria: Secondary | ICD-10-CM

## 2019-12-27 LAB — URINALYSIS, ROUTINE W REFLEX MICROSCOPIC
Bilirubin Urine: NEGATIVE
Glucose, UA: NEGATIVE
Hgb urine dipstick: NEGATIVE
Hyaline Cast: NONE SEEN /LPF
Ketones, ur: NEGATIVE
Nitrite: NEGATIVE
Specific Gravity, Urine: 1.017 (ref 1.001–1.03)
pH: 6.5 (ref 5.0–8.0)

## 2019-12-27 LAB — COMPLETE METABOLIC PANEL WITH GFR
AG Ratio: 2.2 (calc) (ref 1.0–2.5)
ALT: 19 U/L (ref 6–29)
AST: 23 U/L (ref 10–35)
Albumin: 4.3 g/dL (ref 3.6–5.1)
Alkaline phosphatase (APISO): 81 U/L (ref 37–153)
BUN/Creatinine Ratio: 17 (calc) (ref 6–22)
BUN: 10 mg/dL (ref 7–25)
CO2: 29 mmol/L (ref 20–32)
Calcium: 10 mg/dL (ref 8.6–10.4)
Chloride: 102 mmol/L (ref 98–110)
Creat: 0.59 mg/dL — ABNORMAL LOW (ref 0.60–0.93)
GFR, Est African American: 101 mL/min/{1.73_m2} (ref >=60)
GFR, Est Non African American: 87 mL/min/{1.73_m2} (ref >=60)
Globulin: 2 g/dL (calc) (ref 1.9–3.7)
Glucose, Bld: 99 mg/dL (ref 65–99)
Potassium: 3.7 mmol/L (ref 3.5–5.3)
Sodium: 141 mmol/L (ref 135–146)
Total Bilirubin: 0.5 mg/dL (ref 0.2–1.2)
Total Protein: 6.3 g/dL (ref 6.1–8.1)

## 2019-12-27 LAB — CBC WITH DIFFERENTIAL/PLATELET
Absolute Monocytes: 562 cells/uL (ref 200–950)
Basophils Absolute: 62 cells/uL (ref 0–200)
Basophils Relative: 0.8 %
Eosinophils Absolute: 154 cells/uL (ref 15–500)
Eosinophils Relative: 2 %
HCT: 41.3 % (ref 35.0–45.0)
Hemoglobin: 14.2 g/dL (ref 11.7–15.5)
Lymphs Abs: 1448 cells/uL (ref 850–3900)
MCH: 33.3 pg — ABNORMAL HIGH (ref 27.0–33.0)
MCHC: 34.4 g/dL (ref 32.0–36.0)
MCV: 96.9 fL (ref 80.0–100.0)
MPV: 9.8 fL (ref 7.5–12.5)
Monocytes Relative: 7.3 %
Neutro Abs: 5475 cells/uL (ref 1500–7800)
Neutrophils Relative %: 71.1 %
Platelets: 231 10*3/uL (ref 140–400)
RBC: 4.26 10*6/uL (ref 3.80–5.10)
RDW: 11.9 % (ref 11.0–15.0)
Total Lymphocyte: 18.8 %
WBC: 7.7 10*3/uL (ref 3.8–10.8)

## 2019-12-27 LAB — HEMOGLOBIN A1C
Hgb A1c MFr Bld: 5.4 % of total Hgb (ref <5.7)
Mean Plasma Glucose: 108 (calc)
eAG (mmol/L): 6 (calc)

## 2019-12-27 LAB — MICROALBUMIN / CREATININE URINE RATIO
Creatinine, Urine: 135 mg/dL (ref 20–275)
Microalb Creat Ratio: 19 mcg/mg creat (ref <30)
Microalb, Ur: 2.6 mg/dL

## 2019-12-27 LAB — VITAMIN D 25 HYDROXY (VIT D DEFICIENCY, FRACTURES): Vit D, 25-Hydroxy: 56 ng/mL (ref 30–100)

## 2019-12-27 LAB — INSULIN, RANDOM: Insulin: 8.2 u[IU]/mL

## 2019-12-27 LAB — HM MAMMOGRAPHY

## 2019-12-27 LAB — HM DEXA SCAN

## 2019-12-27 LAB — LIPID PANEL
Cholesterol: 170 mg/dL (ref <200)
HDL: 66 mg/dL (ref >=50)
LDL Cholesterol (Calc): 84 mg/dL (calc)
Non-HDL Cholesterol (Calc): 104 mg/dL (calc) (ref <130)
Total CHOL/HDL Ratio: 2.6 (calc) (ref <5.0)
Triglycerides: 106 mg/dL (ref <150)

## 2019-12-27 LAB — TSH: TSH: 1.83 mIU/L (ref 0.40–4.50)

## 2019-12-27 LAB — MAGNESIUM: Magnesium: 2.2 mg/dL (ref 1.5–2.5)

## 2019-12-27 MED ORDER — CIPROFLOXACIN HCL 250 MG PO TABS: ORAL_TABLET | ORAL | 0 refills | Status: DC

## 2019-12-27 NOTE — Progress Notes (Signed)
========================================================== °========================================================== °-   Test results slightly outside the reference range are not unusual. If there is anything important, I will review this with you,  otherwise it is considered normal test values.  If you have further questions,  please do not hesitate to contact me at the office or via My Chart.  ==========================================================  -   U/A shows a UTI - Sent Rx to your pharmacy for an antibiotic to  take 2 x /day for 10 days  - Please call office to schedule a Nurse Visit in about 4 weeks to  recheck urine to be sure infection is cleared up ========================================================== ==========================================================  -  Total Chol = 170 and LDL Chol = 84 - Both  Excellent   - Very low risk for Heart Attack  / Stroke ==========================================================  - A1c - Normal -great - No Diabetes ==========================================================  -  Vitamin D = 56 - Slightly low - Recommend increase your  Vitamin D to 5,000 units /daily  ==========================================================  -  All Else - CBC - Kidneys - Electrolytes - Liver - Magnesium & Thyroid    - all  Normal / OK ===========================================================

## 2019-12-28 ENCOUNTER — Other Ambulatory Visit: Payer: Self-pay | Admitting: Internal Medicine

## 2019-12-28 ENCOUNTER — Other Ambulatory Visit: Payer: Self-pay | Admitting: Adult Health

## 2019-12-31 ENCOUNTER — Encounter: Payer: Self-pay | Admitting: *Deleted

## 2020-01-17 ENCOUNTER — Other Ambulatory Visit: Payer: Self-pay

## 2020-01-17 DIAGNOSIS — Z1211 Encounter for screening for malignant neoplasm of colon: Secondary | ICD-10-CM

## 2020-01-17 DIAGNOSIS — Z1212 Encounter for screening for malignant neoplasm of rectum: Secondary | ICD-10-CM

## 2020-01-17 LAB — POC HEMOCCULT BLD/STL (HOME/3-CARD/SCREEN)
Card #2 Fecal Occult Blod, POC: NEGATIVE
Card #3 Fecal Occult Blood, POC: NEGATIVE
Fecal Occult Blood, POC: NEGATIVE

## 2020-01-24 ENCOUNTER — Other Ambulatory Visit: Payer: Self-pay | Admitting: Internal Medicine

## 2020-01-24 DIAGNOSIS — M159 Polyosteoarthritis, unspecified: Secondary | ICD-10-CM

## 2020-02-06 ENCOUNTER — Other Ambulatory Visit: Payer: Self-pay | Admitting: Internal Medicine

## 2020-02-06 DIAGNOSIS — I10 Essential (primary) hypertension: Secondary | ICD-10-CM

## 2020-02-10 ENCOUNTER — Other Ambulatory Visit: Payer: Self-pay | Admitting: Internal Medicine

## 2020-02-15 ENCOUNTER — Other Ambulatory Visit: Payer: Self-pay | Admitting: Adult Health

## 2020-02-19 ENCOUNTER — Ambulatory Visit (INDEPENDENT_AMBULATORY_CARE_PROVIDER_SITE_OTHER): Payer: Medicare Other | Admitting: Orthopaedic Surgery

## 2020-02-19 ENCOUNTER — Other Ambulatory Visit: Payer: Self-pay

## 2020-02-19 DIAGNOSIS — G8929 Other chronic pain: Secondary | ICD-10-CM

## 2020-02-19 DIAGNOSIS — M545 Low back pain, unspecified: Secondary | ICD-10-CM

## 2020-02-19 MED ORDER — PENNSAID 2 % EX SOLN: 2.0000 g | Freq: Two times a day (BID) | CUTANEOUS | 3 refills | Status: DC | PRN

## 2020-02-19 NOTE — Progress Notes (Signed)
Office Visit Note   Patient: Kelsey Lopez           Date of Birth: 04-Oct-1940           MRN: [id number] Visit Date: 02/19/2020              Requested by: Unk Pinto, [address] [place],  [place] [address] PCP: Unk Pinto, MD   Assessment & Plan: Visit Diagnoses:  1. Chronic left-sided low back pain without sciatica     Plan: Impression is improved left-sided low back pain. Refill prescription for Pennsaid ointment. She will take NSAIDs very sparingly only during severe pain. She will use heat and muscle lectures as needed. Follow-up as needed.  Follow-Up Instructions: Return if symptoms worsen or fail to improve.   Orders:  No orders of the defined types were placed in this encounter.  Meds ordered this encounter  Medications  . Diclofenac Sodium (PENNSAID) 2 % SOLN    Sig: Apply 2 g topically 2 (two) times daily as needed (to affected area).    Dispense:  112 g    Refill:  3      Procedures: No procedures performed   Clinical Data: No additional findings.   Subjective: Chief Complaint  Patient presents with  . Lower Back - Pain    Kelsey Lopez comes back today for chronic left-sided low back pain. This is actually gotten better since last week. Denies any radiculopathy.   Review of Systems   Objective: Vital Signs: There were no vitals taken for this visit.  Physical Exam  Ortho Exam Lumbar spine exam is nonfocal. She has some tenderness to the left paraspinous muscles. No focal motor or sensory deficits. Specialty Comments:  No specialty comments available.  Imaging: No results found.   PMFS History: Patient Active Problem List   Diagnosis Date Noted  . Unilateral primary osteoarthritis, left knee 05/04/2019  . Unilateral primary osteoarthritis, right knee 05/04/2019  . COPD (chronic obstructive pulmonary disease) with chronic bronchitis ([place]) 02/26/2019  . Stage 3 chronic kidney disease ([place]) 01/14/2017  .  Depression, major, in remission ([place]) 11/27/2014  . Overweight (BMI 25.0-29.9) 04/10/2014  . Insomnia 04/10/2014  . Vitamin D deficiency 01/07/2014  . Abnormal glucose 01/03/2014  . Medication management 01/03/2014  . Anxiety   . Morton's neuroma of left foot   . Osteoarthritis   . Hyperlipidemia, mixed 06/05/2009  . Essential hypertension 06/05/2009   Past Medical History:  Diagnosis Date  . Anxiety   . Dyslipidemia    on Rx x20 years  . HTN (hypertension)   . Hyperlipidemia   . Insomnia   . Morton's neuroma of left foot   . Multiple allergies   . Osteoarthritis   . Polycythemia   . PVC (premature ventricular contraction)     Family History  Problem Relation Age of Onset  . Osteoarthritis Mother   . Heart attack Mother   . Heart disease Mother   . Cancer Father        lung  . Osteoarthritis Father   . Hyperlipidemia Brother   . Diabetes Brother     Past Surgical History:  Procedure Laterality Date  . ABDOMINAL HYSTERECTOMY    . CATARACT EXTRACTION Right 2019   Dr. Ellie Lunch   . KNEE ARTHROSCOPY     R  . TONSILLECTOMY    . VESICOVAGINAL FISTULA CLOSURE W/ TAH     Social History   Occupational History  . Not on file  Tobacco Use  . Smoking status: Never Smoker  . Smokeless tobacco: Never Used  . Tobacco comment: no tobacco   Substance and Sexual Activity  . Alcohol use: Yes    Comment: rare  . Drug use: Not on file  . Sexual activity: Not on file

## 2020-04-11 ENCOUNTER — Other Ambulatory Visit: Payer: Self-pay | Admitting: Adult Health

## 2020-04-11 ENCOUNTER — Other Ambulatory Visit: Payer: Self-pay | Admitting: Internal Medicine

## 2020-04-11 DIAGNOSIS — F419 Anxiety disorder, unspecified: Secondary | ICD-10-CM

## 2020-04-11 DIAGNOSIS — M159 Polyosteoarthritis, unspecified: Secondary | ICD-10-CM

## 2020-04-15 DIAGNOSIS — M8580 Other specified disorders of bone density and structure, unspecified site: Secondary | ICD-10-CM | POA: Insufficient documentation

## 2020-04-15 NOTE — Progress Notes (Signed)
Patient ID: Kelsey Lopez, female   DOB: Aug 12, 1940, 80 y.o.   MRN: [id number] MEDICARE ANNUAL WELLNESS VISIT AND 3 month  Assessment:    Encounter for annual medicare wellness visit Repeat in 1 year  Essential hypertension - continue medications, DASH diet, exercise and monitor at home. Call if greater than 130/80.  - CMP/GFR - TSH   Other abnormal glucose Discussed general issues about diabetes pathophysiology and management., Educational material distributed., Suggested low cholesterol diet., Encouraged aerobic exercise., Discussed foot care., Reminded to get yearly retinal exam.  Mixed hyperlipidemia -continue medications, check lipids, decrease fatty foods, increase activity.  - Lipid panel   Depression/ Anxiety Well managed by current regimen; will try celexa 20 mg  Stress management techniques discussed, increase water, good sleep hygiene discussed, increase exercise, and increase veggies.   Medication management - Magnesium   Vitamin D deficiency - Vit D  25 hydroxy (rtn osteoporosis monitoring)  Osteoarthritis, unspecified osteoarthritis type, unspecified site RICE, NSAIDS, followed by Dr. Erlinda Hong  OVERWEIGHT Obesity with co morbidities- long discussion about weight loss, diet, and exercise   Insomnia Insomnia- good sleep hygiene discussed, increase day time activity - continue trazodone  Stage 2 CKD Increase fluids, avoid NSAIDS, monitor sugars, will monitor  Osteopenia  - get dexa 12/2021, continue Vit D and Ca, encouraged weight bearing exercises   Future Appointments  Date Time Provider [place]  07/22/2020  2:30 PM Unk Pinto, MD [facility] None  01/12/2021 10:00 AM Unk Pinto, MD [facility] None  04/16/2021  2:30 PM Liane Comber, NP [facility] None      Plan:   During the course of the visit the patient was educated and counseled about appropriate screening and preventive services including:    Pneumococcal vaccine   Influenza  vaccine  Td vaccine  Screening electrocardiogram  Screening mammography  Bone densitometry screening  Colorectal cancer screening  Diabetes screening  Glaucoma screening  Nutrition counseling   Advanced directives: given information/requested   Subjective:   Kelsey Lopez is a 80 y.o. female who presents for Medicare Annual Wellness Visit and 3 month follow up for HTN, chol, and preDM.     She has hx of second hand smoke exposure, prone to bronchitis, normal CXRs, no problems since on Singulair, zyrtec and mucinex.   She is on celexa for anxiety, started after her husband passed 9 years ago and continues to perceive benefit, she is on xanax PRN (takes 1/2 tab once daily if still having difficulty sleeping) and she is on trazodone 150 mg daily at night.   Following with Dr. Erlinda Hong for knee pain, bone on bone, degenerative scoliosis, doesn't want surgery, PT helped and doing well with occasional injections.   BMI is Body mass index is 28.79 kg/m., she has been working on diet, exercise limited by bil knee arthritis.  Wt Readings from Last 3 Encounters:  04/16/20 173 lb (78.5 kg)  12/26/19 166 lb (75.3 kg)  05/30/19 168 lb (76.2 kg)   Her blood pressure has been controlled at home, today their BP is BP: 114/78 She does not workout. She denies chest pain, shortness of breath, dizziness.   She is on cholesterol medication (simvastatin 40 mg daily) and denies myalgias. Her cholesterol is at goal. The cholesterol last visit was:   Lab Results  Component Value Date   CHOL 170 12/26/2019   HDL 66 12/26/2019   LDLCALC 84 12/26/2019   TRIG 106 12/26/2019   CHOLHDL 2.6 12/26/2019   Last A1C in  the office was:  Lab Results  Component Value Date   HGBA1C 5.4 12/26/2019    She has CKD II monitored at this office; last GFR:  Lab Results  Component Value Date   GFRNONAA 87 12/26/2019   Patient is on Vitamin D supplement, 2000 IU.   Lab Results  Component Value Date   VD25OH  56 12/26/2019         Medication Review   Current Outpatient Medications (Cardiovascular):  .  hydrochlorothiazide (HYDRODIURIL) 12.5 MG tablet, TAKE 1 TABLET DAILY FOR BLOOD PRESSURE & FLUID RETENTION / ANKLE SWELLING .  losartan (COZAAR) 50 MG tablet, TAKE 1 TABLET DAILY FOR BLOOD PRESSURE .  simvastatin (ZOCOR) 40 MG tablet, TAKE 1 TABLET BY MOUTH EVERYDAY AT BEDTIME  Current Outpatient Medications (Respiratory):  .  cetirizine (ZYRTEC) 10 MG tablet, Take 10 mg by mouth as needed. .  montelukast (SINGULAIR) 10 MG tablet, Take     1 tablet     Daily       for Allergies  Current Outpatient Medications (Analgesics):  .  aspirin 81 MG EC tablet, Take 81 mg by mouth daily. .  meloxicam (MOBIC) 15 MG tablet, TAKE 1/2 TO 1 TABLET DAILY WITH FOOD FOR PAIN & INFLAMMATION   Current Outpatient Medications (Other):  Marland Kitchen  ALPRAZolam (XANAX) 0.5 MG tablet, TAKE 1/2 - 1 TABLET AT BEDTIME ONLY IF NEEDED FOR SLEEP & LIMIT TO 5 DAYS /WEEK TO AVOID ADDICTION & DEMENTIA .  Cholecalciferol (VITAMIN D3) 2000 UNITS capsule, Take 2,000 Units by mouth daily. Marland Kitchen  conjugated estrogens (PREMARIN) vaginal cream, Place 1 Applicatorful vaginally. Use once every week .  Flaxseed, Linseed, (FLAX SEED OIL) 1000 MG CAPS, Take 1,000 mg by mouth daily. .  Magnesium 400 MG CAPS, Take 400 mg by mouth daily. .  Multiple Vitamin (MULTIVITAMIN) tablet, Take 1 tablet by mouth daily. .  NON FORMULARY, Calcium 1800 mg 1 tablet daily .  OVER THE COUNTER MEDICATION, Otc Mucinex 1 tab daily .  tiZANidine (ZANAFLEX) 4 MG tablet, Take 1 tablet (4 mg total) by mouth every 8 (eight) hours as needed for muscle spasms. .  traZODone (DESYREL) 150 MG tablet, TAKE 1/2 TO 1 TABLET 1 HOUR BEFORE BEDTIME IF NEEDED FOR SLEEP .  vitamin C (ASCORBIC ACID) 500 MG tablet, Take 500 mg by mouth daily. .  citalopram (CELEXA) 20 MG tablet, TAKE 1 TABLET BY MOUTH DAILY FOR MOOD  Current Problems (verified) Patient Active Problem List   Diagnosis  Date Noted  . Osteopenia 04/15/2020  . Unilateral primary osteoarthritis, left knee 05/04/2019  . Unilateral primary osteoarthritis, right knee 05/04/2019  . CKD (chronic kidney disease) stage 2, GFR 60-89 ml/min 01/14/2017  . Depression, major, in remission ([place]) 11/27/2014  . Overweight (BMI 25.0-29.9) 04/10/2014  . Insomnia 04/10/2014  . Vitamin D deficiency 01/07/2014  . Abnormal glucose 01/03/2014  . Medication management 01/03/2014  . Anxiety   . Morton's neuroma of left foot   . Osteoarthritis   . Hyperlipidemia, mixed 06/05/2009  . Essential hypertension 06/05/2009    Immunization History  Administered Date(s) Administered  . Influenza Split 11/15/2011, 11/28/2012, 10/31/2013, 01/07/2014  . Influenza, High Dose Seasonal PF 11/27/2014, 11/28/2015, 09/29/2016, 10/30/2017, 10/01/2018, 10/23/2019  . Influenza-Unspecified 09/29/2016, 10/01/2018  . PFIZER(Purple Top)SARS-COV-2 Vaccination 03/02/2019, 03/23/2019, 11/20/2019  . Pneumococcal Conjugate-13 10/02/2013  . Pneumococcal Polysaccharide-23 08/03/2017  . Tdap 08/01/2013  . Zoster 05/25/2011  . Zoster Recombinat (Shingrix) 11/10/2016, 01/10/2017    Preventative care: Last colonoscopy: 03/08/2018 DONE  Last mammogram: 12/2019 Last pap smear/pelvic exam: 2007  DEXA: 12/2019, L fem T - 2.3,   Prior vaccinations: TDAP: 2015  Influenza: 2021 Pneumococcal: 2004, 2019 Prevnar 13: 2015 Shingles/Zostavax:  2019 Covid 19: 3/3, 2021, pfizer  Dr. Ellie Lunch, (Eye), last visit 2022 [place], (Dentist), 2021, goes q5m  Patient Care Team: Unk Pinto, MD as PCP - General (Internal Medicine) Druscilla Brownie, MD as Consulting Physician (Dermatology) Clarene Essex, MD as Consulting Physician (Gastroenterology) Fay Records, MD as Consulting Physician (Cardiology) Leandrew Koyanagi, MD as Attending Physician (Orthopedic Surgery)   Allergies Allergies  Allergen Reactions  . Ace Inhibitors     Cough  . Ampicillin     rash  .  Mevacor [Lovastatin]     Elevated LFT's  . Penicillins     REACTION: hives and  itching  . Sulfa Antibiotics     rash    SURGICAL HISTORY She  has a past surgical history that includes Tonsillectomy; Vesicovaginal fistula closure w/ TAH; Knee arthroscopy; Abdominal hysterectomy; and Cataract extraction (Right, 2019). FAMILY HISTORY Her family history includes Cancer in her father; Diabetes in her brother; Heart attack in her mother; Heart disease in her mother; Hyperlipidemia in her brother; Osteoarthritis in her father and mother. SOCIAL HISTORY She  reports that she has never smoked. She has never used smokeless tobacco. She reports current alcohol use.  Physical activity: Current Exercise Habits: The patient does not participate in regular exercise at present, Exercise limited by: orthopedic condition(s) Cardiac risk factors: Cardiac Risk Factors include: advanced age (>75men, >53 women);dyslipidemia;hypertension;sedentary lifestyle Depression/mood screen:   Depression screen [facility] 2/9 04/16/2020  Decreased Interest 0  Down, Depressed, Hopeless 0  PHQ - 2 Score 0  Altered sleeping 0  Tired, decreased energy 0  Change in appetite 0  Feeling bad or failure about yourself  0  Trouble concentrating 0  Moving slowly or fidgety/restless 0  Suicidal thoughts 0  PHQ-9 Score 0  Difficult doing work/chores Not difficult at all    ADLs:  In your present state of health, do you have any difficulty performing the following activities: 04/16/2020 12/25/2019  Hearing? N N  Vision? N N  Difficulty concentrating or making decisions? N N  Walking or climbing stairs? N N  Comment can manage slowly -  Dressing or bathing? N N  Doing errands, shopping? N N  Some recent data might be hidden     Cognitive Testing  Alert? Yes  Normal Appearance?Yes  Oriented to person? Yes  Place? Yes   Time? Yes  Recall of three objects?  Yes  Can perform simple calculations? Yes  Displays appropriate  judgment?Yes  Can read the correct time from a watch face?Yes  EOL planning: Does Patient Have a Medical Advance Directive?: Yes Type of Advance Directive: [place] will Does patient want to make changes to medical advance directive?: No - Patient declined Copy of [place] in Chart?: No - copy requested   Objective:     Blood pressure 114/78, pulse (!) 55, temperature (!) 97.3 F (36.3 C), weight 173 lb (78.5 kg), SpO2 96 %. Body mass index is 28.79 kg/m.  General appearance: alert, no distress, WD/WN,  female HEENT: normocephalic, sclerae anicteric, TMs pearly, nares patent, no discharge or erythema, pharynx normal Oral cavity: MMM, no lesions Neck: supple, no lymphadenopathy, no thyromegaly, no masses Heart: RRR, normal S1, S2, no murmurs Lungs: CTA bilaterally, no wheezes, rhonchi, or rales Abdomen: +bs, soft, non tender, non  distended, no masses, no hepatomegaly, no splenomegaly Musculoskeletal: nontender, no swelling, mild scoliosis Extremities: no edema, no cyanosis, no clubbing Pulses: 2+ symmetric, upper and lower extremities, normal cap refill Neurological: alert, oriented x 3, CN2-12 intact, strength normal upper extremities and lower extremities, sensation normal throughout, DTRs 2+ throughout, no cerebellar signs, gait slow steady Skin: erythematous rash around bilateral eyes Psychiatric: normal affect, behavior normal, pleasant  Breast:  defer Rectal: defer   Medicare Attestation I have personally reviewed: The patient's medical and social history Their use of alcohol, tobacco or illicit drugs Their current medications and supplements The patient's functional ability including ADLs,fall risks, home safety risks, cognitive, and hearing and visual impairment Diet and physical activities Evidence for depression or mood disorders  The patient's weight, height, BMI, and visual acuity have been recorded in the chart.  I  have made referrals, counseling, and provided education to the patient based on review of the above and I have provided the patient with a written personalized care plan for preventive services.     Kelsey Ribas, NP   04/16/2020

## 2020-04-16 ENCOUNTER — Encounter: Payer: Self-pay | Admitting: Adult Health

## 2020-04-16 ENCOUNTER — Other Ambulatory Visit: Payer: Self-pay

## 2020-04-16 ENCOUNTER — Ambulatory Visit: Payer: Medicare Other | Admitting: Adult Health

## 2020-04-16 VITALS — BP 114/78 | HR 55 | Temp 97.3°F | Wt 173.0 lb

## 2020-04-16 DIAGNOSIS — I10 Essential (primary) hypertension: Secondary | ICD-10-CM

## 2020-04-16 DIAGNOSIS — G4700 Insomnia, unspecified: Secondary | ICD-10-CM

## 2020-04-16 DIAGNOSIS — F325 Major depressive disorder, single episode, in full remission: Secondary | ICD-10-CM | POA: Diagnosis not present

## 2020-04-16 DIAGNOSIS — R7309 Other abnormal glucose: Secondary | ICD-10-CM

## 2020-04-16 DIAGNOSIS — Z79899 Other long term (current) drug therapy: Secondary | ICD-10-CM

## 2020-04-16 DIAGNOSIS — F419 Anxiety disorder, unspecified: Secondary | ICD-10-CM

## 2020-04-16 DIAGNOSIS — E559 Vitamin D deficiency, unspecified: Secondary | ICD-10-CM

## 2020-04-16 DIAGNOSIS — Z0000 Encounter for general adult medical examination without abnormal findings: Secondary | ICD-10-CM

## 2020-04-16 DIAGNOSIS — N182 Chronic kidney disease, stage 2 (mild): Secondary | ICD-10-CM | POA: Diagnosis not present

## 2020-04-16 DIAGNOSIS — J449 Chronic obstructive pulmonary disease, unspecified: Secondary | ICD-10-CM

## 2020-04-16 DIAGNOSIS — R6889 Other general symptoms and signs: Secondary | ICD-10-CM | POA: Diagnosis not present

## 2020-04-16 DIAGNOSIS — M8589 Other specified disorders of bone density and structure, multiple sites: Secondary | ICD-10-CM

## 2020-04-16 DIAGNOSIS — M1990 Unspecified osteoarthritis, unspecified site: Secondary | ICD-10-CM

## 2020-04-16 DIAGNOSIS — E663 Overweight: Secondary | ICD-10-CM

## 2020-04-16 DIAGNOSIS — Z0001 Encounter for general adult medical examination with abnormal findings: Secondary | ICD-10-CM | POA: Diagnosis not present

## 2020-04-16 DIAGNOSIS — E782 Mixed hyperlipidemia: Secondary | ICD-10-CM

## 2020-04-16 MED ORDER — CITALOPRAM HYDROBROMIDE 20 MG PO TABS: ORAL_TABLET | ORAL | 1 refills | Status: DC

## 2020-04-16 NOTE — Patient Instructions (Addendum)
Kelsey Lopez , Thank you for taking time to come for your Medicare Wellness Visit. I appreciate your ongoing commitment to your health goals. Please review the following plan we discussed and let me know if I can assist you in the future.   These are the goals we discussed: Goals    . Exercise 2-3x per week     Suggest walking in pool or pilates        This is a list of the screening recommended for you and due dates:  Health Maintenance  Topic Date Due  . Mammogram  12/26/2020  . Tetanus Vaccine  08/02/2023  . Flu Shot  Completed  . DEXA scan (bone density measurement)  Completed  . COVID-19 Vaccine  Completed  . Pneumonia vaccines  Completed  . HPV Vaccine  Aged Out  .  Hepatitis C: One time screening is recommended by Center for Disease Control  (CDC) for  adults born from [birth year] through 1965.   Discontinued       Preventing Osteoporosis, Adult Osteoporosis is a condition that causes the bones to lose density. This means that the bones become thinner, and the normal spaces in bone tissue become larger. Low bone density can make the bones weak and cause them to break more easily. Osteoporosis cannot always be prevented, but you can take steps to lower your risk of developing this condition. How can this condition affect me? If you develop osteoporosis, you will be more likely to break bones in your wrist, spine, or hip. Even a minor accident or injury can be enough to break weak bones. The bones will also be slower to heal. Osteoporosis can cause other problems as well, such as a stooped posture or trouble with movement. Osteoporosis can occur with aging. As you get older, you may lose bone tissue more quickly, or it may be replaced more slowly. Osteoporosis is more likely to develop if you have poor nutrition or do not get enough calcium or vitamin D. Other lifestyle factors can also play a role. By eating a well-balanced diet and making lifestyle changes, you can help keep your bones  strong and healthy, lowering your chances of developing osteoporosis. What can increase my risk? The following factors may make you more likely to develop osteoporosis:  Having a family history of the condition.  Having poor nutrition or not getting enough calcium or vitamin D.  Using certain medicines, such as steroid medicines or anti-seizure medicines.  Being any of the following: ? 51 years of age or older. ? Female. ? A woman who has gone through menopause (is postmenopausal). ? A person who is of European or Asian descent.  Using products that contain nicotine or tobacco, such as cigarettes, e-cigarettes, and chewing tobacco.  Not being physically active (being sedentary).  Having a small body frame. What actions can I take to prevent this? Get enough calcium  Make sure you get enough calcium every day. Calcium is the most important mineral for bone health. Most people can get enough calcium from their diet, but supplements may be recommended for people who are at risk for osteoporosis. Follow these guidelines: ? If you are age 26 or younger, aim to get 1,000 milligrams (mg) of calcium every day. ? If you are older than age 48, aim to get 1,200 mg of calcium every day.  Good sources of calcium include: ? Dairy products, such as low-fat or nonfat milk, cheese, and yogurt. ? Dark green leafy vegetables, such as  bok choy and broccoli. ? Foods that have had calcium added to them (calcium-fortified foods), such as orange juice, cereal, bread, soy beverages, and tofu products. ? Nuts, such as almonds.  Check nutrition labels to see how much calcium is in a food or drink.   Get enough vitamin D  Try to get enough vitamin D every day. Vitamin D is the most essential vitamin for bone health. It helps the body absorb calcium. Follow these guidelines for how much vitamin D to get from food: ? If you are age 2 or younger, aim to get at least 600 international units (IU) every day.  Your health care provider may suggest more. ? If you are older than age 52, aim to get at least 800 international units every day. Your health care provider may suggest more.  Good sources of vitamin D in your diet include: ? Egg yolks. ? Oily fish, such as salmon, sardines, and tuna. ? Milk and cereal fortified with vitamin D.  Your body also makes vitamin D when you are out in the sun. Exposing the bare skin on your face, arms, legs, or back to the sun for no more than 30 minutes a day, 2 times a week is more than enough. Beyond that, make sure you use sunblock to protect your skin from sunburn, which increases your risk for skin cancer. Exercise  Stay active and get exercise every day.  Ask your health care provider what types of exercise are best for you. Weight-bearing and strength-building activities are important for building and maintaining healthy bones. Some examples of these types of activities include: ? Walking and hiking. ? Jogging and running. ? Dancing. ? Gym exercises and lifting weights. ? Tennis and racquetball. ? Climbing stairs. ? Tai chi.   Make other lifestyle changes  Do not use any products that contain nicotine or tobacco, such as cigarettes, e-cigarettes, and chewing tobacco. If you need help quitting, ask your health care provider.  Lose weight if you are overweight.  If you drink alcohol: ? Limit how much you use to:  0-1 drink a day for women who are not pregnant.  0-2 drinks a day for men. ? Be aware of how much alcohol is in your drink. In the [place] one drink equals one 12 oz bottle of beer (355 mL), one 5 oz glass of wine (148 mL), or one 1 oz glass of hard liquor (44 mL). Where to find support If you need help making changes to prevent osteoporosis, talk with your health care provider. You can ask for a referral to a dietitian and a physical therapist. Where to find more information Learn more about osteoporosis from:  NIH Osteoporosis and  Related [place]: www.bones.SouthExposed.es  [place] Office on [employer] Health: [place].tn  [place]: EquipmentWeekly.com.ee Summary  Osteoporosis is a condition that causes weak bones that are more likely to break.  Eat a healthy diet, making sure you get enough calcium and vitamin D, and stay active by getting regular exercise to help prevent osteoporosis.  Other ways to reduce your risk of osteoporosis include maintaining a healthy weight and avoiding alcohol and products that contain nicotine or tobacco. This information is not intended to replace advice given to you by your health care provider. Make sure you discuss any questions you have with your health care provider. Document Revised: 07/12/2019 Document Reviewed: 07/12/2019 Elsevier Patient Education  [place].

## 2020-04-17 LAB — CBC WITH DIFFERENTIAL/PLATELET
Absolute Monocytes: 630 cells/uL (ref 200–950)
Basophils Absolute: 49 cells/uL (ref 0–200)
Basophils Relative: 0.7 %
Eosinophils Absolute: 441 cells/uL (ref 15–500)
Eosinophils Relative: 6.3 %
HCT: 39.5 % (ref 35.0–45.0)
Hemoglobin: 13.3 g/dL (ref 11.7–15.5)
Lymphs Abs: 1561 cells/uL (ref 850–3900)
MCH: 31.9 pg (ref 27.0–33.0)
MCHC: 33.7 g/dL (ref 32.0–36.0)
MCV: 94.7 fL (ref 80.0–100.0)
MPV: 10.2 fL (ref 7.5–12.5)
Monocytes Relative: 9 %
Neutro Abs: 4319 cells/uL (ref 1500–7800)
Neutrophils Relative %: 61.7 %
Platelets: 187 10*3/uL (ref 140–400)
RBC: 4.17 10*6/uL (ref 3.80–5.10)
RDW: 12.5 % (ref 11.0–15.0)
Total Lymphocyte: 22.3 %
WBC: 7 10*3/uL (ref 3.8–10.8)

## 2020-04-17 LAB — COMPLETE METABOLIC PANEL WITH GFR
AG Ratio: 2.1 (calc) (ref 1.0–2.5)
ALT: 17 U/L (ref 6–29)
AST: 22 U/L (ref 10–35)
Albumin: 4.1 g/dL (ref 3.6–5.1)
Alkaline phosphatase (APISO): 75 U/L (ref 37–153)
BUN: 16 mg/dL (ref 7–25)
CO2: 33 mmol/L — ABNORMAL HIGH (ref 20–32)
Calcium: 10.2 mg/dL (ref 8.6–10.4)
Chloride: 100 mmol/L (ref 98–110)
Creat: 0.83 mg/dL (ref 0.60–0.93)
GFR, Est African American: 78 mL/min/{1.73_m2} (ref >=60)
GFR, Est Non African American: 67 mL/min/{1.73_m2} (ref >=60)
Globulin: 2 g/dL (calc) (ref 1.9–3.7)
Glucose, Bld: 94 mg/dL (ref 65–99)
Potassium: 4.3 mmol/L (ref 3.5–5.3)
Sodium: 139 mmol/L (ref 135–146)
Total Bilirubin: 0.5 mg/dL (ref 0.2–1.2)
Total Protein: 6.1 g/dL (ref 6.1–8.1)

## 2020-04-17 LAB — TSH: TSH: 3.21 mIU/L (ref 0.40–4.50)

## 2020-04-17 LAB — LIPID PANEL
Cholesterol: 164 mg/dL (ref <200)
HDL: 67 mg/dL (ref >=50)
LDL Cholesterol (Calc): 80 mg/dL (calc)
Non-HDL Cholesterol (Calc): 97 mg/dL (calc) (ref <130)
Total CHOL/HDL Ratio: 2.4 (calc) (ref <5.0)
Triglycerides: 90 mg/dL (ref <150)

## 2020-04-17 LAB — MAGNESIUM: Magnesium: 2.1 mg/dL (ref 1.5–2.5)

## 2020-05-07 ENCOUNTER — Other Ambulatory Visit: Payer: Self-pay | Admitting: Internal Medicine

## 2020-05-07 ENCOUNTER — Other Ambulatory Visit: Payer: Self-pay | Admitting: Physician Assistant

## 2020-05-13 ENCOUNTER — Telehealth: Payer: Self-pay | Admitting: Orthopaedic Surgery

## 2020-05-13 MED ORDER — TIZANIDINE HCL 4 MG PO TABS
4.0000 mg | ORAL_TABLET | Freq: Three times a day (TID) | ORAL | 6 refills | Status: DC | PRN
Start: 2020-05-13 — End: 2021-03-30

## 2020-05-13 NOTE — Telephone Encounter (Signed)
Patient called and refill of Tizanidine. Please contact pharmacy for refill. Patient states to need as soon as possible. Please send to pharmacy on file. Patient phone number is [phone].

## 2020-05-13 NOTE — Telephone Encounter (Signed)
Done.  Please send all future med refills to [facility].  Thanks.

## 2020-05-13 NOTE — Telephone Encounter (Signed)
Please advise 

## 2020-05-26 ENCOUNTER — Other Ambulatory Visit: Payer: Self-pay | Admitting: Adult Health

## 2020-05-26 DIAGNOSIS — M159 Polyosteoarthritis, unspecified: Secondary | ICD-10-CM

## 2020-06-05 ENCOUNTER — Telehealth: Payer: Self-pay

## 2020-06-05 ENCOUNTER — Other Ambulatory Visit: Payer: Self-pay | Admitting: Adult Health

## 2020-06-05 DIAGNOSIS — R059 Cough, unspecified: Secondary | ICD-10-CM

## 2020-06-05 MED ORDER — PROMETHAZINE-DM 6.25-15 MG/5ML PO SYRP: 5.0000 mL | ORAL_SOLUTION | Freq: Four times a day (QID) | ORAL | 1 refills | Status: DC | PRN

## 2020-06-05 MED ORDER — PREDNISONE 20 MG PO TABS
ORAL_TABLET | ORAL | 0 refills | Status: DC
Start: 2020-06-05 — End: 2021-01-13

## 2020-06-05 NOTE — Telephone Encounter (Signed)
Feels like its in her chest. Would you recommend a chest xray because of history with Pneumonia and cough syrup?

## 2020-06-05 NOTE — Telephone Encounter (Signed)
Patient states that her symptoms include, chest congestion, nasal congestion and an uncontrollable nasal cough. Has been taking OTC Nasal decongestant, Mucinex, Sudafed, Nasal Spray and Severe Cough and Cold at nighttime. Still taking the Zyrtec and Singulair. Please advise.

## 2020-06-05 NOTE — Telephone Encounter (Signed)
States that she has been coughing for a week, had a negative covid result on 03/22. Requesting a prescription for allergies. Has Zyrtec and Singulair

## 2020-06-06 ENCOUNTER — Ambulatory Visit
Admission: RE | Admit: 2020-06-06 | Discharge: 2020-06-06 | Disposition: A | Discharge status: Home / Self Care | Payer: Medicare Other | Source: Ambulatory Visit | Attending: Adult Health | Admitting: Adult Health

## 2020-06-06 ENCOUNTER — Other Ambulatory Visit: Payer: Self-pay

## 2020-06-06 DIAGNOSIS — R059 Cough, unspecified: Secondary | ICD-10-CM

## 2020-07-10 ENCOUNTER — Telehealth: Payer: Self-pay

## 2020-07-10 ENCOUNTER — Other Ambulatory Visit: Payer: Self-pay | Admitting: Adult Health

## 2020-07-10 DIAGNOSIS — R300 Dysuria: Secondary | ICD-10-CM

## 2020-07-10 NOTE — Telephone Encounter (Signed)
Possibly has a UTI and is wanting to come in but there aren't any appointments available.

## 2020-07-11 ENCOUNTER — Emergency Department (HOSPITAL_COMMUNITY)
Admission: EM | Admit: 2020-07-11 | Discharge: 2020-07-11 | Disposition: A | Discharge status: Home / Self Care | Payer: Medicare Other | Attending: Emergency Medicine | Admitting: Emergency Medicine

## 2020-07-11 ENCOUNTER — Ambulatory Visit (INDEPENDENT_AMBULATORY_CARE_PROVIDER_SITE_OTHER): Payer: Medicare Other

## 2020-07-11 ENCOUNTER — Other Ambulatory Visit: Payer: Self-pay

## 2020-07-11 ENCOUNTER — Emergency Department (HOSPITAL_COMMUNITY): Payer: Medicare Other

## 2020-07-11 VITALS — BP 110/64 | HR 172 | Temp 97.5°F | Wt 166.0 lb

## 2020-07-11 DIAGNOSIS — R000 Tachycardia, unspecified: Secondary | ICD-10-CM

## 2020-07-11 DIAGNOSIS — I471 Supraventricular tachycardia: Secondary | ICD-10-CM | POA: Diagnosis not present

## 2020-07-11 DIAGNOSIS — R300 Dysuria: Secondary | ICD-10-CM

## 2020-07-11 DIAGNOSIS — N390 Urinary tract infection, site not specified: Secondary | ICD-10-CM | POA: Insufficient documentation

## 2020-07-11 DIAGNOSIS — Z79899 Other long term (current) drug therapy: Secondary | ICD-10-CM | POA: Diagnosis not present

## 2020-07-11 DIAGNOSIS — Z7982 Long term (current) use of aspirin: Secondary | ICD-10-CM | POA: Diagnosis not present

## 2020-07-11 DIAGNOSIS — N182 Chronic kidney disease, stage 2 (mild): Secondary | ICD-10-CM | POA: Insufficient documentation

## 2020-07-11 DIAGNOSIS — I129 Hypertensive chronic kidney disease with stage 1 through stage 4 chronic kidney disease, or unspecified chronic kidney disease: Secondary | ICD-10-CM | POA: Diagnosis not present

## 2020-07-11 LAB — BASIC METABOLIC PANEL
Anion gap: 8 (ref 5–15)
BUN: 6 mg/dL — ABNORMAL LOW (ref 8–23)
CO2: 24 mmol/L (ref 22–32)
Calcium: 9.4 mg/dL (ref 8.9–10.3)
Chloride: 105 mmol/L (ref 98–111)
Creatinine, Ser: 0.63 mg/dL (ref 0.44–1.00)
GFR, Estimated: >60 mL/min (ref >60)
Glucose, Bld: 101 mg/dL — ABNORMAL HIGH (ref 70–99)
Potassium: 3.5 mmol/L (ref 3.5–5.1)
Sodium: 137 mmol/L (ref 135–145)

## 2020-07-11 LAB — CBC
HCT: 40.3 % (ref 36.0–46.0)
Hemoglobin: 13.8 g/dL (ref 12.0–15.0)
MCH: 32.8 pg (ref 26.0–34.0)
MCHC: 34.2 g/dL (ref 30.0–36.0)
MCV: 95.7 fL (ref 80.0–100.0)
Platelets: 218 10*3/uL (ref 150–400)
RBC: 4.21 MIL/uL (ref 3.87–5.11)
RDW: 12.4 % (ref 11.5–15.5)
WBC: 10.7 10*3/uL — ABNORMAL HIGH (ref 4.0–10.5)
nRBC: 0 % (ref 0.0–0.2)

## 2020-07-11 LAB — URINALYSIS, ROUTINE W REFLEX MICROSCOPIC
Bilirubin Urine: NEGATIVE
Glucose, UA: NEGATIVE mg/dL
Hgb urine dipstick: NEGATIVE
Ketones, ur: NEGATIVE mg/dL
Nitrite: NEGATIVE
Protein, ur: NEGATIVE mg/dL
Specific Gravity, Urine: 1.008 (ref 1.005–1.030)
pH: 8 (ref 5.0–8.0)

## 2020-07-11 MED ORDER — CEPHALEXIN 500 MG PO CAPS: 500.0000 mg | ORAL_CAPSULE | Freq: Four times a day (QID) | ORAL | 0 refills | Status: DC

## 2020-07-11 NOTE — ED Provider Notes (Signed)
[facility]ER[facility]ENC[facility] DEPARTMENT Provider Note   CSN: [id number] Arrival date & time: 07/11/20  1242     History No chief complaint on file.   Kelsey Lopez is a 80 y.o. female.  HPI  Presents today complaining of tachycardia.  Patient has had some left low back pain.  She has had a urinary tract infection and felt that this was similar to previous urinary tract infection.  Symptoms of the low back pain have been going on for about 2 months.  She was seen today at her primary care office and was noted to be tachycardic and had an EKG that showed tachycardia consistent with SVT.  Patient did not appreciate any palpitations, chest pain, or dyspnea.  She was transported here via EMS reports that she had fluid in route and can tell when her heart rate went back to normal.  She is not having any complaints here in the ED.    She reports an evaluation by cardiology 11 years ago that included a stress test.  She was told everything was normal at that time and has not had any symptoms or work-up since. Past Medical History:  Diagnosis Date  . Anxiety   . Dyslipidemia    on Rx x20 years  . HTN (hypertension)   . Hyperlipidemia   . Insomnia   . Morton's neuroma of left foot   . Multiple allergies   . Osteoarthritis   . Polycythemia   . PVC (premature ventricular contraction)     Patient Active Problem List   Diagnosis Date Noted  . Osteopenia 04/15/2020  . Unilateral primary osteoarthritis, left knee 05/04/2019  . Unilateral primary osteoarthritis, right knee 05/04/2019  . CKD (chronic kidney disease) stage 2, GFR 60-89 ml/min 01/14/2017  . Depression, major, in remission ([place]) 11/27/2014  . Overweight (BMI 25.0-29.9) 04/10/2014  . Insomnia 04/10/2014  . Vitamin D deficiency 01/07/2014  . Abnormal glucose 01/03/2014  . Medication management 01/03/2014  . Anxiety   . Morton's neuroma of left foot   . Osteoarthritis   . Hyperlipidemia, mixed 06/05/2009  .  Essential hypertension 06/05/2009    Past Surgical History:  Procedure Laterality Date  . ABDOMINAL HYSTERECTOMY    . CATARACT EXTRACTION Right 2019   Dr. Ellie Lunch   . KNEE ARTHROSCOPY     R  . TONSILLECTOMY    . VESICOVAGINAL FISTULA CLOSURE W/ TAH       OB History   No obstetric history on file.     Family History  Problem Relation Age of Onset  . Osteoarthritis Mother   . Heart attack Mother   . Heart disease Mother   . Cancer Father        lung  . Osteoarthritis Father   . Hyperlipidemia Brother   . Diabetes Brother     Social History   Tobacco Use  . Smoking status: Never Smoker  . Smokeless tobacco: Never Used  . Tobacco comment: no tobacco   Substance Use Topics  . Alcohol use: Yes    Comment: rare    Home Medications Prior to Admission medications   Medication Sig Start Date End Date Taking? Authorizing Provider  ALPRAZolam (XANAX) 0.5 MG tablet TAKE 1/2 - 1 TABLET AT BEDTIME ONLY IF NEEDED FOR SLEEP & LIMIT TO 5 DAYS /WEEK TO AVOID ADDICTION & DEMENTIA 04/11/20   Liane Comber, NP  aspirin 81 MG EC tablet Take 81 mg by mouth daily.    [provider]  cetirizine (ZYRTEC) 10 MG tablet Take 10 mg by mouth as needed.    [provider]  Cholecalciferol (VITAMIN D3) 2000 UNITS capsule Take 2,000 Units by mouth daily.    [provider]  citalopram (CELEXA) 20 MG tablet TAKE 1 TABLET BY MOUTH DAILY FOR MOOD 04/16/20   Liane Comber, NP  conjugated estrogens (PREMARIN) vaginal cream Place 1 Applicatorful vaginally. Use once every week    [provider]  Flaxseed, Linseed, (FLAX SEED OIL) 1000 MG CAPS Take 1,000 mg by mouth daily.    [provider]  hydrochlorothiazide (HYDRODIURIL) 12.5 MG tablet TAKE 1 TABLET DAILY FOR BLOOD PRESSURE & FLUID RETENTION / ANKLE SWELLING 11/10/19   Unk Pinto, MD  losartan (COZAAR) 50 MG tablet TAKE 1 TABLET DAILY FOR BLOOD PRESSURE 02/06/20   Liane Comber, NP  Magnesium 400  MG CAPS Take 400 mg by mouth daily.    [provider]  meloxicam (MOBIC) 15 MG tablet Take  1/2 to 1 tablet  Daily  with Food  for Pain & Inflammation 05/26/20   Unk Pinto, MD  montelukast (SINGULAIR) 10 MG tablet TAKE 1 TABLET BY MOUTH DAILY FOR ALLERGIES 05/07/20   Unk Pinto, MD  Multiple Vitamin (MULTIVITAMIN) tablet Take 1 tablet by mouth daily.    [provider]  NON FORMULARY Calcium 1800 mg 1 tablet daily    [provider]  OVER THE COUNTER MEDICATION Otc Mucinex 1 tab daily    [provider]  predniSONE (DELTASONE) 20 MG tablet 2 tablets daily for 3 days, 1 tablet daily for 4 days. With food. 06/05/20   Liane Comber, NP  promethazine-dextromethorphan (PROMETHAZINE-DM) 6.25-15 MG/5ML syrup Take 5 mLs by mouth 4 (four) times daily as needed for cough. 06/05/20   Liane Comber, NP  simvastatin (ZOCOR) 40 MG tablet TAKE 1 TABLET BY MOUTH EVERYDAY AT BEDTIME 02/15/20   Liane Comber, NP  tiZANidine (ZANAFLEX) 4 MG tablet Take 1 tablet (4 mg total) by mouth every 8 (eight) hours as needed for muscle spasms. 05/13/20   Leandrew Koyanagi, MD  traZODone (DESYREL) 150 MG tablet TAKE 1/2 TO 1 TABLET 1 HOUR BEFORE BEDTIME IF NEEDED FOR SLEEP 01/24/20   Liane Comber, NP  vitamin C (ASCORBIC ACID) 500 MG tablet Take 500 mg by mouth daily.    [provider]    Allergies    Ace inhibitors, Ampicillin, Mevacor [lovastatin], Penicillins, and Sulfa antibiotics  Review of Systems   Review of Systems  HENT: Negative.   Respiratory: Negative.   Cardiovascular: Negative.   Gastrointestinal: Negative.   Genitourinary: Positive for frequency.  Musculoskeletal: Positive for back pain.  All other systems reviewed and are negative.   Physical Exam Updated Vital Signs BP 134/72   Pulse 83   Temp 98 F (36.7 C)   Resp 17   SpO2 97%   Physical Exam Vitals and nursing note reviewed.  Constitutional:      Appearance: She is well-developed.   HENT:     Head: Normocephalic and atraumatic.     Right Ear: External ear normal.     Left Ear: External ear normal.     Nose: Nose normal.  Eyes:     Conjunctiva/sclera: Conjunctivae normal.     Pupils: Pupils are equal, round, and reactive to light.  Neck:     Thyroid: No thyromegaly.     Vascular: No JVD.     Trachea: No tracheal deviation.  Cardiovascular:     Rate and  Rhythm: Normal rate and regular rhythm.     Heart sounds: Normal heart sounds.  Pulmonary:     Effort: Pulmonary effort is normal.     Breath sounds: Normal breath sounds. No wheezing.  Abdominal:     General: Bowel sounds are normal.     Palpations: Abdomen is soft. There is no mass.     Tenderness: There is no abdominal tenderness. There is no guarding.  Musculoskeletal:        General: Normal range of motion.     Cervical back: Normal range of motion and neck supple.  Lymphadenopathy:     Cervical: No cervical adenopathy.  Skin:    General: Skin is warm and dry.  Neurological:     Mental Status: She is alert and oriented to person, place, and time.     GCS: GCS eye subscore is 4. GCS verbal subscore is 5. GCS motor subscore is 6.     Cranial Nerves: No cranial nerve deficit.     Sensory: No sensory deficit.     Gait: Gait normal.     Deep Tendon Reflexes: Reflexes are normal and symmetric. Babinski sign absent on the right side. Babinski sign absent on the left side.     Reflex Scores:      Bicep reflexes are 2+ on the right side and 2+ on the left side.      Patellar reflexes are 2+ on the right side and 2+ on the left side.    Comments: Strength is normal and equal throughout. Cranial nerves grossly intact. Patient fluent. No gross ataxia and patient able to ambulate without difficulty.  Psychiatric:        Behavior: Behavior normal.        Thought Content: Thought content normal.        Judgment: Judgment normal.     ED Results / Procedures / Treatments   Labs (all labs ordered are  listed, but only abnormal results are displayed) Labs Reviewed - No data to display  EKG EKG Interpretation  Date/Time:  Friday July 11 2020 12:48:20 EDT Ventricular Rate:  82 PR Interval:  172 QRS Duration: 86 QT Interval:  386 QTC Calculation: 451 R Axis:   85 Text Interpretation: Sinus rhythm Borderline right axis deviation Confirmed by Pattricia Boss [phone]) on 07/11/2020 1:01:35 PM   Radiology DG Chest Port 1 View  Result Date: 07/11/2020 CLINICAL DATA:  Tachycardia EXAM: PORTABLE CHEST 1 VIEW COMPARISON:  06/06/2020 FINDINGS: The heart size and mediastinal contours are within normal limits. Both lungs are clear. The visualized skeletal structures are unremarkable. Unchanged elevation of right hemidiaphragm. IMPRESSION: No acute cardiopulmonary process Electronically Signed   By: Miachel Roux M.D.   On: 07/11/2020 14:04    Procedures Procedures   Medications Ordered in ED Medications - No data to display  ED Course  I have reviewed the triage vital signs and the nursing notes.  Pertinent labs & imaging results that were available during my care of the patient were reviewed by me and considered in my medical decision making (see chart for details).    MDM Rules/Calculators/A&P                         80 year old female presents today after being found to be in an SVT in her doctor's office.  She was there for some low back pain and thought she might have a UTI.  Here she is converted into a normal  sinus rhythm without intervention.  She reports that she felt like she converted and route after receiving some IV fluids.  Work-up here is significant for 1120 white blood cells in her urine.  This will be cultured and we will treat her with Keflex.  Manger of her labs are essentially within normal limits.  She is continued to be in a normal sinus rhythm.  She is advised regarding return precautions and need for follow-up and voices understanding.  Final Clinical Impression(s) / ED  Diagnoses Final diagnoses:  Urinary tract infection without hematuria, site unspecified  SVT (supraventricular tachycardia) ([place])    Rx / DC Orders ED Discharge Orders    None       Pattricia Boss, MD 07/11/20 [phone]

## 2020-07-11 NOTE — Addendum Note (Signed)
Addended by: Chancy Hurter on: 07/11/2020 12:57 PM   Modules accepted: Orders

## 2020-07-11 NOTE — ED Triage Notes (Signed)
Pt arrived to ED via EMS from doctors office. Pt went for a nurse visit to have labs drawn, pt heart noted to be 170. EKG revealed SVT. Pt converted to NSR en route after 500 ml fluid bolus given. Pt denies chest pain or sob No hx of any cardia events

## 2020-07-11 NOTE — Progress Notes (Signed)
Patient presents to the office for a nurse visit to leave a urine specimen. Having dysuria and back pain. Was seen by Urology about one month ago for the same symptoms but a UTI was not detected and instead was treated for a "vaginal infection". Since then, she's been having the same symptoms. Unable to be seen by provider due to no openings.  During the nurse visit, vitals were taken and pulse rate was in the 170's. Not having any other symptoms. EKG performed and provider notified and 911 called for transport to [facility].

## 2020-07-11 NOTE — Discharge Instructions (Addendum)
Your urine is consistent with possible urinary tract infection.  You are being treated with Keflex for your infection.  Your urine will be sent for culture. You appear to have a supraventricular tachycardia on your prehospital EKG.  However here in the emergency department you have been in a normal sinus rhythm.  If you have any new symptoms such as palpitations, lightheadedness, or chest pain please return to the emergency department.  Otherwise, follow-up with your primary care doctor next week.

## 2020-07-11 NOTE — ED Notes (Signed)
Dc  Instructions reviewed with pt.  Pt verbalized understanding. Pt Dc.

## 2020-07-12 ENCOUNTER — Encounter: Payer: Self-pay | Admitting: Adult Health

## 2020-07-12 DIAGNOSIS — I471 Supraventricular tachycardia, unspecified: Secondary | ICD-10-CM

## 2020-07-12 HISTORY — DX: Supraventricular tachycardia, unspecified: I47.10

## 2020-07-13 LAB — URINALYSIS, ROUTINE W REFLEX MICROSCOPIC
Bilirubin Urine: NEGATIVE
Glucose, UA: NEGATIVE
Hgb urine dipstick: NEGATIVE
Ketones, ur: NEGATIVE
Nitrite: NEGATIVE
RBC / HPF: NONE SEEN /HPF (ref 0–2)
Specific Gravity, Urine: 1.013 (ref 1.001–1.035)
pH: 8 (ref 5.0–8.0)

## 2020-07-13 LAB — URINE CULTURE
MICRO NUMBER:: 11967067
SPECIMEN QUALITY:: ADEQUATE

## 2020-07-13 LAB — MICROSCOPIC MESSAGE

## 2020-07-14 LAB — URINE CULTURE: Culture: >=100000 — AB

## 2020-07-15 ENCOUNTER — Telehealth: Payer: Self-pay | Admitting: Emergency Medicine

## 2020-07-15 NOTE — Telephone Encounter (Signed)
Post ED Visit - Positive Culture Follow-up  Culture report reviewed by antimicrobial stewardship pharmacist: [facility] Team []  Elenor Quinones, Pharm.D. []  Heide Guile, Pharm.D., BCPS AQ-ID []  Parks Neptune, Pharm.D., BCPS []  Alycia Rossetti, Pharm.D., BCPS []  [place], Pharm.D., BCPS, AAHIVP []  Legrand Como, Pharm.D., BCPS, AAHIVP []  Salome Arnt, PharmD, BCPS []  Johnnette Gourd, PharmD, BCPS []  Hughes Better, PharmD, BCPS []  Leeroy Cha, PharmD []  Laqueta Linden, PharmD, BCPS []  Albertina Parr, PharmD  [facility] Team []  Leodis Sias, PharmD []  Lindell Spar, PharmD []  Royetta Asal, PharmD []  Graylin Shiver, Rph []  Rema Fendt) Glennon Mac, PharmD []  Arlyn Dunning, PharmD []  Netta Cedars, PharmD []  Dia Sitter, PharmD []  Leone Haven, PharmD []  Gretta Arab, PharmD []  Theodis Shove, PharmD []  Peggyann Juba, PharmD []  Reuel Boom, PharmD   Positive urine culture Treated with cephalexin, organism sensitive to the same and no further patient follow-up is required at this time.  Hazle Nordmann 07/15/2020, 11:43 AM

## 2020-07-22 ENCOUNTER — Ambulatory Visit: Payer: Medicare Other | Admitting: Internal Medicine

## 2020-07-25 ENCOUNTER — Other Ambulatory Visit: Payer: Self-pay | Admitting: Internal Medicine

## 2020-07-25 DIAGNOSIS — N3000 Acute cystitis without hematuria: Secondary | ICD-10-CM

## 2020-07-25 MED ORDER — CIPROFLOXACIN HCL 500 MG PO TABS: ORAL_TABLET | ORAL | 0 refills | Status: DC

## 2020-07-31 ENCOUNTER — Other Ambulatory Visit: Payer: Self-pay | Admitting: Internal Medicine

## 2020-08-31 ENCOUNTER — Other Ambulatory Visit: Payer: Self-pay | Admitting: Adult Health

## 2020-09-16 ENCOUNTER — Other Ambulatory Visit: Payer: Self-pay | Admitting: Adult Health

## 2020-09-16 ENCOUNTER — Other Ambulatory Visit: Payer: Self-pay | Admitting: Internal Medicine

## 2020-09-16 DIAGNOSIS — F419 Anxiety disorder, unspecified: Secondary | ICD-10-CM

## 2020-09-16 DIAGNOSIS — I10 Essential (primary) hypertension: Secondary | ICD-10-CM

## 2020-09-30 ENCOUNTER — Ambulatory Visit: Payer: Medicare Other | Admitting: Orthopaedic Surgery

## 2020-09-30 ENCOUNTER — Ambulatory Visit: Payer: Self-pay

## 2020-09-30 ENCOUNTER — Encounter: Payer: Self-pay | Admitting: Orthopaedic Surgery

## 2020-09-30 ENCOUNTER — Other Ambulatory Visit: Payer: Self-pay

## 2020-09-30 DIAGNOSIS — M1711 Unilateral primary osteoarthritis, right knee: Secondary | ICD-10-CM | POA: Diagnosis not present

## 2020-09-30 DIAGNOSIS — M1712 Unilateral primary osteoarthritis, left knee: Secondary | ICD-10-CM | POA: Diagnosis not present

## 2020-09-30 MED ORDER — LIDOCAINE HCL 1 % IJ SOLN
2.0000 mL | INTRAMUSCULAR | Status: AC | PRN
  Administered 2020-09-30: 2 mL

## 2020-09-30 MED ORDER — BUPIVACAINE HCL 0.5 % IJ SOLN
2.0000 mL | INTRAMUSCULAR | Status: AC | PRN
  Administered 2020-09-30: 2 mL via INTRA_ARTICULAR

## 2020-09-30 MED ORDER — METHYLPREDNISOLONE ACETATE 40 MG/ML IJ SUSP
40.0000 mg | INTRAMUSCULAR | Status: AC | PRN
Start: 2020-09-30 — End: 2020-09-30
  Administered 2020-09-30: 40 mg via INTRA_ARTICULAR

## 2020-09-30 MED ORDER — METHYLPREDNISOLONE ACETATE 40 MG/ML IJ SUSP
40.0000 mg | INTRAMUSCULAR | Status: AC | PRN
  Administered 2020-09-30: 40 mg via INTRA_ARTICULAR

## 2020-09-30 NOTE — Progress Notes (Signed)
Office Visit Note   Patient: Kelsey Lopez           Date of Birth: [birth year]           MRN: [id number] Visit Date: 09/30/2020              Requested by: Unk Pinto, [address] [place],  [place] [address] PCP: Unk Pinto, MD   Assessment & Plan: Visit Diagnoses:  1. Primary osteoarthritis of right knee   2. Primary osteoarthritis of left knee     Plan: Bilateral knee injections performed today.  Patient tolerated well.  She continues to be fairly functional and the pain is well tolerated and manageable.  She will continue take Tylenol as needed for the pain.  We will see her back as needed.  Follow-Up Instructions: Return if symptoms worsen or fail to improve.   Orders:  Orders Placed This Encounter  Procedures   Large Joint Inj: bilateral knee   XR KNEE 3 VIEW LEFT   XR KNEE 3 VIEW RIGHT   No orders of the defined types were placed in this encounter.     Procedures: Large Joint Inj: bilateral knee on 09/30/2020 2:46 PM Indications: pain Details: 22 G needle  Arthrogram: No  Medications (Right): 2 mL lidocaine 1 %; 2 mL bupivacaine 0.5 %; 40 mg methylPREDNISolone acetate 40 MG/ML Medications (Left): 2 mL lidocaine 1 %; 2 mL bupivacaine 0.5 %; 40 mg methylPREDNISolone acetate 40 MG/ML Outcome: tolerated well, no immediate complications Patient was prepped and draped in the usual sterile fashion.      Clinical Data: No additional findings.   Subjective: Chief Complaint  Patient presents with   Right Knee - Pain   Left Knee - Pain    HPI Kelsey Lopez returns today for bilateral knee pain due to DJD.  She is requesting cortisone injections today.  Prior injections were over a year ago with good relief.  Review of Systems   Objective: Vital Signs: There were no vitals taken for this visit.  Physical Exam  Ortho Exam Right knee shows a valgus deformity with patellofemoral crepitus with range of motion. Left knee exam is  unchanged. Specialty Comments:  No specialty comments available.  Imaging: XR KNEE 3 VIEW LEFT  Result Date: 09/30/2020 Advanced tricompartmental degenerative joint disease worst in the patellofemoral compartment.  XR KNEE 3 VIEW RIGHT  Result Date: 09/30/2020 Advanced tricompartmental degenerative joint disease with valgus deformity.    PMFS History: Patient Active Problem List   Diagnosis Date Noted   SVT (supraventricular tachycardia) ([place]) 07/12/2020   Osteopenia 04/15/2020   Unilateral primary osteoarthritis, left knee 05/04/2019   Unilateral primary osteoarthritis, right knee 05/04/2019   CKD (chronic kidney disease) stage 2, GFR 60-89 ml/min 01/14/2017   Depression, major, in remission ([place]) 11/27/2014   Overweight (BMI 25.0-29.9) 04/10/2014   Insomnia 04/10/2014   Vitamin D deficiency 01/07/2014   Abnormal glucose 01/03/2014   Medication management 01/03/2014   Anxiety    Morton's neuroma of left foot    Osteoarthritis    Hyperlipidemia, mixed 06/05/2009   Essential hypertension 06/05/2009   Past Medical History:  Diagnosis Date   Anxiety    Dyslipidemia    on Rx x20 years   HTN (hypertension)    Hyperlipidemia    Insomnia    Morton's neuroma of left foot    Multiple allergies    Osteoarthritis    Polycythemia    PVC (premature ventricular contraction)  Family History  Problem Relation Age of Onset   Osteoarthritis Mother    Heart attack Mother    Heart disease Mother    Cancer Father        lung   Osteoarthritis Father    Hyperlipidemia Brother    Diabetes Brother     Past Surgical History:  Procedure Laterality Date   ABDOMINAL HYSTERECTOMY     CATARACT EXTRACTION Right 2019   Dr. Ellie Lunch    KNEE ARTHROSCOPY     R   TONSILLECTOMY     VESICOVAGINAL FISTULA CLOSURE W/ TAH     Social History   Occupational History   Not on file  Tobacco Use   Smoking status: Never   Smokeless tobacco: Never   Tobacco comments:    no tobacco    Substance and Sexual Activity   Alcohol use: Yes    Comment: rare   Drug use: Not on file   Sexual activity: Not on file

## 2020-11-27 ENCOUNTER — Other Ambulatory Visit: Payer: Self-pay | Admitting: Adult Health

## 2020-11-27 DIAGNOSIS — I10 Essential (primary) hypertension: Secondary | ICD-10-CM

## 2021-01-05 ENCOUNTER — Other Ambulatory Visit: Payer: Self-pay | Admitting: Nurse Practitioner

## 2021-01-11 ENCOUNTER — Encounter: Payer: Self-pay | Admitting: Internal Medicine

## 2021-01-11 NOTE — Patient Instructions (Signed)

## 2021-01-11 NOTE — Progress Notes (Signed)
Annual Screening/Preventative Visit & Comprehensive Evaluation &  Examination  Future Appointments  Date Time Provider  01/12/2021         CPE 10:00 AM Unk Pinto, MD  04/16/2021           Wellness  2:30 PM Liane Comber, NP  01/12/2022          CPE 10:00 AM Unk Pinto, MD        This very nice 80 y.o.WWF  presents for a Screening /Preventative Visit & comprehensive evaluation and management of multiple medical co-morbidities.  Patient has been followed for HTN, HLD, Prediabetes  and Vitamin D Deficiency. Patient has hx/o Depression in remission on meds.         HTN predates circa 1996.  Patient  has CKD2  20   her HTN. Patient's BP has been controlled at home and patient denies any cardiac symptoms as chest pain, palpitations, shortness of breath, dizziness or ankle swelling. Today's BP is at goal -  120/68 .       Patient's hyperlipidemia is controlled with diet and medications. Patient denies myalgias or other medication SE's. Last lipids were at goal :  Lab Results  Component Value Date   CHOL 164 04/16/2020   HDL 67 04/16/2020   LDLCALC 80 04/16/2020   TRIG 90 04/16/2020   CHOLHDL 2.4 04/16/2020         Patient has hx/o prediabetes (A1c 5.8% /2015) and patient denies reactive hypoglycemic symptoms, visual blurring, diabetic polys or paresthesias. Last A1c was normal & at goal :  Lab Results  Component Value Date   HGBA1C 5.4 12/26/2019        Finally, patient has history of Vitamin D Deficiency and last Vitamin D was near goal :  Lab Results  Component Value Date   VD25OH 56 12/26/2019     Current Outpatient Medications on File Prior to Visit  Medication Sig   ALPRAZolam  0.5 MG tablet TAKE 1/2 -1 TABAT BEDTIME ONLY IF NEEDED    aspirin 81 MG EC tablet Take daily.   cetirizine  10 MG tablet Take as needed.   VITAMIN D  2000 UNITS  Take 2,000 Units daily.   citalopram 20 MG tablet TAKE 1 TABLET  DAILY FOR MOOD   PREMARIN vaginal cream Place  vaginally  once every week   FLAX SEED OIL 1000 MG  Take  daily.   hydrochlorothiazide  12.5 MG  TAKE 1 TABLET DAILY    losartan 50 MG tablet TAKE 1 TABLET  EVERY DAY    Magnesium 400 MG CAPS Take  daily.   meloxicam  15 MG tablet Take  1/2 to 1 tablet  Daily     montelukast  10 MG tablet Take  1 tablet  Daily    Multiple Vitamin  tablet Take 1 tablet  daily.   Calcium 1800 mg  1 tablet daily   Otc Mucinex  1 tab daily   simvastatin 40 MG tablet Take  1 tablet  at Bedtime     tiZANidine 4 MG tablet Take 1 tablet  every 8 hours as needed    traZODone 150 MG tablet TAKE 1/2-1 TAB 1 HOUR BEFORE BEDTIME IF NEEDED    vitamin C 500 MG tablet Take daily.      Allergies  Allergen Reactions   Ace Inhibitors     Cough   Ampicillin     rash   Mevacor [Lovastatin]     Elevated LFT's  Penicillins     REACTION: hives and  itching   Sulfa Antibiotics     rash     Past Medical History:  Diagnosis Date   Anxiety    Dyslipidemia    on Rx x20 years   HTN (hypertension)    Hyperlipidemia    Insomnia    Morton's neuroma of left foot    Multiple allergies    Osteoarthritis    Polycythemia    PVC (premature ventricular contraction)      Health Maintenance  Topic Date Due   MAMMOGRAM  12/26/2020   TETANUS/TDAP  08/02/2023   Pneumonia Vaccine 65+ Years old  Completed   INFLUENZA VACCINE  Completed   DEXA SCAN  Completed   COVID-19 Vaccine  Completed   Zoster Vaccines- Shingrix  Completed   HPV VACCINES  Aged Out     Immunization History  Administered Date(s) Administered   Influenza Split 11/15/2011, 11/28/2012, 10/31/2013, 01/07/2014   Influenza, High Dose Seasonal PF 10/30/2017, 10/01/2018, 10/23/2019, 10/14/2020   Influenza 09/29/2016, 10/01/2018   PFIZER Covid-19 Tri-Sucrose Vacc 05/12/2020   PFIZER SARS-COV-2 Vacci 03/02/2019, 03/23/2019, 11/20/2019   Pfizer Covid-19 VaccBivalent Booster  12/01/2020   Pneumococcal -13 10/02/2013   Pneumococcal -23 08/03/2017    Tdap 08/01/2013   Zoster Recombinat (Shingrix) 11/10/2016, 01/10/2017   Zoster, Live 05/25/2011     Last Colon - 05/15/2012 - Dr Watt Climes - Recc no f/u due to Age   Last MGM - 12/27/2019  [place] BMD - Osteopenia T score -2.3    Past Surgical History:  Procedure Laterality Date   ABDOMINAL HYSTERECTOMY     CATARACT EXTRACTION Right 2019   Dr. Ellie Lunch    KNEE ARTHROSCOPY     R   TONSILLECTOMY     VESICOVAGINAL FISTULA CLOSURE W/ TAH       Family History  Problem Relation Age of Onset   Osteoarthritis Mother    Heart attack Mother    Heart disease Mother    Cancer Father        lung   Osteoarthritis Father    Hyperlipidemia Brother    Diabetes Brother      Social History   Tobacco Use   Smoking status: Never   Smokeless tobacco: Never   Tobacco comments:    no tobacco   Substance Use Topics   Alcohol use: Yes    Comment: rare      ROS Constitutional: Denies fever, chills, weight loss/gain, headaches, insomnia,  night sweats, and change in appetite. Does c/o fatigue. Eyes: Denies redness, blurred vision, diplopia, discharge, itchy, watery eyes.  ENT: Denies discharge, congestion, post nasal drip, epistaxis, sore throat, earache, hearing loss, dental pain, Tinnitus, Vertigo, Sinus pain, snoring.  Cardio: Denies chest pain, palpitations, irregular heartbeat, syncope, dyspnea, diaphoresis, orthopnea, PND, claudication, edema Respiratory: denies cough, dyspnea, DOE, pleurisy, hoarseness, laryngitis, wheezing.  Gastrointestinal: Denies dysphagia, heartburn, reflux, water brash, pain, cramps, nausea, vomiting, bloating, diarrhea, constipation, hematemesis, melena, hematochezia, jaundice, hemorrhoids Genitourinary: Denies dysuria, frequency, urgency, nocturia, hesitancy, discharge, hematuria, flank pain Breast: Breast lumps, nipple discharge, bleeding.  Musculoskeletal: Denies arthralgia, myalgia, stiffness, Jt. Swelling, pain, limp, and strain/sprain. Denies  falls. Skin: Denies puritis, rash, hives, warts, acne, eczema, changing in skin lesion Neuro: No weakness, tremor, incoordination, spasms, paresthesia, pain Psychiatric: Denies confusion, memory loss, sensory loss. Denies Depression. Endocrine: Denies change in weight, skin, hair change, nocturia, and paresthesia, diabetic polys, visual blurring, hyper / hypo glycemic episodes.  Heme/Lymph: No excessive bleeding, bruising, enlarged lymph  nodes.  Physical Exam  BP 120/68   Pulse 68   Temp 97.9 F (36.6 C)   Resp 16   Ht 5\' 5"  (1.651 m)   Wt 163 lb 8 oz (74.2 kg)   SpO2 96%   BMI 27.21 kg/m   General Appearance: Well nourished, well groomed and in no apparent distress.  Eyes: PERRLA, EOMs, conjunctiva no swelling or erythema, normal fundi and vessels. Sinuses: No frontal/maxillary tenderness ENT/Mouth: EACs patent / TMs  nl. Nares clear without erythema, swelling, mucoid exudates. Oral hygiene is good. No erythema, swelling, or exudate. Tongue normal, non-obstructing. Tonsils not swollen or erythematous. Hearing normal.  Neck: Supple, thyroid not palpable. No bruits, nodes or JVD. Respiratory: Respiratory effort normal.  BS equal and clear bilateral without rales, rhonci, wheezing or stridor. Cardio: Heart sounds are normal with regular rate and rhythm and no murmurs, rubs or gallops. Peripheral pulses are normal and equal bilaterally without edema. No aortic or femoral bruits. Chest: symmetric with normal excursions and percussion. Breasts: Symmetric, without lumps, nipple discharge, retractions, or fibrocystic changes.  Abdomen: Flat, soft with bowel sounds active. Nontender, no guarding, rebound, hernias, masses, or organomegaly.  Lymphatics: Non tender without lymphadenopathy.  Musculoskeletal: Full ROM all peripheral extremities, joint stability, 5/5 strength, and normal gait. Skin: Warm and dry without rashes, lesions, cyanosis, clubbing or  ecchymosis.  Neuro: Cranial nerves  intact, reflexes equal bilaterally. Normal muscle tone, no cerebellar symptoms. Sensation intact.  Pysch: Alert and oriented X 3, normal affect, Insight and Judgment appropriate.    Assessment and Plan  1. Annual Preventative Screening Examination   2. Essential hypertension  - EKG 12-Lead - Urinalysis, Routine w reflex microscopic - Microalbumin / creatinine urine ratio - CBC with Differential/Platelet - COMPLETE METABOLIC PANEL WITH GFR - Magnesium - TSH  3. Hyperlipidemia, mixed  - EKG 12-Lead - Lipid panel - TSH  4. Abnormal glucose  - EKG 12-Lead - Hemoglobin A1c - Insulin, random  5. Vitamin D deficiency  - VITAMIN D 25 Hydroxy   6. Depression, major, in remission ([place])  - TSH  7. Osteopenia of multiple sites  - COMPLETE METABOLIC PANEL WITH GFR - TSH - VITAMIN D 25 Hydroxy   8. Stage 2 chronic kidney disease  - Urinalysis, Routine w reflex microscopic - Microalbumin / creatinine urine ratio  9. Screening for heart disease  - EKG 12-Lead  10. FHx: heart disease  - EKG 12-Lead  11. Medication management  - Urinalysis, Routine w reflex microscopic - Microalbumin / creatinine urine ratio - CBC with Differential/Platelet - COMPLETE METABOLIC PANEL WITH GFR - Magnesium - Lipid panel - TSH - Hemoglobin A1c - Insulin, random - VITAMIN D 25 Hydroxy   12. Screening for colorectal cancer  - POC Hemoccult Bld/Stl            Patient was counseled in prudent diet to achieve/maintain BMI less than 25 for weight control, BP monitoring, regular exercise and medications. Discussed med's effects and SE's. Screening labs and tests as requested with regular follow-up as recommended. Over 40 minutes of exam, counseling, chart review and high complex critical decision making was performed.   Kirtland Bouchard, MD

## 2021-01-12 ENCOUNTER — Other Ambulatory Visit: Payer: Self-pay

## 2021-01-12 ENCOUNTER — Ambulatory Visit (INDEPENDENT_AMBULATORY_CARE_PROVIDER_SITE_OTHER): Payer: Medicare Other | Admitting: Internal Medicine

## 2021-01-12 ENCOUNTER — Encounter: Payer: Self-pay | Admitting: Internal Medicine

## 2021-01-12 VITALS — BP 120/68 | HR 68 | Temp 97.9°F | Resp 16 | Ht 65.0 in | Wt 163.5 lb

## 2021-01-12 DIAGNOSIS — M8589 Other specified disorders of bone density and structure, multiple sites: Secondary | ICD-10-CM

## 2021-01-12 DIAGNOSIS — Z136 Encounter for screening for cardiovascular disorders: Secondary | ICD-10-CM | POA: Diagnosis not present

## 2021-01-12 DIAGNOSIS — E559 Vitamin D deficiency, unspecified: Secondary | ICD-10-CM

## 2021-01-12 DIAGNOSIS — F325 Major depressive disorder, single episode, in full remission: Secondary | ICD-10-CM

## 2021-01-12 DIAGNOSIS — Z8249 Family history of ischemic heart disease and other diseases of the circulatory system: Secondary | ICD-10-CM

## 2021-01-12 DIAGNOSIS — Z79899 Other long term (current) drug therapy: Secondary | ICD-10-CM

## 2021-01-12 DIAGNOSIS — I10 Essential (primary) hypertension: Secondary | ICD-10-CM

## 2021-01-12 DIAGNOSIS — N182 Chronic kidney disease, stage 2 (mild): Secondary | ICD-10-CM

## 2021-01-12 DIAGNOSIS — Z0000 Encounter for general adult medical examination without abnormal findings: Secondary | ICD-10-CM | POA: Diagnosis not present

## 2021-01-12 DIAGNOSIS — Z0001 Encounter for general adult medical examination with abnormal findings: Secondary | ICD-10-CM

## 2021-01-12 DIAGNOSIS — R7309 Other abnormal glucose: Secondary | ICD-10-CM

## 2021-01-12 DIAGNOSIS — Z1211 Encounter for screening for malignant neoplasm of colon: Secondary | ICD-10-CM

## 2021-01-12 DIAGNOSIS — E782 Mixed hyperlipidemia: Secondary | ICD-10-CM

## 2021-01-13 ENCOUNTER — Other Ambulatory Visit: Payer: Self-pay | Admitting: Internal Medicine

## 2021-01-13 DIAGNOSIS — N390 Urinary tract infection, site not specified: Secondary | ICD-10-CM

## 2021-01-13 LAB — CBC WITH DIFFERENTIAL/PLATELET
Absolute Monocytes: 519 cells/uL (ref 200–950)
Basophils Absolute: 44 cells/uL (ref 0–200)
Basophils Relative: 0.5 %
Eosinophils Absolute: 229 cells/uL (ref 15–500)
Eosinophils Relative: 2.6 %
HCT: 40.2 % (ref 35.0–45.0)
Hemoglobin: 13.4 g/dL (ref 11.7–15.5)
Lymphs Abs: 1047 cells/uL (ref 850–3900)
MCH: 32.9 pg (ref 27.0–33.0)
MCHC: 33.3 g/dL (ref 32.0–36.0)
MCV: 98.8 fL (ref 80.0–100.0)
MPV: 10 fL (ref 7.5–12.5)
Monocytes Relative: 5.9 %
Neutro Abs: 6961 cells/uL (ref 1500–7800)
Neutrophils Relative %: 79.1 %
Platelets: 227 10*3/uL (ref 140–400)
RBC: 4.07 10*6/uL (ref 3.80–5.10)
RDW: 11.7 % (ref 11.0–15.0)
Total Lymphocyte: 11.9 %
WBC: 8.8 10*3/uL (ref 3.8–10.8)

## 2021-01-13 LAB — URINALYSIS, ROUTINE W REFLEX MICROSCOPIC
Bilirubin Urine: NEGATIVE
Glucose, UA: NEGATIVE
Hgb urine dipstick: NEGATIVE
Leukocytes,Ua: NEGATIVE
Nitrite: NEGATIVE
Specific Gravity, Urine: 1.023 (ref 1.001–1.035)
pH: 6 (ref 5.0–8.0)

## 2021-01-13 LAB — COMPLETE METABOLIC PANEL WITH GFR
AG Ratio: 1.9 (calc) (ref 1.0–2.5)
ALT: 12 U/L (ref 6–29)
AST: 17 U/L (ref 10–35)
Albumin: 4 g/dL (ref 3.6–5.1)
Alkaline phosphatase (APISO): 60 U/L (ref 37–153)
BUN: 12 mg/dL (ref 7–25)
CO2: 28 mmol/L (ref 20–32)
Calcium: 9.9 mg/dL (ref 8.6–10.4)
Chloride: 99 mmol/L (ref 98–110)
Creat: 0.81 mg/dL (ref 0.60–0.95)
Globulin: 2.1 g/dL (calc) (ref 1.9–3.7)
Glucose, Bld: 104 mg/dL — ABNORMAL HIGH (ref 65–99)
Potassium: 3.8 mmol/L (ref 3.5–5.3)
Sodium: 138 mmol/L (ref 135–146)
Total Bilirubin: 0.6 mg/dL (ref 0.2–1.2)
Total Protein: 6.1 g/dL (ref 6.1–8.1)
eGFR: 73 mL/min/{1.73_m2} (ref >=60)

## 2021-01-13 LAB — LIPID PANEL
Cholesterol: 152 mg/dL (ref <200)
HDL: 68 mg/dL (ref >=50)
LDL Cholesterol (Calc): 67 mg/dL (calc)
Non-HDL Cholesterol (Calc): 84 mg/dL (calc) (ref <130)
Total CHOL/HDL Ratio: 2.2 (calc) (ref <5.0)
Triglycerides: 83 mg/dL (ref <150)

## 2021-01-13 LAB — MICROALBUMIN / CREATININE URINE RATIO
Creatinine, Urine: 207 mg/dL (ref 20–275)
Microalb Creat Ratio: 8 mcg/mg creat (ref <30)
Microalb, Ur: 1.7 mg/dL

## 2021-01-13 LAB — HEMOGLOBIN A1C
Hgb A1c MFr Bld: 5.3 % of total Hgb (ref <5.7)
Mean Plasma Glucose: 105 mg/dL
eAG (mmol/L): 5.8 mmol/L

## 2021-01-13 LAB — TSH: TSH: 2.07 mIU/L (ref 0.40–4.50)

## 2021-01-13 LAB — INSULIN, RANDOM: Insulin: 14.2 u[IU]/mL

## 2021-01-13 LAB — VITAMIN D 25 HYDROXY (VIT D DEFICIENCY, FRACTURES): Vit D, 25-Hydroxy: 70 ng/mL (ref 30–100)

## 2021-01-13 LAB — MAGNESIUM: Magnesium: 1.8 mg/dL (ref 1.5–2.5)

## 2021-01-13 LAB — MICROSCOPIC MESSAGE

## 2021-01-13 NOTE — Progress Notes (Signed)
============================================================ -   Test results slightly outside the reference range are not unusual. If there is anything important, I will review this with you,  otherwise it is considered normal test values.  If you have further questions,  please do not hesitate to contact me at the office or via My Chart.  ============================================================ ============================================================  -  U/A - very suspicious for UTI - So requested lab do culture.  ============================================================ ============================================================  - Total Chol = 152  -  Excellent   - Very low risk for Heart Attack  / Stroke ============================================================ ============================================================  -   Magnesium  -   1.8  -  very  low- goal is betw 2.0 - 2.5,   - So..............Marland Kitchen  Recommend that you take  Magnesium 500 mg tablet daily   - also important to eat lots of  leafy green vegetables   - spinach - Kale - collards - greens - okra - asparagus  - broccoli - quinoa - squash - almonds   - black, red, white beans  -  peas - green beans ============================================================ ============================================================  - A1c - Normal - No Diabetes  - Great  ! ============================================================ ============================================================  - Vitamin D = 70  - Excellent   ! ============================================================ ============================================================  - All Else - CBC - Kidneys - Electrolytes - Liver - Magnesium & Thyroid    - all  Normal / OK ============================================================ ============================================================  -  Keep up the [place] Work   !  ============================================================ ============================================================

## 2021-01-16 ENCOUNTER — Ambulatory Visit (INDEPENDENT_AMBULATORY_CARE_PROVIDER_SITE_OTHER): Payer: Medicare Other

## 2021-01-16 ENCOUNTER — Encounter: Payer: Self-pay | Admitting: Orthopaedic Surgery

## 2021-01-16 ENCOUNTER — Other Ambulatory Visit: Payer: Self-pay

## 2021-01-16 ENCOUNTER — Other Ambulatory Visit: Payer: Medicare Other

## 2021-01-16 ENCOUNTER — Ambulatory Visit: Payer: Medicare Other | Admitting: Orthopaedic Surgery

## 2021-01-16 DIAGNOSIS — M545 Low back pain, unspecified: Secondary | ICD-10-CM

## 2021-01-16 DIAGNOSIS — G8929 Other chronic pain: Secondary | ICD-10-CM | POA: Diagnosis not present

## 2021-01-16 DIAGNOSIS — N390 Urinary tract infection, site not specified: Secondary | ICD-10-CM

## 2021-01-16 MED ORDER — TRAMADOL HCL 50 MG PO TABS: 50.0000 mg | ORAL_TABLET | Freq: Every day | ORAL | 0 refills | Status: DC | PRN

## 2021-01-16 NOTE — Progress Notes (Signed)
Office Visit Note   Patient: Kelsey Lopez           Date of Birth: 1940/03/07           MRN: [id number] Visit Date: 01/16/2021              Requested by: Unk Pinto, [address] [place],  [place] [address] PCP: Unk Pinto, MD   Assessment & Plan: Visit Diagnoses:  1. Chronic left-sided low back pain without sciatica     Plan: Kelsey Lopez comes in today for chronic low back pain.  She states that the pain is mild in the morning and gets worse throughout the day.  She is still very active.  She continues to wear the lumbar corset during activity.  Taking over-the-counter medications currently which only helped temporarily.  Denies any red flag symptoms.  Lumbar spine exam is unchanged.  Kelsey Lopez does have quite a bit of diffuse degenerative changes with degenerative scoliosis.  Fortunately she is not reporting any red flag symptoms.  I do feel that she has been suffering from this for quite some time and at this point we are going to order an MRI of the lumbar spine to see if she might benefit from injections.  I have sent in prescription for tramadol to help with the more intense pain.  I will touch base with her regarding the MRI and next steps.  Follow-Up Instructions: No follow-ups on file.   Orders:  Orders Placed This Encounter  Procedures   XR Lumbar Spine 2-3 Views   Ambulatory referral to Physical Therapy   Meds ordered this encounter  Medications   traMADol (ULTRAM) 50 MG tablet    Sig: Take 1-2 tablets (50-100 mg total) by mouth daily as needed.    Dispense:  30 tablet    Refill:  0      Procedures: No procedures performed   Clinical Data: No additional findings.   Subjective: Chief Complaint  Patient presents with   Lower Back - Pain    HPI  Review of Systems   Objective: Vital Signs: There were no vitals taken for this visit.  Physical Exam  Ortho Exam  Specialty Comments:  No specialty comments  available.  Imaging: No results found.   PMFS History: Patient Active Problem List   Diagnosis Date Noted   SVT (supraventricular tachycardia) ([place]) 07/12/2020   Osteopenia 04/15/2020   Unilateral primary osteoarthritis, left knee 05/04/2019   Unilateral primary osteoarthritis, right knee 05/04/2019   CKD (chronic kidney disease) stage 2, GFR 60-89 ml/min 01/14/2017   Depression, major, in remission ([place]) 11/27/2014   Overweight (BMI 25.0-29.9) 04/10/2014   Insomnia 04/10/2014   Vitamin D deficiency 01/07/2014   Abnormal glucose 01/03/2014   Medication management 01/03/2014   Anxiety    Morton's neuroma of left foot    Osteoarthritis    Hyperlipidemia, mixed 06/05/2009   Essential hypertension 06/05/2009   Past Medical History:  Diagnosis Date   Anxiety    Dyslipidemia    on Rx x20 years   HTN (hypertension)    Hyperlipidemia    Insomnia    Morton's neuroma of left foot    Multiple allergies    Osteoarthritis    Polycythemia    PVC (premature ventricular contraction)     Family History  Problem Relation Age of Onset   Osteoarthritis Mother    Heart attack Mother    Heart disease Mother    Cancer Father  lung   Osteoarthritis Father    Hyperlipidemia Brother    Diabetes Brother     Past Surgical History:  Procedure Laterality Date   ABDOMINAL HYSTERECTOMY     CATARACT EXTRACTION Right 2019   Dr. Ellie Lunch    KNEE ARTHROSCOPY     R   TONSILLECTOMY     VESICOVAGINAL FISTULA CLOSURE W/ TAH     Social History   Occupational History   Not on file  Tobacco Use   Smoking status: Never   Smokeless tobacco: Never   Tobacco comments:    no tobacco   Substance and Sexual Activity   Alcohol use: Yes    Comment: rare   Drug use: Not on file   Sexual activity: Not on file

## 2021-01-17 LAB — URINE CULTURE
MICRO NUMBER:: 12738936
SPECIMEN QUALITY:: ADEQUATE

## 2021-01-26 ENCOUNTER — Ambulatory Visit
Admission: RE | Admit: 2021-01-26 | Discharge: 2021-01-26 | Disposition: A | Discharge status: Home / Self Care | Payer: Medicare Other | Source: Ambulatory Visit | Attending: Orthopaedic Surgery | Admitting: Orthopaedic Surgery

## 2021-01-26 DIAGNOSIS — M545 Low back pain, unspecified: Secondary | ICD-10-CM

## 2021-01-27 ENCOUNTER — Ambulatory Visit: Payer: Medicare Other | Attending: Orthopaedic Surgery | Admitting: Physical Therapy

## 2021-01-27 ENCOUNTER — Other Ambulatory Visit: Payer: Self-pay

## 2021-01-27 DIAGNOSIS — M545 Low back pain, unspecified: Secondary | ICD-10-CM | POA: Insufficient documentation

## 2021-01-27 DIAGNOSIS — M546 Pain in thoracic spine: Secondary | ICD-10-CM | POA: Diagnosis present

## 2021-01-27 DIAGNOSIS — G8929 Other chronic pain: Secondary | ICD-10-CM | POA: Diagnosis present

## 2021-01-27 DIAGNOSIS — R293 Abnormal posture: Secondary | ICD-10-CM | POA: Diagnosis present

## 2021-01-27 DIAGNOSIS — M6281 Muscle weakness (generalized): Secondary | ICD-10-CM | POA: Diagnosis not present

## 2021-01-27 DIAGNOSIS — R2689 Other abnormalities of gait and mobility: Secondary | ICD-10-CM | POA: Diagnosis present

## 2021-01-27 NOTE — Therapy (Signed)
[address] [place], [place], [address] Phone: [phone]   Fax:  [phone]  Physical Therapy Evaluation  Patient Details  Name: FRANCOISE CHOJNOWSKI MRN: [id number] Date of Birth: [birth year] Referring Provider (PT): Leandrew Koyanagi, MD   Encounter Date: 01/27/2021   PT End of Session - 01/27/21 1301     Visit Number 1    Number of Visits 12    Date for PT Re-Evaluation 03/10/21    Authorization Type [employer] Medicare    Progress Note Due on Visit 10    PT Start Time 1305    PT Stop Time 1350    PT Time Calculation (min) 45 min    Activity Tolerance Patient tolerated treatment well    Behavior During Therapy [facility] for tasks assessed/performed             Past Medical History:  Diagnosis Date   Anxiety    Dyslipidemia    on Rx x20 years   HTN (hypertension)    Hyperlipidemia    Insomnia    Morton's neuroma of left foot    Multiple allergies    Osteoarthritis    Polycythemia    PVC (premature ventricular contraction)     Past Surgical History:  Procedure Laterality Date   ABDOMINAL HYSTERECTOMY     CATARACT EXTRACTION Right 2019   Dr. Ellie Lunch    KNEE ARTHROSCOPY     R   TONSILLECTOMY     VESICOVAGINAL FISTULA CLOSURE W/ TAH      There were no vitals filed for this visit.    Subjective Assessment - 01/27/21 1308     Subjective Pt reports she got MRI done yesterday to get cortisone shots. 2-3 years ago she was diagnosed with degenerative scoliosis. Didn't have much problem until this year. Pt has been using a back brace and taking arthritis strength tylenol and muscle relaxer along with heating pad. Pt states it's getting more and more difficult to cope with her usual methods. Pt reports decrease in her activity level. Feels it worse without back brace    Pertinent History knee issues    Limitations Standing;House hold activities    How long can you sit comfortably? n/a    How long can you stand  comfortably? Increased with standing in kitchen etc.    How long can you walk comfortably? is able to do her groceries without it bothering her too much    Patient Stated Goals Improve pain with activity    Currently in Pain? Yes    Pain Score 3     Pain Location Back    Pain Orientation Mid;Left    Pain Descriptors / Indicators Constant;Aching    Pain Type Chronic pain    Pain Radiating Towards Some mornings she can feel it down both legs    Pain Onset More than a month ago    Pain Frequency Constant    Aggravating Factors  Increased time standing    Pain Relieving Factors Heat, back brace                [facility] PT Assessment - 01/27/21 0001       Assessment   Medical Diagnosis Degenerative scoliosis, low back pain    Referring Provider (PT) Leandrew Koyanagi, MD    Prior Therapy For knees      Precautions   Precautions None      Restrictions   Weight Bearing Restrictions No  Balance Screen   Has the patient fallen in the past 6 months No      [place] residence    Living Arrangements Alone    Available Help at Discharge Family    Type of [place] Access Level entry      Observation/Other Assessments   Focus on Therapeutic Outcomes (FOTO)  55 (Risk adjusted 45); predicted 60      Posture/Postural Control   Posture/Postural Control Postural limitations    Postural Limitations Increased thoracic kyphosis;Right pelvic obliquity;Rounded Shoulders      ROM / Strength   AROM / PROM / Strength AROM;Strength      AROM   AROM Assessment Site Lumbar    Lumbar Flexion ~2" from floor    Lumbar Extension ~25%    Lumbar - Right Side Bend At knee joint    Lumbar - Left Side Bend ~1" above knee joint    Lumbar - Right Rotation Limited    Lumbar - Left Rotation Limited      Strength   Strength Assessment Site Hip    Right/Left Hip Right;Left    Right Hip Flexion 3+/5    Right Hip Extension 3+/5    Right Hip ABduction  3+/5    Left Hip Flexion 3+/5    Left Hip Extension 3+/5    Left Hip ABduction 3+/5      Palpation   Spinal mobility WFL    SI assessment  L iliac crest higher R    Palpation comment TTP R>L QL, bilat thoracic and lumbar paraspinals, R>L rhomboids/low trap      Special Tests    Special Tests Lumbar    Lumbar Tests FABER test;Straight Leg Raise;Prone Knee Bend Test      FABER test   findings Negative    Comment R hip tighter than L      Prone Knee Bend Test   Findings Negative      Straight Leg Raise   Findings Negative    Comment 90 deg bilat                        Objective measurements completed on examination: See above findings.       [facility] Adult PT Treatment/Exercise - 01/27/21 0001       Exercises   Exercises Lumbar      Lumbar Exercises: Stretches   Lower Trunk Rotation 3 reps;20 seconds    Other Lumbar Stretch Exercise L stretch with side flexion x20 sec      Lumbar Exercises: Supine   Pelvic Tilt 10 reps    Bridge Compliant;10 reps      Lumbar Exercises: Sidelying   Clam Right;Left;10 reps    Clam Limitations red tband                     PT Education - 01/27/21 1354     Education Details Exam findings, HEP, and POC    Person(s) Educated Patient    Methods Explanation;Demonstration;Tactile cues;Verbal cues;Handout    Comprehension Verbalized understanding;Returned demonstration;Verbal cues required;Tactile cues required;Need further instruction                 PT Long Term Goals - 01/27/21 1359       PT LONG TERM GOAL #1   Title Pt will be ind with HEP    Time 6    Period Weeks  Status New    Target Date 03/10/21      PT LONG TERM GOAL #2   Title Pt will report decrease in pain with activity by at least 50%    Time 6    Period Weeks    Status New    Target Date 03/10/21      PT LONG TERM GOAL #3   Title Pt will be able to demo at least 4/5 hip strength bilat    Baseline 3+/5 grossly in hips     Time 6    Period Weeks    Status New    Target Date 03/10/21      PT LONG TERM GOAL #4   Title Pt will demo improved FOTO score to at least 60    Baseline 55    Time 6    Period Weeks    Status New    Target Date 03/10/21                    Plan - 01/27/21 1354     Clinical Impression Statement Ms. Laberta Wilbon is an 80 y/o F presenting to OPPT due to LBP and degenerative scoliosis. On assessment, pt demos decreased lumbar ROM with very weak hips bilat. Posturally, pt demos L iliac rest higher than R with increased thoracic kyphosis. Pt demos TTP and taut thoracolumbar paraspinals and QL. Pt's pain centers primarily near origin of R QL. Pt would highly benefit from PT to address these issues for reduced pain with all of her activity.    Personal Factors and Comorbidities Age;Fitness;Time since onset of injury/illness/exacerbation    Examination-Activity Limitations Locomotion Level;Squat;Stand;Stairs    Examination-Participation Restrictions Meal Prep;Community Activity;Cleaning    Stability/Clinical Decision Making Evolving/Moderate complexity    Clinical Decision Making Moderate    Rehab Potential Good    PT Frequency 2x / week    PT Duration 6 weeks    PT Treatment/Interventions ADLs/Self Care Home Management;Aquatic Therapy;Electrical Stimulation;Iontophoresis 4mg /ml Dexamethasone;Moist Heat;DME Instruction;Gait training;Stair training;Functional mobility training;Therapeutic activities;Therapeutic exercise;Balance training;Neuromuscular re-education;Manual techniques;Patient/family education;Passive range of motion;Dry needling;Taping    PT Next Visit Plan Assess response to HEP and modify accordingly. Progress hip and core strengthening. Initiate periscapular strengthening. Discuss postural stabilization. Manual therapy/TPDN as needed for QL/paraspinals.    PT Home Exercise Plan Access Code: [facility]    Consulted and Agree with Plan of Care Patient             Patient  will benefit from skilled therapeutic intervention in order to improve the following deficits and impairments:  Decreased range of motion, Increased fascial restricitons, Increased muscle spasms, Pain, Decreased activity tolerance, Improper body mechanics, Hypomobility, Decreased mobility, Decreased strength, Postural dysfunction  Visit Diagnosis: Muscle weakness (generalized)  Chronic bilateral low back pain, unspecified whether sciatica present  Pain in thoracic spine  Abnormal posture  Other abnormalities of gait and mobility     Problem List Patient Active Problem List   Diagnosis Date Noted   SVT (supraventricular tachycardia) ([place]) 07/12/2020   Osteopenia 04/15/2020   Unilateral primary osteoarthritis, left knee 05/04/2019   Unilateral primary osteoarthritis, right knee 05/04/2019   CKD (chronic kidney disease) stage 2, GFR 60-89 ml/min 01/14/2017   Depression, major, in remission ([place]) 11/27/2014   Overweight (BMI 25.0-29.9) 04/10/2014   Insomnia 04/10/2014   Vitamin D deficiency 01/07/2014   Abnormal glucose 01/03/2014   Medication management 01/03/2014   Anxiety    Morton's neuroma of left foot    Osteoarthritis  Hyperlipidemia, mixed 06/05/2009   Essential hypertension 06/05/2009    [facility] April Ma L Chambers, [place], DPT 01/27/2021, 2:07 PM  [address]. [place], [place], [address] Phone: [phone]   Fax:  [phone]  Name: KASSIA DEMARINIS MRN: [id number] Date of Birth: 03/01/1940

## 2021-01-27 NOTE — Progress Notes (Signed)
Please refer to [facility] asap for lumbar spine ESI.  She's in [facility] [place] and she's in a lot of pain.  Thanks.

## 2021-01-28 ENCOUNTER — Other Ambulatory Visit: Payer: Self-pay

## 2021-01-28 DIAGNOSIS — M545 Low back pain, unspecified: Secondary | ICD-10-CM

## 2021-01-30 ENCOUNTER — Telehealth: Payer: Self-pay | Admitting: Physical Medicine and Rehabilitation

## 2021-01-30 NOTE — Telephone Encounter (Signed)
Patient needs to reschedule her appt on January 18th for a later time that day. Please advise.

## 2021-02-04 ENCOUNTER — Ambulatory Visit: Payer: Medicare Other | Admitting: Physical Therapy

## 2021-02-04 ENCOUNTER — Other Ambulatory Visit: Payer: Self-pay

## 2021-02-04 ENCOUNTER — Encounter: Payer: Self-pay | Admitting: Physical Therapy

## 2021-02-04 DIAGNOSIS — G8929 Other chronic pain: Secondary | ICD-10-CM

## 2021-02-04 DIAGNOSIS — M6281 Muscle weakness (generalized): Secondary | ICD-10-CM

## 2021-02-04 DIAGNOSIS — M546 Pain in thoracic spine: Secondary | ICD-10-CM

## 2021-02-04 DIAGNOSIS — R293 Abnormal posture: Secondary | ICD-10-CM

## 2021-02-04 NOTE — Therapy (Signed)
[place] [place], [place], [address] Phone: [phone]   Fax:  [phone]  Physical Therapy Treatment  Patient Details  Name: Kelsey Lopez MRN: [id number] Date of Birth: 1940-03-23 Referring Provider (PT): Leandrew Koyanagi, MD   Encounter Date: 02/04/2021   PT End of Session - 02/04/21 1509     Visit Number 2    Date for PT Re-Evaluation 03/10/21    Authorization Type UHC Medicare    PT Start Time 1430    PT Stop Time 1511    PT Time Calculation (min) 41 min    Activity Tolerance Patient tolerated treatment well    Behavior During Therapy [facility] for tasks assessed/performed             Past Medical History:  Diagnosis Date   Anxiety    Dyslipidemia    on Rx x20 years   HTN (hypertension)    Hyperlipidemia    Insomnia    Morton's neuroma of left foot    Multiple allergies    Osteoarthritis    Polycythemia    PVC (premature ventricular contraction)     Past Surgical History:  Procedure Laterality Date   ABDOMINAL HYSTERECTOMY     CATARACT EXTRACTION Right 2019   Dr. Ellie Lunch    KNEE ARTHROSCOPY     R   TONSILLECTOMY     VESICOVAGINAL FISTULA CLOSURE W/ TAH      There were no vitals filed for this visit.   Subjective Assessment - 02/04/21 1435     Subjective "No good at all" Miserable most of the time. happy when not completely miserable.    Currently in Pain? Yes    Pain Score 6     Pain Location Back                               [facility] Adult PT Treatment/Exercise - 02/04/21 0001       Lumbar Exercises: Aerobic   UBE (Upper Arm Bike) L1 x 2.5 min each      Lumbar Exercises: Standing   Shoulder Extension Strengthening;20 reps;Both    Theraband Level (Shoulder Extension) Level 3 (Green)      Lumbar Exercises: Seated   Sit to Stand 10 reps;5 reps    Other Seated Lumbar Exercises ER yellow 2x10    Other Seated Lumbar Exercises Rows green 2x10; OHP red ball  2x10      Lumbar Exercises: Supine   Straight Leg Raise 10 reps;2 seconds                          PT Long Term Goals - 02/04/21 1511       PT LONG TERM GOAL #1   Title Pt will be ind with HEP    Status On-going                   Plan - 02/04/21 1511     Clinical Impression Statement Pt enters clinic reporting 6/10 back pain. She only reports relief when home on heating pad. She has not been compliant with HEP. She reports an injection january 10. She tolerated an initial progression to TE fair. She reports pain throughout but no increase in pain form the interventions. Postural cue required to prevent trunk flexion with shoulder extensions.    Personal Factors and Comorbidities Age;Fitness;Time since onset of injury/illness/exacerbation  Examination-Activity Limitations Locomotion Level;Squat;Stand;Stairs    Examination-Participation Restrictions Meal Prep;Community Activity;Cleaning    Stability/Clinical Decision Making Evolving/Moderate complexity    Rehab Potential Good    PT Frequency 2x / week    PT Duration 6 weeks    PT Treatment/Interventions ADLs/Self Care Home Management;Aquatic Therapy;Electrical Stimulation;Iontophoresis 4mg /ml Dexamethasone;Moist Heat;DME Instruction;Gait training;Stair training;Functional mobility training;Therapeutic activities;Therapeutic exercise;Balance training;Neuromuscular re-education;Manual techniques;Patient/family education;Passive range of motion;Dry needling;Taping    PT Next Visit Plan Assess response to HEP and modify accordingly. Progress hip and core strengthening. Initiate periscapular strengthening. Discuss postural stabilization. Manual therapy/TPDN as needed for QL/paraspinals.             Patient will benefit from skilled therapeutic intervention in order to improve the following deficits and impairments:  Decreased range of motion, Increased fascial restricitons, Increased muscle spasms, Pain,  Decreased activity tolerance, Improper body mechanics, Hypomobility, Decreased mobility, Decreased strength, Postural dysfunction  Visit Diagnosis: Muscle weakness (generalized)  Pain in thoracic spine  Chronic bilateral low back pain, unspecified whether sciatica present  Abnormal posture     Problem List Patient Active Problem List   Diagnosis Date Noted   SVT (supraventricular tachycardia) ([place]) 07/12/2020   Osteopenia 04/15/2020   Unilateral primary osteoarthritis, left knee 05/04/2019   Unilateral primary osteoarthritis, right knee 05/04/2019   CKD (chronic kidney disease) stage 2, GFR 60-89 ml/min 01/14/2017   Depression, major, in remission ([place]) 11/27/2014   Overweight (BMI 25.0-29.9) 04/10/2014   Insomnia 04/10/2014   Vitamin D deficiency 01/07/2014   Abnormal glucose 01/03/2014   Medication management 01/03/2014   Anxiety    Morton's neuroma of left foot    Osteoarthritis    Hyperlipidemia, mixed 06/05/2009   Essential hypertension 06/05/2009    Scot Jun, PTA 02/04/2021, 3:14 PM  [place]. [place], [place], [address] Phone: [phone]   Fax:  [phone]  Name: Kelsey Lopez MRN: [id number] Date of Birth: May 09, 1940

## 2021-02-09 ENCOUNTER — Other Ambulatory Visit: Payer: Self-pay | Admitting: Nurse Practitioner

## 2021-02-09 DIAGNOSIS — F419 Anxiety disorder, unspecified: Secondary | ICD-10-CM

## 2021-02-09 DIAGNOSIS — I10 Essential (primary) hypertension: Secondary | ICD-10-CM

## 2021-02-13 ENCOUNTER — Encounter: Payer: Self-pay | Admitting: Physical Therapy

## 2021-02-13 ENCOUNTER — Other Ambulatory Visit: Payer: Self-pay

## 2021-02-13 ENCOUNTER — Ambulatory Visit: Payer: Medicare Other | Attending: Orthopaedic Surgery | Admitting: Physical Therapy

## 2021-02-13 DIAGNOSIS — R2689 Other abnormalities of gait and mobility: Secondary | ICD-10-CM

## 2021-02-13 DIAGNOSIS — R293 Abnormal posture: Secondary | ICD-10-CM | POA: Diagnosis not present

## 2021-02-13 DIAGNOSIS — M545 Low back pain, unspecified: Secondary | ICD-10-CM | POA: Diagnosis not present

## 2021-02-13 DIAGNOSIS — M6281 Muscle weakness (generalized): Secondary | ICD-10-CM

## 2021-02-13 DIAGNOSIS — G8929 Other chronic pain: Secondary | ICD-10-CM

## 2021-02-13 DIAGNOSIS — M546 Pain in thoracic spine: Secondary | ICD-10-CM | POA: Diagnosis not present

## 2021-02-13 NOTE — Therapy (Signed)
[place]. [place], [place], [address] Phone: [phone]   Fax:  [phone]  Physical Therapy Treatment  Patient Details  Name: Kelsey Lopez MRN: [id number] Date of Birth: 1940-06-27 Referring Provider (PT): Leandrew Koyanagi, MD   Encounter Date: 02/13/2021   PT End of Session - 02/13/21 1035     Visit Number 3    Number of Visits 12    Date for PT Re-Evaluation 03/10/21    Authorization Type UHC Medicare    PT Start Time 1018    PT Stop Time 1058    PT Time Calculation (min) 40 min    Activity Tolerance Patient tolerated treatment well    Behavior During Therapy [facility] for tasks assessed/performed             Past Medical History:  Diagnosis Date   Anxiety    Dyslipidemia    on Rx x20 years   HTN (hypertension)    Hyperlipidemia    Insomnia    Morton's neuroma of left foot    Multiple allergies    Osteoarthritis    Polycythemia    PVC (premature ventricular contraction)     Past Surgical History:  Procedure Laterality Date   ABDOMINAL HYSTERECTOMY     CATARACT EXTRACTION Right 2019   Dr. Ellie Lunch    KNEE ARTHROSCOPY     R   TONSILLECTOMY     VESICOVAGINAL FISTULA CLOSURE W/ TAH      There were no vitals filed for this visit.   Subjective Assessment - 02/13/21 1022     Subjective Patient reports pain continues. She is performing her HEP as regularly as she can. Looking forward to her injections.    Pertinent History knee issues    Currently in Pain? Yes    Pain Score 6     Pain Location Back    Pain Orientation Right;Left;Mid    Pain Descriptors / Indicators Aching;Constant    Pain Type Chronic pain    Pain Onset More than a month ago    Pain Frequency Constant                               [facility] Adult PT Treatment/Exercise - 02/13/21 0001       Lumbar Exercises: Stretches   Single Knee to Chest Stretch Right;Left;3 reps;20 seconds    Double Knee to Chest  Stretch 3 reps;20 seconds    Lower Trunk Rotation 5 reps;20 seconds      Lumbar Exercises: Aerobic   Stationary Bike L3.5 x 5 minutes.      Lumbar Exercises: Seated   Other Seated Lumbar Exercises Pelvic tilts x 5      Lumbar Exercises: Supine   Pelvic Tilt 10 reps    Pelvic Tilt Limitations Held Pelvic tilt while marching x 10 with each leg.    Clam 20 reps;1 second    Clam Limitations red Tband    Dead Bug 5 reps;5 seconds    Dead Bug Limitations Patient was not able to reach with arms or legs, but held the start positoin 5 x 10 seconds.    Bridge with Greig Right Non-compliant;20 reps;1 second                     PT Education - 02/13/21 1034     Education Details Encouraged to perform HEP for long term benefits.  Person(s) Educated Patient    Methods Explanation    Comprehension Verbalized understanding                 PT Long Term Goals - 02/13/21 1037       PT LONG TERM GOAL #1   Title Pt will be ind with HEP    Time 4    Period Weeks    Status On-going    Target Date 03/10/21      PT LONG TERM GOAL #2   Title Pt will report decrease in pain with activity by at least 50%    Time 4    Period Weeks    Status On-going    Target Date 03/10/21                   Plan - 02/13/21 1035     Clinical Impression Statement Patient reprots continued back pain. She is using a heating pad a lot, but trying to perform her HEP. Therapist encouraged her to continue with it as much as possible. Patient hopeful that when she gets her injections, it will help control the pain better. She participated well in treatment activities and reported decreased pain after treatment.    Personal Factors and Comorbidities Age;Fitness;Time since onset of injury/illness/exacerbation    Examination-Activity Limitations Locomotion Level;Squat;Stand;Stairs    Examination-Participation Restrictions Meal Prep;Community Activity;Cleaning    Stability/Clinical Decision  Making Evolving/Moderate complexity    Rehab Potential Good    PT Frequency 2x / week    PT Duration 6 weeks    PT Treatment/Interventions ADLs/Self Care Home Management;Aquatic Therapy;Electrical Stimulation;Iontophoresis 4mg /ml Dexamethasone;Moist Heat;DME Instruction;Gait training;Stair training;Functional mobility training;Therapeutic activities;Therapeutic exercise;Balance training;Neuromuscular re-education;Manual techniques;Patient/family education;Passive range of motion;Dry needling;Taping    PT Next Visit Plan Assess response to HEP and modify accordingly. Progress hip and core strengthening. Initiate periscapular strengthening. Discuss postural stabilization. Manual therapy/TPDN as needed for QL/paraspinals.    Consulted and Agree with Plan of Care Patient             Patient will benefit from skilled therapeutic intervention in order to improve the following deficits and impairments:  Decreased range of motion, Increased fascial restricitons, Increased muscle spasms, Pain, Decreased activity tolerance, Improper body mechanics, Hypomobility, Decreased mobility, Decreased strength, Postural dysfunction  Visit Diagnosis: Muscle weakness (generalized)  Pain in thoracic spine  Chronic bilateral low back pain, unspecified whether sciatica present  Abnormal posture  Other abnormalities of gait and mobility     Problem List Patient Active Problem List   Diagnosis Date Noted   SVT (supraventricular tachycardia) ([place]) 07/12/2020   Osteopenia 04/15/2020   Unilateral primary osteoarthritis, left knee 05/04/2019   Unilateral primary osteoarthritis, right knee 05/04/2019   CKD (chronic kidney disease) stage 2, GFR 60-89 ml/min 01/14/2017   Depression, major, in remission ([place]) 11/27/2014   Overweight (BMI 25.0-29.9) 04/10/2014   Insomnia 04/10/2014   Vitamin D deficiency 01/07/2014   Abnormal glucose 01/03/2014   Medication management 01/03/2014   Anxiety    Morton's  neuroma of left foot    Osteoarthritis    Hyperlipidemia, mixed 06/05/2009   Essential hypertension 06/05/2009    Kelsey Lopez, DPT 02/13/2021, 11:00 AM  [place]. [place], [place], [address] Phone: [phone]   Fax:  [phone]  Name: Kelsey Lopez MRN: [id number] Date of Birth: [birth year]

## 2021-02-16 ENCOUNTER — Other Ambulatory Visit: Payer: Self-pay

## 2021-02-16 ENCOUNTER — Telehealth: Payer: Self-pay | Admitting: *Deleted

## 2021-02-16 ENCOUNTER — Encounter: Payer: Self-pay | Admitting: Internal Medicine

## 2021-02-16 ENCOUNTER — Ambulatory Visit: Payer: Medicare Other | Admitting: Physical Therapy

## 2021-02-16 ENCOUNTER — Encounter: Payer: Self-pay | Admitting: Physical Therapy

## 2021-02-16 DIAGNOSIS — M545 Low back pain, unspecified: Secondary | ICD-10-CM | POA: Diagnosis not present

## 2021-02-16 DIAGNOSIS — Z1211 Encounter for screening for malignant neoplasm of colon: Secondary | ICD-10-CM

## 2021-02-16 DIAGNOSIS — G8929 Other chronic pain: Secondary | ICD-10-CM

## 2021-02-16 DIAGNOSIS — M6281 Muscle weakness (generalized): Secondary | ICD-10-CM

## 2021-02-16 DIAGNOSIS — M546 Pain in thoracic spine: Secondary | ICD-10-CM

## 2021-02-16 DIAGNOSIS — R293 Abnormal posture: Secondary | ICD-10-CM | POA: Diagnosis not present

## 2021-02-16 DIAGNOSIS — R2689 Other abnormalities of gait and mobility: Secondary | ICD-10-CM | POA: Diagnosis not present

## 2021-02-16 DIAGNOSIS — Z1212 Encounter for screening for malignant neoplasm of rectum: Secondary | ICD-10-CM

## 2021-02-16 LAB — POC HEMOCCULT BLD/STL (HOME/3-CARD/SCREEN)
Card #2 Fecal Occult Blod, POC: NEGATIVE
Card #3 Fecal Occult Blood, POC: NEGATIVE
Fecal Occult Blood, POC: NEGATIVE

## 2021-02-16 NOTE — Telephone Encounter (Signed)
I called [facility] to see if can get pt in sooner than 02/19/21 that she is previously scheduled with [facility].

## 2021-02-16 NOTE — Therapy (Signed)
[place]. [place], [place], [address] Phone: [phone]   Fax:  [phone]  Physical Therapy Treatment  Patient Details  Name: Kelsey Lopez MRN: [id number] Date of Birth: 02/18/1940 Referring Provider (PT): Leandrew Koyanagi, MD   Encounter Date: 02/16/2021   PT End of Session - 02/16/21 1511     Visit Number 4    Date for PT Re-Evaluation 03/10/21    Authorization Type UHC Medicare    PT Start Time 1430    PT Stop Time 1513    PT Time Calculation (min) 43 min    Activity Tolerance Patient tolerated treatment well    Behavior During Therapy [facility] for tasks assessed/performed             Past Medical History:  Diagnosis Date   Anxiety    Dyslipidemia    on Rx x20 years   HTN (hypertension)    Hyperlipidemia    Insomnia    Morton's neuroma of left foot    Multiple allergies    Osteoarthritis    Polycythemia    PVC (premature ventricular contraction)     Past Surgical History:  Procedure Laterality Date   ABDOMINAL HYSTERECTOMY     CATARACT EXTRACTION Right 2019   Dr. Ellie Lunch    KNEE ARTHROSCOPY     R   TONSILLECTOMY     VESICOVAGINAL FISTULA CLOSURE W/ TAH      There were no vitals filed for this visit.   Subjective Assessment - 02/16/21 1429     Subjective "Doing well all things considered"    Currently in Pain? No/denies   laid on the heating pad                              [facility] Adult PT Treatment/Exercise - 02/16/21 0001       Lumbar Exercises: Stretches   Passive Hamstring Stretch Right;Left;3 reps;20 seconds;10 seconds    Single Knee to Chest Stretch Right;Left;3 reps;10 seconds;20 seconds    Lower Trunk Rotation 3 reps;10 seconds;20 seconds      Lumbar Exercises: Aerobic   Nustep L4 x 6 min      Lumbar Exercises: Machines for Strengthening   Other Lumbar Machine Exercise Rows & Lats 20lb 2x10      Lumbar Exercises: Standing   Shoulder Extension  Strengthening;Power Tower;Both;5 reps   x3   Shoulder Extension Limitations 5      Lumbar Exercises: Seated   Sit to Stand 5 reps   x3 sets   Other Seated Lumbar Exercises ab sets 2x10     Other Seated Lumbar Exercises Rows green 2x10      Lumbar Exercises: Supine   Bridge Compliant;10 reps;2 seconds    Other Supine Lumbar Exercises LE on pball bridges, K2C, Oblq                          PT Long Term Goals - 02/13/21 1037       PT LONG TERM GOAL #1   Title Pt will be ind with HEP    Time 4    Period Weeks    Status On-going    Target Date 03/10/21      PT LONG TERM GOAL #2   Title Pt will report decrease in pain with activity by at least 50%    Time 4    Period  Weeks    Status On-going    Target Date 03/10/21                   Plan - 02/16/21 1512     Clinical Impression Statement Pt enters clinic feeling good. Pt had no issues completing today's interventions. Bilateral knee valgus stress noted with sit to stands. Cue needed to maintain sequencing with step ups, some initial instability also noted with step ups. Cue for core engagement needed with ab sets and supine bridges. Tactile cue to prevent posterior trunk leaning with lat pull downs.    Personal Factors and Comorbidities Age;Fitness;Time since onset of injury/illness/exacerbation    Examination-Activity Limitations Locomotion Level;Squat;Stand;Stairs    Examination-Participation Restrictions Meal Prep;Community Activity;Cleaning    Stability/Clinical Decision Making Evolving/Moderate complexity    Rehab Potential Good    PT Duration 6 weeks    PT Treatment/Interventions ADLs/Self Care Home Management;Aquatic Therapy;Electrical Stimulation;Iontophoresis 4mg /ml Dexamethasone;Moist Heat;DME Instruction;Gait training;Stair training;Functional mobility training;Therapeutic activities;Therapeutic exercise;Balance training;Neuromuscular re-education;Manual techniques;Patient/family  education;Passive range of motion;Dry needling;Taping    PT Next Visit Plan Assess response to HEP and modify accordingly. Progress hip and core strengthening. Initiate periscapular strengthening. Discuss postural stabilization. Manual therapy/TPDN as needed for QL/paraspinals.             Patient will benefit from skilled therapeutic intervention in order to improve the following deficits and impairments:  Decreased range of motion, Increased fascial restricitons, Increased muscle spasms, Pain, Decreased activity tolerance, Improper body mechanics, Hypomobility, Decreased mobility, Decreased strength, Postural dysfunction  Visit Diagnosis: Muscle weakness (generalized)  Chronic bilateral low back pain, unspecified whether sciatica present  Abnormal posture  Pain in thoracic spine     Problem List Patient Active Problem List   Diagnosis Date Noted   SVT (supraventricular tachycardia) ([place]) 07/12/2020   Osteopenia 04/15/2020   Unilateral primary osteoarthritis, left knee 05/04/2019   Unilateral primary osteoarthritis, right knee 05/04/2019   CKD (chronic kidney disease) stage 2, GFR 60-89 ml/min 01/14/2017   Depression, major, in remission ([place]) 11/27/2014   Overweight (BMI 25.0-29.9) 04/10/2014   Insomnia 04/10/2014   Vitamin D deficiency 01/07/2014   Abnormal glucose 01/03/2014   Medication management 01/03/2014   Anxiety    Morton's neuroma of left foot    Osteoarthritis    Hyperlipidemia, mixed 06/05/2009   Essential hypertension 06/05/2009    Scot Jun, PTA 02/16/2021, 3:14 PM  [place]. [place], [place], [address] Phone: [phone]   Fax:  [phone]  Name: Kelsey Lopez MRN: [id number] Date of Birth: December 16, 1940

## 2021-02-17 ENCOUNTER — Ambulatory Visit: Payer: Medicare Other | Admitting: Physical Medicine and Rehabilitation

## 2021-02-17 ENCOUNTER — Ambulatory Visit: Payer: Self-pay

## 2021-02-17 ENCOUNTER — Encounter: Payer: Self-pay | Admitting: Physical Medicine and Rehabilitation

## 2021-02-17 VITALS — BP 176/69 | HR 68

## 2021-02-17 DIAGNOSIS — M5416 Radiculopathy, lumbar region: Secondary | ICD-10-CM

## 2021-02-17 MED ORDER — METHYLPREDNISOLONE ACETATE 80 MG/ML IJ SUSP
80.0000 mg | Freq: Once | INTRAMUSCULAR | Status: AC
  Administered 2021-02-17: 80 mg

## 2021-02-17 NOTE — Progress Notes (Signed)
Pt state lower back pain. Pt state any movement makes the pain worse. Pt state she takes pain meds to help ease her pain.   Numeric Pain Rating Scale and Functional Assessment Average Pain 2   In the last MONTH (on 0-10 scale) has pain interfered with the following?  1. General activity like being  able to carry out your everyday physical activities such as walking, climbing stairs, carrying groceries, or moving a chair?  Rating(10)   +Driver, -BT, -Dye Allergies.

## 2021-02-17 NOTE — Patient Instructions (Signed)

## 2021-02-20 ENCOUNTER — Encounter: Payer: Self-pay | Admitting: Physical Therapy

## 2021-02-20 ENCOUNTER — Other Ambulatory Visit: Payer: Self-pay

## 2021-02-20 ENCOUNTER — Ambulatory Visit: Payer: Medicare Other | Admitting: Physical Therapy

## 2021-02-20 DIAGNOSIS — M6281 Muscle weakness (generalized): Secondary | ICD-10-CM

## 2021-02-20 DIAGNOSIS — G8929 Other chronic pain: Secondary | ICD-10-CM | POA: Diagnosis not present

## 2021-02-20 DIAGNOSIS — R293 Abnormal posture: Secondary | ICD-10-CM | POA: Diagnosis not present

## 2021-02-20 DIAGNOSIS — M545 Low back pain, unspecified: Secondary | ICD-10-CM | POA: Diagnosis not present

## 2021-02-20 DIAGNOSIS — M546 Pain in thoracic spine: Secondary | ICD-10-CM

## 2021-02-20 DIAGNOSIS — R2689 Other abnormalities of gait and mobility: Secondary | ICD-10-CM | POA: Diagnosis not present

## 2021-02-20 NOTE — Therapy (Signed)
[place]. [place], [place], [address] Phone: [phone]   Fax:  [phone]  Physical Therapy Treatment  Patient Details  Name: Kelsey Lopez MRN: [id number] Date of Birth: 11/27/1940 Referring Provider (PT): Leandrew Koyanagi, MD   Encounter Date: 02/20/2021   PT End of Session - 02/20/21 0924     Visit Number 5    Date for PT Re-Evaluation 03/10/21    Authorization Type UHC Medicare    PT Start Time 0845    PT Stop Time 0925    PT Time Calculation (min) 40 min    Activity Tolerance Patient tolerated treatment well    Behavior During Therapy [facility] for tasks assessed/performed             Past Medical History:  Diagnosis Date   Anxiety    Dyslipidemia    on Rx x20 years   HTN (hypertension)    Hyperlipidemia    Insomnia    Morton's neuroma of left foot    Multiple allergies    Osteoarthritis    Polycythemia    PVC (premature ventricular contraction)     Past Surgical History:  Procedure Laterality Date   ABDOMINAL HYSTERECTOMY     CATARACT EXTRACTION Right 2019   Dr. Ellie Lunch    KNEE ARTHROSCOPY     R   TONSILLECTOMY     VESICOVAGINAL FISTULA CLOSURE W/ TAH      There were no vitals filed for this visit.   Subjective Assessment - 02/20/21 0844     Subjective injection Tuesday and it has helped. Has not had that constant pain.    Currently in Pain? Yes    Pain Score 1     Pain Location Back                               [facility] Adult PT Treatment/Exercise - 02/20/21 0001       Lumbar Exercises: Aerobic   Nustep L4 x 6 min      Lumbar Exercises: Seated   Other Seated Lumbar Exercises Rows green 2x10      Lumbar Exercises: Supine   Ab Set 10 reps;2 seconds   x2   Bridge Compliant;10 reps;2 seconds    Other Supine Lumbar Exercises LE on pball bridges, K2C, Oblq      Manual Therapy   Manual Therapy Scapular mobilization;Myofascial release    Scapular  Mobilization Lumbar para spinales multifidi                          PT Long Term Goals - 02/20/21 0925       PT LONG TERM GOAL #1   Title Pt will be ind with HEP    Status Achieved      PT LONG TERM GOAL #2   Title Pt will report decrease in pain with activity by at least 50%    Status Partially Met                   Plan - 02/20/21 0925     Clinical Impression Statement Pt reports improvement from her recent injection. Continues with posterior chain strengthening with the addition of STM to lumbar spine. Cue for core engagement needed with supine bridges. Decrease trunk stability with supine bridges LE on Pball. Positive response to MT.    Personal Factors and Comorbidities Age;Fitness;Time  since onset of injury/illness/exacerbation    Examination-Activity Limitations Locomotion Level;Squat;Stand;Stairs    Examination-Participation Restrictions Meal Prep;Community Activity;Cleaning    Stability/Clinical Decision Making Evolving/Moderate complexity    Rehab Potential Good    PT Frequency 2x / week    PT Duration 6 weeks    PT Treatment/Interventions ADLs/Self Care Home Management;Aquatic Therapy;Electrical Stimulation;Iontophoresis 46m/ml Dexamethasone;Moist Heat;DME Instruction;Gait training;Stair training;Functional mobility training;Therapeutic activities;Therapeutic exercise;Balance training;Neuromuscular re-education;Manual techniques;Patient/family education;Passive range of motion;Dry needling;Taping    PT Next Visit Plan Assess response to HEP and modify accordingly. Progress hip and core strengthening. Initiate periscapular strengthening. Discuss postural stabilization. Manual therapy/TPDN as needed for QL/paraspinals.             Patient will benefit from skilled therapeutic intervention in order to improve the following deficits and impairments:  Decreased range of motion, Increased fascial restricitons, Increased muscle spasms, Pain, Decreased  activity tolerance, Improper body mechanics, Hypomobility, Decreased mobility, Decreased strength, Postural dysfunction  Visit Diagnosis: Muscle weakness (generalized)  Chronic bilateral low back pain, unspecified whether sciatica present  Abnormal posture  Pain in thoracic spine     Problem List Patient Active Problem List   Diagnosis Date Noted   [place]) [place] 07/12/2020   Osteopenia 04/15/2020   Unilateral primary osteoarthritis, left knee 05/04/2019   Unilateral primary osteoarthritis, right knee 05/04/2019   CKD (chronic kidney disease) stage 2, GFR 60-89 ml/min 01/14/2017   Depression, major, in remission ([place] 11/27/2014   Overweight (BMI 25.0-29.9) 04/10/2014   Insomnia 04/10/2014   Vitamin D deficiency 01/07/2014   Abnormal glucose 01/03/2014   Medication management 01/03/2014   Anxiety    Morton's neuroma of left foot    Osteoarthritis    Hyperlipidemia, mixed 06/05/2009   Essential hypertension 06/05/2009    RScot Jun PTA 02/20/2021, 9:28 AM  [place] [address]Phone: [phone]  Fax:  [phone] Name: Kelsey KASPAREKMRN: [id number] of Birth: 9Oct 11, 1942

## 2021-02-23 ENCOUNTER — Ambulatory Visit: Payer: Medicare Other | Admitting: Physical Therapy

## 2021-02-23 ENCOUNTER — Other Ambulatory Visit: Payer: Self-pay

## 2021-02-23 ENCOUNTER — Encounter: Payer: Self-pay | Admitting: Physical Therapy

## 2021-02-23 DIAGNOSIS — M6281 Muscle weakness (generalized): Secondary | ICD-10-CM | POA: Diagnosis not present

## 2021-02-23 DIAGNOSIS — G8929 Other chronic pain: Secondary | ICD-10-CM | POA: Diagnosis not present

## 2021-02-23 DIAGNOSIS — M546 Pain in thoracic spine: Secondary | ICD-10-CM | POA: Diagnosis not present

## 2021-02-23 DIAGNOSIS — R2689 Other abnormalities of gait and mobility: Secondary | ICD-10-CM | POA: Diagnosis not present

## 2021-02-23 DIAGNOSIS — R293 Abnormal posture: Secondary | ICD-10-CM | POA: Diagnosis not present

## 2021-02-23 DIAGNOSIS — M545 Low back pain, unspecified: Secondary | ICD-10-CM | POA: Diagnosis not present

## 2021-02-23 NOTE — Therapy (Signed)
[place] [address]. [place], [place], [address] Phone: [phone]   Fax:  [phone]  Physical Therapy Treatment  Patient Details  Name: Kelsey Lopez MRN: [id number] Date of Birth: [birth year] Referring Provider (PT): Leandrew Koyanagi, MD   Encounter Date: 02/23/2021   PT End of Session - 02/23/21 1427     Visit Number 5    Date for PT Re-Evaluation 03/10/21    Authorization Type UHC Medicare    PT Start Time 1345    PT Stop Time 1428    PT Time Calculation (min) 43 min    Activity Tolerance Patient tolerated treatment well    Behavior During Therapy [facility] for tasks assessed/performed             Past Medical History:  Diagnosis Date   Anxiety    Dyslipidemia    on Rx x20 years   HTN (hypertension)    Hyperlipidemia    Insomnia    Morton's neuroma of left foot    Multiple allergies    Osteoarthritis    Polycythemia    PVC (premature ventricular contraction)     Past Surgical History:  Procedure Laterality Date   ABDOMINAL HYSTERECTOMY     CATARACT EXTRACTION Right 2019   Dr. Ellie Lunch    KNEE ARTHROSCOPY     R   TONSILLECTOMY     VESICOVAGINAL FISTULA CLOSURE W/ TAH      There were no vitals filed for this visit.   Subjective Assessment - 02/23/21 1344     Subjective Pain in one spot and 1 spot only.    Currently in Pain? Yes    Pain Score 2     Pain Location Back    Pain Orientation Right                               [facility] Adult PT Treatment/Exercise - 02/23/21 0001       Lumbar Exercises: Aerobic   Nustep L4 x 6 min      Lumbar Exercises: Machines for Strengthening   Cybex Knee Extension 5lb 2x10    Cybex Knee Flexion 20lb 2x10      Lumbar Exercises: Standing   Row Theraband;20 reps;Both    Theraband Level (Row) Level 3 (Green)    Shoulder Extension Strengthening;20 reps;Both    Theraband Level (Shoulder Extension) Level 3 (Green)      Lumbar Exercises:  Seated   Sit to Stand 10 reps;5 reps   OHP with yellow ball     Manual Therapy   Manual Therapy Soft tissue mobilization    Soft tissue mobilization Lumbar para spinales                          PT Long Term Goals - 02/20/21 0925       PT LONG TERM GOAL #1   Title Pt will be ind with HEP    Status Achieved      PT LONG TERM GOAL #2   Title Pt will report decrease in pain with activity by at least 50%    Status Partially Met                   Plan - 02/23/21 1428     Clinical Impression Statement Pt continues to report less overall pain from her recent injection. Pt is impulsive at times  with interventions requiring cues to slow down. Mod verbal and tactile cues required for posture with standing rows and extensions. Cue for eccentric control needed with leg curls and extensions. Again positive response to STM    Personal Factors and Comorbidities Age;Fitness;Time since onset of injury/illness/exacerbation    Examination-Activity Limitations Locomotion Level;Squat;Stand;Stairs    Examination-Participation Restrictions Meal Prep;Community Activity;Cleaning    Stability/Clinical Decision Making Evolving/Moderate complexity    Rehab Potential Good    PT Frequency 2x / week    PT Duration 6 weeks    PT Treatment/Interventions ADLs/Self Care Home Management;Aquatic Therapy;Electrical Stimulation;Iontophoresis 51m/ml Dexamethasone;Moist Heat;DME Instruction;Gait training;Stair training;Functional mobility training;Therapeutic activities;Therapeutic exercise;Balance training;Neuromuscular re-education;Manual techniques;Patient/family education;Passive range of motion;Dry needling;Taping    PT Next Visit Plan Assess response to HEP and modify accordingly. Progress hip and core strengthening. Initiate periscapular strengthening. Discuss postural stabilization. Manual therapy/TPDN as needed for QL/paraspinals.             Patient will benefit from skilled  therapeutic intervention in order to improve the following deficits and impairments:  Decreased range of motion, Increased fascial restricitons, Increased muscle spasms, Pain, Decreased activity tolerance, Improper body mechanics, Hypomobility, Decreased mobility, Decreased strength, Postural dysfunction  Visit Diagnosis: Muscle weakness (generalized)  Abnormal posture  Chronic bilateral low back pain, unspecified whether sciatica present     Problem List Patient Active Problem List   Diagnosis Date Noted   SVT (supraventricular tachycardia) ([place] 07/12/2020   Osteopenia 04/15/2020   Unilateral primary osteoarthritis, left knee 05/04/2019   Unilateral primary osteoarthritis, right knee 05/04/2019   CKD (chronic kidney disease) stage 2, GFR 60-89 ml/min 01/14/2017   Depression, major, in remission ([place] 11/27/2014   Overweight (BMI 25.0-29.9) 04/10/2014   Insomnia 04/10/2014   Vitamin D deficiency 01/07/2014   Abnormal glucose 01/03/2014   Medication management 01/03/2014   Anxiety    Morton's neuroma of left foot    Osteoarthritis    Hyperlipidemia, mixed 06/05/2009   Essential hypertension 06/05/2009    RScot Jun PTA 02/23/2021, 2:31 PM  [place] [address]Phone: [phone]  Fax:  [phone] Name: Kelsey DESROSIERMRN: [id number] of Birth: 908/23/1942

## 2021-02-25 ENCOUNTER — Ambulatory Visit: Payer: Medicare Other | Admitting: Physical Medicine and Rehabilitation

## 2021-02-27 ENCOUNTER — Encounter: Payer: Self-pay | Admitting: Physical Therapy

## 2021-02-27 ENCOUNTER — Other Ambulatory Visit: Payer: Self-pay

## 2021-02-27 ENCOUNTER — Ambulatory Visit: Payer: Medicare Other | Admitting: Physical Therapy

## 2021-02-27 DIAGNOSIS — G8929 Other chronic pain: Secondary | ICD-10-CM | POA: Diagnosis not present

## 2021-02-27 DIAGNOSIS — M546 Pain in thoracic spine: Secondary | ICD-10-CM | POA: Diagnosis not present

## 2021-02-27 DIAGNOSIS — R293 Abnormal posture: Secondary | ICD-10-CM | POA: Diagnosis not present

## 2021-02-27 DIAGNOSIS — M545 Low back pain, unspecified: Secondary | ICD-10-CM | POA: Diagnosis not present

## 2021-02-27 DIAGNOSIS — M6281 Muscle weakness (generalized): Secondary | ICD-10-CM

## 2021-02-27 DIAGNOSIS — R2689 Other abnormalities of gait and mobility: Secondary | ICD-10-CM | POA: Diagnosis not present

## 2021-02-27 NOTE — Therapy (Signed)
[place] [place]. [place], [place], [address] Phone: [phone]   Fax:  [phone]  Physical Therapy Treatment  Patient Details  Name: Kelsey Lopez MRN: [id number] Date of Birth: 23-Dec-1940 Referring Provider (PT): Leandrew Koyanagi, MD   Encounter Date: 02/27/2021   PT End of Session - 02/27/21 0928     Visit Number 6    Number of Visits 12    Date for PT Re-Evaluation 03/10/21    Authorization Type UHC Medicare    PT Start Time [phone]    PT Stop Time 0930    PT Time Calculation (min) 48 min    Activity Tolerance Patient tolerated treatment well    Behavior During Therapy [facility] for tasks assessed/performed             Past Medical History:  Diagnosis Date   Anxiety    Dyslipidemia    on Rx x20 years   HTN (hypertension)    Hyperlipidemia    Insomnia    Morton's neuroma of left foot    Multiple allergies    Osteoarthritis    Polycythemia    PVC (premature ventricular contraction)     Past Surgical History:  Procedure Laterality Date   ABDOMINAL HYSTERECTOMY     CATARACT EXTRACTION Right 2019   Dr. Ellie Lunch    KNEE ARTHROSCOPY     R   TONSILLECTOMY     VESICOVAGINAL FISTULA CLOSURE W/ TAH      There were no vitals filed for this visit.   Subjective Assessment - 02/27/21 0843     Subjective "ok" Still has that one spot on her back. Not on the heating pad as much    Currently in Pain? Yes    Pain Score 2     Pain Location Back    Pain Orientation Left                               [facility] Adult PT Treatment/Exercise - 02/27/21 0001       Lumbar Exercises: Aerobic   Nustep L4 x 6 min      Lumbar Exercises: Machines for Strengthening   Cybex Knee Extension 5lb 2x10    Cybex Knee Flexion 20lb 2x10    Other Lumbar Machine Exercise Rows & Lats 20lb 2x10      Lumbar Exercises: Standing   Shoulder Extension Strengthening;20 reps;Both    Theraband Level (Shoulder Extension)  Level 3 (Green)      Lumbar Exercises: Seated   Sit to Stand 10 reps   x2, OHP yellow ball     Lumbar Exercises: Supine   Bridge Compliant;20 reps;2 seconds      Manual Therapy   Manual Therapy Soft tissue mobilization    Soft tissue mobilization Lumbar para spinales                          PT Long Term Goals - 02/27/21 0931       PT LONG TERM GOAL #2   Title Pt will report decrease in pain with activity by at least 50%    Status Partially Met      PT LONG TERM GOAL #3   Status On-going      PT LONG TERM GOAL #4   Title Pt will demo improved FOTO score to at least 60    Status On-going  Plan - 02/27/21 0929     Clinical Impression Statement Pt reports improvement overall with decrease pain and better mobility. She reports less time laying around on her heating pad and she able to do more in a day. Impulsive at times with interventions requiring cues to slow down and maintain good posture. Tactile cue required to prevent trunk leaning with seated rows. She continues to respond well with MT.    Personal Factors and Comorbidities Age;Fitness;Time since onset of injury/illness/exacerbation    Examination-Activity Limitations Locomotion Level;Squat;Stand;Stairs    Examination-Participation Restrictions Meal Prep;Community Activity;Cleaning    Rehab Potential Good    PT Frequency 2x / week    PT Duration 6 weeks    PT Treatment/Interventions ADLs/Self Care Home Management;Aquatic Therapy;Electrical Stimulation;Iontophoresis 73m/ml Dexamethasone;Moist Heat;DME Instruction;Gait training;Stair training;Functional mobility training;Therapeutic activities;Therapeutic exercise;Balance training;Neuromuscular re-education;Manual techniques;Patient/family education;Passive range of motion;Dry needling;Taping    PT Next Visit Plan Assess response to HEP and modify accordingly. Progress hip and core strengthening. Initiate periscapular strengthening.  Discuss postural stabilization. Manual therapy/TPDN as needed for QL/paraspinals.             Patient will benefit from skilled therapeutic intervention in order to improve the following deficits and impairments:  Decreased range of motion, Increased fascial restricitons, Increased muscle spasms, Pain, Decreased activity tolerance, Improper body mechanics, Hypomobility, Decreased mobility, Decreased strength, Postural dysfunction  Visit Diagnosis: Muscle weakness (generalized)  Pain in thoracic spine  Abnormal posture     Problem List Patient Active Problem List   Diagnosis Date Noted   SVT (supraventricular tachycardia) ([place] 07/12/2020   Osteopenia 04/15/2020   Unilateral primary osteoarthritis, left knee 05/04/2019   Unilateral primary osteoarthritis, right knee 05/04/2019   CKD (chronic kidney disease) stage 2, GFR 60-89 ml/min 01/14/2017   Depression, major, in remission ([place] 11/27/2014   Overweight (BMI 25.0-29.9) 04/10/2014   Insomnia 04/10/2014   Vitamin D deficiency 01/07/2014   Abnormal glucose 01/03/2014   Medication management 01/03/2014   Anxiety    Morton's neuroma of left foot    Osteoarthritis    Hyperlipidemia, mixed 06/05/2009   Essential hypertension 06/05/2009    RScot Jun PTA 02/27/2021, 9:33 AM  [place] [address]Phone: [phone]  Fax:  [phone] Name: Kelsey SWARTZENTRUBERMRN: [id number] of Birth: [birth year]

## 2021-03-02 ENCOUNTER — Ambulatory Visit: Payer: Medicare Other | Admitting: Physical Therapy

## 2021-03-02 ENCOUNTER — Encounter: Payer: Self-pay | Admitting: Physical Therapy

## 2021-03-02 ENCOUNTER — Other Ambulatory Visit: Payer: Self-pay

## 2021-03-02 DIAGNOSIS — M545 Low back pain, unspecified: Secondary | ICD-10-CM | POA: Diagnosis not present

## 2021-03-02 DIAGNOSIS — G8929 Other chronic pain: Secondary | ICD-10-CM | POA: Diagnosis not present

## 2021-03-02 DIAGNOSIS — M546 Pain in thoracic spine: Secondary | ICD-10-CM

## 2021-03-02 DIAGNOSIS — M6281 Muscle weakness (generalized): Secondary | ICD-10-CM

## 2021-03-02 DIAGNOSIS — R293 Abnormal posture: Secondary | ICD-10-CM

## 2021-03-02 DIAGNOSIS — R2689 Other abnormalities of gait and mobility: Secondary | ICD-10-CM | POA: Diagnosis not present

## 2021-03-02 NOTE — Therapy (Signed)
[place]. [place], [place], [address] Phone: [phone]   Fax:  [phone]  Physical Therapy Treatment  Patient Details  Name: MAHLANI BERNINGER MRN: [id number] Date of Birth: 05/12/1940 Referring Provider (PT): Leandrew Koyanagi, MD   Encounter Date: 03/02/2021   PT End of Session - 03/02/21 1425     Visit Number 7    Date for PT Re-Evaluation 03/10/21    Authorization Type UHC Medicare    PT Start Time 1345    PT Stop Time 1430    PT Time Calculation (min) 45 min    Activity Tolerance Patient tolerated treatment well    Behavior During Therapy [facility] for tasks assessed/performed             Past Medical History:  Diagnosis Date   Anxiety    Dyslipidemia    on Rx x20 years   HTN (hypertension)    Hyperlipidemia    Insomnia    Morton's neuroma of left foot    Multiple allergies    Osteoarthritis    Polycythemia    PVC (premature ventricular contraction)     Past Surgical History:  Procedure Laterality Date   ABDOMINAL HYSTERECTOMY     CATARACT EXTRACTION Right 2019   Dr. Ellie Lunch    KNEE ARTHROSCOPY     R   TONSILLECTOMY     VESICOVAGINAL FISTULA CLOSURE W/ TAH      There were no vitals filed for this visit.   Subjective Assessment - 03/02/21 1346     Subjective Back is hurting some today, but not like it use to be. Pt stated pain started this morning.    Pain Score 5     Pain Location Back    Pain Orientation Mid                               [facility] Adult PT Treatment/Exercise - 03/02/21 0001       Lumbar Exercises: Aerobic   Nustep L4 x 6 min      Lumbar Exercises: Standing   Row Strengthening;Power tower;20 reps    Row Limitations 10    Shoulder Extension Strengthening;Power Tower;20 reps;Both   x3   Shoulder Extension Limitations 5      Lumbar Exercises: Seated   Other Seated Lumbar Exercises ER red 2x10    Other Seated Lumbar Exercises Horiz Abd red 2x10       Lumbar Exercises: Supine   Bridge Compliant;2 seconds;20 reps      Manual Therapy   Manual Therapy Soft tissue mobilization    Soft tissue mobilization Lumbar para spinales                          PT Long Term Goals - 02/27/21 0931       PT LONG TERM GOAL #2   Title Pt will report decrease in pain with activity by at least 50%    Status Partially Met      PT LONG TERM GOAL #3   Status On-going      PT LONG TERM GOAL #4   Title Pt will demo improved FOTO score to at least 60    Status On-going                   Plan - 03/02/21 1425     Clinical Impression Statement Pt  reported mid back pain upon entering clinic what had resolved after activity. Cue to prevent trunk flexion needed with standing shoulder extensions. Increase resistance tolerated with seated leg curls and extensions. Cues required to keep elbows to her side with external rotation.    Personal Factors and Comorbidities Age;Fitness;Time since onset of injury/illness/exacerbation    Examination-Activity Limitations Locomotion Level;Squat;Stand;Stairs    Examination-Participation Restrictions Meal Prep;Community Activity;Cleaning    PT Treatment/Interventions ADLs/Self Care Home Management;Aquatic Therapy;Electrical Stimulation;Iontophoresis 75m/ml Dexamethasone;Moist Heat;DME Instruction;Gait training;Stair training;Functional mobility training;Therapeutic activities;Therapeutic exercise;Balance training;Neuromuscular re-education;Manual techniques;Patient/family education;Passive range of motion;Dry needling;Taping    PT Next Visit Plan Assess response to HEP and modify accordingly. Progress hip and core strengthening. Initiate periscapular strengthening. Discuss postural stabilization. Manual therapy/TPDN as needed for QL/paraspinals.             Patient will benefit from skilled therapeutic intervention in order to improve the following deficits and impairments:  Decreased range of  motion, Increased fascial restricitons, Increased muscle spasms, Pain, Decreased activity tolerance, Improper body mechanics, Hypomobility, Decreased mobility, Decreased strength, Postural dysfunction  Visit Diagnosis: Muscle weakness (generalized)  Abnormal posture  Chronic bilateral low back pain, unspecified whether sciatica present  Pain in thoracic spine     Problem List Patient Active Problem List   Diagnosis Date Noted   SVT (supraventricular tachycardia) ([place] 07/12/2020   Osteopenia 04/15/2020   Unilateral primary osteoarthritis, left knee 05/04/2019   Unilateral primary osteoarthritis, right knee 05/04/2019   CKD (chronic kidney disease) stage 2, GFR 60-89 ml/min 01/14/2017   Depression, major, in remission ([place] 11/27/2014   Overweight (BMI 25.0-29.9) 04/10/2014   Insomnia 04/10/2014   Vitamin D deficiency 01/07/2014   Abnormal glucose 01/03/2014   Medication management 01/03/2014   Anxiety    Morton's neuroma of left foot    Osteoarthritis    Hyperlipidemia, mixed 06/05/2009   Essential hypertension 06/05/2009    RScot Jun PTA 03/02/2021, 2:28 PM  [place] [address]Phone: [phone]  Fax:  [phone] Name: AJASMINNE MEALYMRN: [id number] of Birth: 908/01/1941

## 2021-03-04 DIAGNOSIS — L814 Other melanin hyperpigmentation: Secondary | ICD-10-CM | POA: Diagnosis not present

## 2021-03-04 DIAGNOSIS — L821 Other seborrheic keratosis: Secondary | ICD-10-CM | POA: Diagnosis not present

## 2021-03-04 DIAGNOSIS — D225 Melanocytic nevi of trunk: Secondary | ICD-10-CM | POA: Diagnosis not present

## 2021-03-04 DIAGNOSIS — D485 Neoplasm of uncertain behavior of skin: Secondary | ICD-10-CM | POA: Diagnosis not present

## 2021-03-06 ENCOUNTER — Ambulatory Visit: Payer: Medicare Other | Admitting: Physical Therapy

## 2021-03-06 ENCOUNTER — Encounter: Payer: Self-pay | Admitting: Physical Therapy

## 2021-03-06 ENCOUNTER — Other Ambulatory Visit: Payer: Self-pay

## 2021-03-06 DIAGNOSIS — M546 Pain in thoracic spine: Secondary | ICD-10-CM | POA: Diagnosis not present

## 2021-03-06 DIAGNOSIS — R2689 Other abnormalities of gait and mobility: Secondary | ICD-10-CM | POA: Diagnosis not present

## 2021-03-06 DIAGNOSIS — G8929 Other chronic pain: Secondary | ICD-10-CM | POA: Diagnosis not present

## 2021-03-06 DIAGNOSIS — M545 Low back pain, unspecified: Secondary | ICD-10-CM

## 2021-03-06 DIAGNOSIS — R293 Abnormal posture: Secondary | ICD-10-CM | POA: Diagnosis not present

## 2021-03-06 DIAGNOSIS — M6281 Muscle weakness (generalized): Secondary | ICD-10-CM | POA: Diagnosis not present

## 2021-03-06 NOTE — Therapy (Signed)
[place]. [place], [place], [address] Phone: [phone]   Fax:  [phone]  Physical Therapy Treatment  Patient Details  Name: Kelsey Lopez MRN: [id number] Date of Birth: [birth year] Referring Provider (PT): Leandrew Koyanagi, MD   Encounter Date: 03/06/2021   PT End of Session - 03/06/21 1052     Visit Number 8    Date for PT Re-Evaluation 03/10/21    Authorization Type UHC Medicare    PT Start Time 1012    PT Stop Time 1100    PT Time Calculation (min) 48 min    Activity Tolerance Patient tolerated treatment well    Behavior During Therapy [facility] for tasks assessed/performed             Past Medical History:  Diagnosis Date   Anxiety    Dyslipidemia    on Rx x20 years   HTN (hypertension)    Hyperlipidemia    Insomnia    Morton's neuroma of left foot    Multiple allergies    Osteoarthritis    Polycythemia    PVC (premature ventricular contraction)     Past Surgical History:  Procedure Laterality Date   ABDOMINAL HYSTERECTOMY     CATARACT EXTRACTION Right 2019   Dr. Ellie Lunch    KNEE ARTHROSCOPY     R   TONSILLECTOMY     VESICOVAGINAL FISTULA CLOSURE W/ TAH      There were no vitals filed for this visit.   Subjective Assessment - 03/06/21 1014     Subjective That one little spot    Currently in Pain? Yes    Pain Score 3     Pain Location Back                [facility] PT Assessment - 03/06/21 0001       Strength   Right Hip Flexion 4/5    Right Hip ABduction 4+/5    Right Hip ADduction 5/5    Left Hip Flexion 4/5    Left Hip ABduction 4+/5    Left Hip ADduction 5/5                           [facility] Adult PT Treatment/Exercise - 03/06/21 0001       Lumbar Exercises: Aerobic   UBE (Upper Arm Bike) L1.3 x2 min each    Nustep L4 x 4 min      Lumbar Exercises: Machines for Strengthening   Cybex Knee Extension 5lb 2x10    Cybex Knee Flexion 20lb 2x10       Lumbar Exercises: Standing   Shoulder Extension Strengthening;Power Tower;20 reps;Both    Shoulder Extension Limitations 5      Lumbar Exercises: Seated   Sit to Stand 10 reps   x2, holding yellow ball   Other Seated Lumbar Exercises ER red 2x10    Other Seated Lumbar Exercises Horiz Abd red 2x10      Manual Therapy   Manual Therapy Soft tissue mobilization    Soft tissue mobilization Lumbar para spinales                          PT Long Term Goals - 03/06/21 1056       PT LONG TERM GOAL #4   Title Pt will demo improved FOTO score to at least 60    Status Achieved  Plan - 03/06/21 1056     Clinical Impression Statement Pt has progressed meeting all long term goals. She reports no functional limitations despite having one spot of pain. No issues completing today's interventions. Cue for core engagement needed with standing shoulder extensions.    Personal Factors and Comorbidities Age;Fitness;Time since onset of injury/illness/exacerbation    Examination-Activity Limitations Locomotion Level;Squat;Stand;Stairs    Examination-Participation Restrictions Meal Prep;Community Activity;Cleaning    Stability/Clinical Decision Making Evolving/Moderate complexity    Rehab Potential Good    PT Frequency 2x / week    PT Duration 6 weeks    PT Treatment/Interventions ADLs/Self Care Home Management;Aquatic Therapy;Electrical Stimulation;Iontophoresis 56m/ml Dexamethasone;Moist Heat;DME Instruction;Gait training;Stair training;Functional mobility training;Therapeutic activities;Therapeutic exercise;Balance training;Neuromuscular re-education;Manual techniques;Patient/family education;Passive range of motion;Dry needling;Taping    PT Next Visit Plan D/C PT             Patient will benefit from skilled therapeutic intervention in order to improve the following deficits and impairments:  Decreased range of motion, Increased fascial restricitons,  Increased muscle spasms, Pain, Decreased activity tolerance, Improper body mechanics, Hypomobility, Decreased mobility, Decreased strength, Postural dysfunction  Visit Diagnosis: Muscle weakness (generalized)  Other abnormalities of gait and mobility  Pain in thoracic spine  Chronic bilateral low back pain, unspecified whether sciatica present  Abnormal posture     Problem List Patient Active Problem List   Diagnosis Date Noted   SVT (supraventricular tachycardia) ([place] 07/12/2020   Osteopenia 04/15/2020   Unilateral primary osteoarthritis, left knee 05/04/2019   Unilateral primary osteoarthritis, right knee 05/04/2019   CKD (chronic kidney disease) stage 2, GFR 60-89 ml/min 01/14/2017   Depression, major, in remission ([place]ebast[place] 11/27/2014   Overweight (BMI 25.0-29.9) 04/10/2014   Insomnia 04/10/2014   Vitamin D deficiency 01/07/2014   Abnormal glucose 01/03/2014   Medication management 01/03/2014   Anxiety    Morton's neuroma of left foot    Osteoarthritis    Hyperlipidemia, mixed 06/05/2009   Essential hypertension 06/05/2009   PHYSICAL THERAPY DISCHARGE SUMMARY  Visits from Start of Care: 8   Patient agrees to discharge. Patient goals were met. Patient is being discharged due to meeting the stated rehab goals.   RScot Jun PTA 03/06/2021, 10:59 AM  [place] [address]Phone: [phone]  Fax:  [phone] Name: Kelsey ROUTEMRN: [id number] of Birth: [birth year]

## 2021-03-14 NOTE — Progress Notes (Signed)
Kelsey Lopez - 81 y.o. female MRN [id number]  Date of birth: 04/29/1940  Office Visit Note: Visit Date: 02/17/2021 PCP: Unk Pinto, MD Referred by: Unk Pinto, MD  Subjective: Chief Complaint  Patient presents with   Lower Back - Pain   HPI:  Kelsey Lopez is a 81 y.o. female who comes in today at the request of Dr. Eduard Roux for planned Left L4-5 Lumbar Interlaminar epidural steroid injection with fluoroscopic guidance.  The patient has failed conservative care including home exercise, medications, time and activity modification.  This injection will be diagnostic and hopefully therapeutic.  Please see requesting physician notes for further details and justification.  ROS Otherwise per HPI.  Assessment & Plan: Visit Diagnoses:    ICD-10-CM   1. Lumbar radiculopathy  M54.16 XR C-ARM NO REPORT    Epidural Steroid injection    methylPREDNISolone acetate (DEPO-MEDROL) injection 80 mg      Plan: No additional findings.   Meds & Orders:  Meds ordered this encounter  Medications   methylPREDNISolone acetate (DEPO-MEDROL) injection 80 mg    Orders Placed This Encounter  Procedures   XR C-ARM NO REPORT   Epidural Steroid injection    Follow-up: Return if symptoms worsen or fail to improve.   Procedures: No procedures performed  Lumbar Epidural Steroid Injection - Interlaminar Approach with Fluoroscopic Guidance  Patient: Kelsey Lopez      Date of Birth: [birth year] MRN: [id number] PCP: Unk Pinto, MD      Visit Date: 02/17/2021   Universal Protocol:     Consent Given By: the patient  Position: PRONE  Additional Comments: Vital signs were monitored before and after the procedure. Patient was prepped and draped in the usual sterile fashion. The correct patient, procedure, and site was verified.   Injection Procedure Details:   Procedure diagnoses: Lumbar radiculopathy [M54.16]   Meds Administered:  Meds ordered this encounter  Medications    methylPREDNISolone acetate (DEPO-MEDROL) injection 80 mg     Laterality: Left  Location/Site:  L4-5  Needle: 3.5 in., 20 ga. Tuohy  Needle Placement: Paramedian epidural  Findings:   -Comments: Excellent flow of contrast into the epidural space.  Procedure Details: Using a paramedian approach from the side mentioned above, the region overlying the inferior lamina was localized under fluoroscopic visualization and the soft tissues overlying this structure were infiltrated with 4 ml. of 1% Lidocaine without Epinephrine. The Tuohy needle was inserted into the epidural space using a paramedian approach.   The epidural space was localized using loss of resistance along with counter oblique bi-planar fluoroscopic views.  After negative aspirate for air, blood, and CSF, a 2 ml. volume of Isovue-250 was injected into the epidural space and the flow of contrast was observed. Radiographs were obtained for documentation purposes.    The injectate was administered into the level noted above.   Additional Comments:  The patient tolerated the procedure well Dressing: 2 x 2 sterile gauze and Band-Aid    Post-procedure details: Patient was observed during the procedure. Post-procedure instructions were reviewed.  Patient left the clinic in stable condition.   Clinical History: MRI LUMBAR SPINE WITHOUT CONTRAST   TECHNIQUE: Multiplanar, multisequence MR imaging of the lumbar spine was performed. No intravenous contrast was administered.   COMPARISON:  Lumbar radiographs 01/16/2021. [facility] CT Abdomen and Pelvis 05/08/2015.   FINDINGS: Segmentation:  Normal.   Alignment: Moderate dextroconvex lumbar scoliosis with apex at L3 has progressed since 2017. And subsequent straightening  of lumbar lordosis has increased since 2017 also. No significant anterior or posterior spondylolisthesis.   Vertebrae: Chronic endplate degeneration in the mid and upper lumbar spine  associated with scoliosis. No convincing marrow edema or acute osseous abnormality. Normal background bone marrow signal. Intact visible sacrum and SI joints.   Conus medullaris and cauda equina: Conus extends to the L1 level. No lower spinal cord or conus signal abnormality.   Paraspinal and other soft tissues: Negative.   Disc levels:   T11-T12: Minor right side disc bulge and facet hypertrophy. Mild right T11 foraminal stenosis.   T12-L1:  Negative.   L1-L2: Disc space loss with circumferential disc bulge and endplate spurring eccentric to the right. No spinal or lateral recess stenosis. Mild to moderate right L1 foraminal stenosis.   L2-L3: Severe disc space loss with circumferential disc osteophyte complex eccentric laterally to the left. Mild left side facet and ligament flavum hypertrophy. Mild to moderate left lateral recess stenosis (series 6, image 16 descending left L3 nerve level) without spinal stenosis. Moderate left L2 foraminal stenosis.   L3-L4: Disc space loss with circumferential disc osteophyte complex, bulky far laterally to the left. Moderate facet and ligament flavum hypertrophy primarily on the left. Mild to moderate left lateral recess stenosis (left L4 nerve level). Mild overall spinal stenosis. Moderate to severe left L3 foraminal stenosis.   L4-L5: Disc space loss with circumferential disc osteophyte complex eccentric to the right. Mild facet hypertrophy primarily on the right. Mild if any lateral recess stenosis (L5 nerve levels). No spinal stenosis. Mild bilateral L4 foraminal stenosis.   L5-S1: Mild disc bulging and endplate spurring eccentric to the right. Mild to moderate facet hypertrophy greater on the right. No spinal or lateral recess stenosis. Moderate right L5 foraminal stenosis.   IMPRESSION: 1. Moderate dextroconvex lumbar scoliosis has progressed since 2017 but there is no acute osseous abnormality.   2. Chronic lumbar disc and  endplate degeneration with intermittent posterior element hypertrophy. Subsequent mild spinal stenosis at L3-L4 with up to severe left foraminal and moderate left lateral recess stenosis (left L3 and L4 nerve levels, respectively).   Up to moderate left foraminal and left lateral recess stenosis at L2-L3. And moderate right foraminal stenosis at L5-S1.     Electronically Signed   By: Genevie Ann M.D.   On: 01/27/2021 10:33     Objective:  VS:  HT:     WT:    BMI:      BP:(!) 176/69   HR:68bpm   TEMP: ( )   RESP:  Physical Exam Vitals and nursing note reviewed.  Constitutional:      General: She is not in acute distress.    Appearance: Normal appearance. She is not ill-appearing.  HENT:     Head: Normocephalic and atraumatic.     Right Ear: External ear normal.     Left Ear: External ear normal.  Eyes:     Extraocular Movements: Extraocular movements intact.  Cardiovascular:     Rate and Rhythm: Normal rate.     Pulses: Normal pulses.  Pulmonary:     Effort: Pulmonary effort is normal. No respiratory distress.  Abdominal:     General: There is no distension.     Palpations: Abdomen is soft.  Musculoskeletal:        General: Tenderness present.     Cervical back: Neck supple.     Right lower leg: No edema.     Left lower leg: No edema.  Comments: Patient has good distal strength with no pain over the greater trochanters.  No clonus or focal weakness.  Skin:    Findings: No erythema, lesion or rash.  Neurological:     General: No focal deficit present.     Mental Status: She is alert and oriented to person, place, and time.     Sensory: No sensory deficit.     Motor: No weakness or abnormal muscle tone.     Coordination: Coordination normal.  Psychiatric:        Mood and Affect: Mood normal.        Behavior: Behavior normal.     Imaging: No results found.

## 2021-03-14 NOTE — Procedures (Signed)
Lumbar Epidural Steroid Injection - Interlaminar Approach with Fluoroscopic Guidance  Patient: Kelsey Lopez      Date of Birth: [birth year] MRN: [id number] PCP: Unk Pinto, MD      Visit Date: 02/17/2021   Universal Protocol:     Consent Given By: the patient  Position: PRONE  Additional Comments: Vital signs were monitored before and after the procedure. Patient was prepped and draped in the usual sterile fashion. The correct patient, procedure, and site was verified.   Injection Procedure Details:   Procedure diagnoses: Lumbar radiculopathy [M54.16]   Meds Administered:  Meds ordered this encounter  Medications   methylPREDNISolone acetate (DEPO-MEDROL) injection 80 mg     Laterality: Left  Location/Site:  L4-5  Needle: 3.5 in., 20 ga. Tuohy  Needle Placement: Paramedian epidural  Findings:   -Comments: Excellent flow of contrast into the epidural space.  Procedure Details: Using a paramedian approach from the side mentioned above, the region overlying the inferior lamina was localized under fluoroscopic visualization and the soft tissues overlying this structure were infiltrated with 4 ml. of 1% Lidocaine without Epinephrine. The Tuohy needle was inserted into the epidural space using a paramedian approach.   The epidural space was localized using loss of resistance along with counter oblique bi-planar fluoroscopic views.  After negative aspirate for air, blood, and CSF, a 2 ml. volume of Isovue-250 was injected into the epidural space and the flow of contrast was observed. Radiographs were obtained for documentation purposes.    The injectate was administered into the level noted above.   Additional Comments:  The patient tolerated the procedure well Dressing: 2 x 2 sterile gauze and Band-Aid    Post-procedure details: Patient was observed during the procedure. Post-procedure instructions were reviewed.  Patient left the clinic in stable condition.

## 2021-03-28 ENCOUNTER — Other Ambulatory Visit: Payer: Self-pay | Admitting: Orthopaedic Surgery

## 2021-03-28 ENCOUNTER — Other Ambulatory Visit: Payer: Self-pay | Admitting: Internal Medicine

## 2021-03-28 DIAGNOSIS — M159 Polyosteoarthritis, unspecified: Secondary | ICD-10-CM

## 2021-04-01 DIAGNOSIS — L988 Other specified disorders of the skin and subcutaneous tissue: Secondary | ICD-10-CM | POA: Diagnosis not present

## 2021-04-01 DIAGNOSIS — D485 Neoplasm of uncertain behavior of skin: Secondary | ICD-10-CM | POA: Diagnosis not present

## 2021-04-14 ENCOUNTER — Telehealth: Payer: Self-pay | Admitting: Orthopaedic Surgery

## 2021-04-14 NOTE — Telephone Encounter (Signed)
Kelsey Lopez is requesting a call back from Dr. Erlinda Hong about possible treatment plans for her degenerative scoliosis. She has been in pain for a long period of time now. ?

## 2021-04-14 NOTE — Telephone Encounter (Signed)
We have not seen her since 01/2021. Does she need another appt? Please advise. ? ?

## 2021-04-15 ENCOUNTER — Other Ambulatory Visit: Payer: Self-pay

## 2021-04-15 DIAGNOSIS — G8929 Other chronic pain: Secondary | ICD-10-CM

## 2021-04-15 NOTE — Telephone Encounter (Signed)
Order and referral made. ? ?

## 2021-04-16 ENCOUNTER — Ambulatory Visit: Payer: Medicare Other | Admitting: Adult Health

## 2021-04-27 ENCOUNTER — Other Ambulatory Visit: Payer: Self-pay | Admitting: Orthopaedic Surgery

## 2021-04-27 DIAGNOSIS — G8929 Other chronic pain: Secondary | ICD-10-CM | POA: Diagnosis not present

## 2021-04-27 DIAGNOSIS — M4150 Other secondary scoliosis, site unspecified: Secondary | ICD-10-CM | POA: Diagnosis not present

## 2021-04-27 DIAGNOSIS — M545 Low back pain, unspecified: Secondary | ICD-10-CM | POA: Diagnosis not present

## 2021-05-07 ENCOUNTER — Ambulatory Visit: Payer: Medicare Other | Admitting: Physical Medicine and Rehabilitation

## 2021-05-07 ENCOUNTER — Ambulatory Visit: Payer: Self-pay

## 2021-05-07 ENCOUNTER — Encounter: Payer: Self-pay | Admitting: Physical Medicine and Rehabilitation

## 2021-05-07 VITALS — BP 159/66 | HR 64

## 2021-05-07 DIAGNOSIS — M419 Scoliosis, unspecified: Secondary | ICD-10-CM

## 2021-05-07 DIAGNOSIS — M5416 Radiculopathy, lumbar region: Secondary | ICD-10-CM | POA: Diagnosis not present

## 2021-05-07 MED ORDER — METHYLPREDNISOLONE ACETATE 80 MG/ML IJ SUSP
80.0000 mg | Freq: Once | INTRAMUSCULAR | Status: AC
  Administered 2021-05-07: 80 mg

## 2021-05-07 NOTE — Procedures (Signed)
Lumbar Epidural Steroid Injection - Interlaminar Approach with Fluoroscopic Guidance ? ?Patient: Kelsey Lopez      ?Date of Birth: 1940-10-23 ?MRN: [id number] ?PCP: Unk Pinto, MD      ?Visit Date: 05/07/2021 ?  ?Universal Protocol:    ? ?Consent Given By: the patient ? ?Position: PRONE ? ?Additional Comments: ?Vital signs were monitored before and after the procedure. ?Patient was prepped and draped in the usual sterile fashion. ?The correct patient, procedure, and site was verified. ? ? ?Injection Procedure Details:  ? ?Procedure diagnoses: Lumbar radiculopathy [M54.16]  ? ?Meds Administered:  ?Meds ordered this encounter  ?Medications  ? methylPREDNISolone acetate (DEPO-MEDROL) injection 80 mg  ?  ? ?Laterality: Left ? ?Location/Site:  L4-5 ? ?Needle: 3.5 in., 20 ga. Tuohy ? ?Needle Placement: Paramedian epidural ? ?Findings:  ? -Comments: Excellent flow of contrast into the epidural space. ? ?Procedure Details: ?Using a paramedian approach from the side mentioned above, the region overlying the inferior lamina was localized under fluoroscopic visualization and the soft tissues overlying this structure were infiltrated with 4 ml. of 1% Lidocaine without Epinephrine. The Tuohy needle was inserted into the epidural space using a paramedian approach.  ? ?The epidural space was localized using loss of resistance along with counter oblique bi-planar fluoroscopic views.  After negative aspirate for air, blood, and CSF, a 2 ml. volume of Isovue-250 was injected into the epidural space and the flow of contrast was observed. Radiographs were obtained for documentation purposes.   ? ?The injectate was administered into the level noted above. ? ? ?Additional Comments:  ?The patient tolerated the procedure well ?Dressing: 2 x 2 sterile gauze and Band-Aid ?  ? ?Post-procedure details: ?Patient was observed during the procedure. ?Post-procedure instructions were reviewed. ? ?Patient left the clinic in stable condition. ?

## 2021-05-07 NOTE — Progress Notes (Signed)
Pt state lower back pain. Pt state any movement makes the pain worse. Pt state she takes pain meds to help ease her pain. ? ?Numeric Pain Rating Scale and Functional Assessment ?Average Pain 3 ? ? ?In the last MONTH (on 0-10 scale) has pain interfered with the following? ? ?1. General activity like being  able to carry out your everyday physical activities such as walking, climbing stairs, carrying groceries, or moving a chair?  ?Rating(8) ? ? ?+Driver, -BT, -Dye Allergies. ? ?

## 2021-05-07 NOTE — Patient Instructions (Signed)

## 2021-05-07 NOTE — Progress Notes (Signed)
? ?Kelsey Lopez - 81 y.o. female MRN [id number]  Date of birth: Aug 13, 1940 ? ?Office Visit Note: ?Visit Date: 05/07/2021 ?PCP: Unk Pinto, MD ?Referred by: Unk Pinto, MD ? ?Subjective: ?Chief Complaint  ?Patient presents with  ? Lower Back - Pain  ? ?HPI:  Kelsey Lopez is a 81 y.o. female who comes in today for planned repeat Left L4-5  Lumbar Interlaminar epidural steroid injection with fluoroscopic guidance.  The patient has failed conservative care including home exercise, medications, time and activity modification.  This injection will be diagnostic and hopefully therapeutic.  Please see requesting physician notes for further details and justification. Patient received more than 50% pain relief from prior injection.  Diagnostically she did get quite a bit of relief but only lasted about a month.  Since I have seen her she is seeing Dr. Kathyrn Sheriff who felt like she was not a great surgical candidate to the level of correction given the lumbar spine with scoliosis.  Patient will call us back in a few weeks depending on results of today's injection.  Would consider transforaminal approach at the L2 level where the scoliosis resides.  Consider facet joint block and ultimately could be a stimulator candidate.  She continues with physical therapy. ? ?Referring: Dr. Eduard Roux ? ?ROS Otherwise per HPI. ? ?Assessment & Plan: ?Visit Diagnoses:  ?  ICD-10-CM   ?1. Lumbar radiculopathy  M54.16 XR C-ARM NO REPORT  ?  Epidural Steroid injection  ?  methylPREDNISolone acetate (DEPO-MEDROL) injection 80 mg  ?  ?2. Scoliosis of thoracolumbar spine, unspecified scoliosis type  M41.9   ?  ?  ?Plan: No additional findings.  ? ?Meds & Orders:  ?Meds ordered this encounter  ?Medications  ? methylPREDNISolone acetate (DEPO-MEDROL) injection 80 mg  ?  ?Orders Placed This Encounter  ?Procedures  ? XR C-ARM NO REPORT  ? Epidural Steroid injection  ?  ?Follow-up: Return if symptoms worsen or fail to improve.   ? ?Procedures: ?No procedures performed  ?Lumbar Epidural Steroid Injection - Interlaminar Approach with Fluoroscopic Guidance ? ?Patient: Kelsey Lopez      ?Date of Birth: 1940-12-29 ?MRN: [id number] ?PCP: Unk Pinto, MD      ?Visit Date: 05/07/2021 ?  ?Universal Protocol:    ? ?Consent Given By: the patient ? ?Position: PRONE ? ?Additional Comments: ?Vital signs were monitored before and after the procedure. ?Patient was prepped and draped in the usual sterile fashion. ?The correct patient, procedure, and site was verified. ? ? ?Injection Procedure Details:  ? ?Procedure diagnoses: Lumbar radiculopathy [M54.16]  ? ?Meds Administered:  ?Meds ordered this encounter  ?Medications  ? methylPREDNISolone acetate (DEPO-MEDROL) injection 80 mg  ?  ? ?Laterality: Left ? ?Location/Site:  L4-5 ? ?Needle: 3.5 in., 20 ga. Tuohy ? ?Needle Placement: Paramedian epidural ? ?Findings:  ? -Comments: Excellent flow of contrast into the epidural space. ? ?Procedure Details: ?Using a paramedian approach from the side mentioned above, the region overlying the inferior lamina was localized under fluoroscopic visualization and the soft tissues overlying this structure were infiltrated with 4 ml. of 1% Lidocaine without Epinephrine. The Tuohy needle was inserted into the epidural space using a paramedian approach.  ? ?The epidural space was localized using loss of resistance along with counter oblique bi-planar fluoroscopic views.  After negative aspirate for air, blood, and CSF, a 2 ml. volume of Isovue-250 was injected into the epidural space and the flow of contrast was observed. Radiographs were obtained for documentation purposes.   ? ?  The injectate was administered into the level noted above. ? ? ?Additional Comments:  ?The patient tolerated the procedure well ?Dressing: 2 x 2 sterile gauze and Band-Aid ?  ? ?Post-procedure details: ?Patient was observed during the procedure. ?Post-procedure instructions were  reviewed. ? ?Patient left the clinic in stable condition.  ? ?Clinical History: ?No specialty comments available.  ? ? ? ?Objective:  VS:  HT:    WT:   BMI:     BP:(!) 159/66  HR:64bpm  TEMP: ( )  RESP:  ?Physical Exam ?Vitals and nursing note reviewed.  ?Constitutional:   ?   General: She is not in acute distress. ?   Appearance: Normal appearance. She is not ill-appearing.  ?HENT:  ?   Head: Normocephalic and atraumatic.  ?   Right Ear: External ear normal.  ?   Left Ear: External ear normal.  ?Eyes:  ?   Extraocular Movements: Extraocular movements intact.  ?Cardiovascular:  ?   Rate and Rhythm: Normal rate.  ?   Pulses: Normal pulses.  ?Pulmonary:  ?   Effort: Pulmonary effort is normal. No respiratory distress.  ?Abdominal:  ?   General: There is no distension.  ?   Palpations: Abdomen is soft.  ?Musculoskeletal:     ?   General: Tenderness present.  ?   Cervical back: Neck supple.  ?   Right lower leg: No edema.  ?   Left lower leg: No edema.  ?   Comments: Patient has good distal strength with no pain over the greater trochanters.  No clonus or focal weakness.  ?Skin: ?   Findings: No erythema, lesion or rash.  ?Neurological:  ?   General: No focal deficit present.  ?   Mental Status: She is alert and oriented to person, place, and time.  ?   Sensory: No sensory deficit.  ?   Motor: No weakness or abnormal muscle tone.  ?   Coordination: Coordination normal.  ?Psychiatric:     ?   Mood and Affect: Mood normal.     ?   Behavior: Behavior normal.  ?  ? ?Imaging: ?XR C-ARM NO REPORT ? ?Result Date: 05/07/2021 ?Please see Notes tab for imaging impression.  ?

## 2021-05-09 NOTE — Progress Notes (Signed)
Patient ID: Kelsey Lopez, female   DOB: 01-23-1941, 81 y.o.   MRN: [id number] ?MEDICARE ANNUAL WELLNESS VISIT AND 3 month ? ?Assessment:  ?  ?Encounter for Annual Physical Exam with abnormal findings ?Due annually  ?Health Maintenance reviewed ?Healthy lifestyle reviewed and goals set ?- mammogram order placed to schedule at [facility] ?- bring advanced directives/living will paperwork for chart ? ?Essential hypertension ?- persistently above goal, increase losartan to 100 mg, HCTZ to 25 mg (previously well controlled on this dose) ?- DASH diet, exercise and monitor at home. Call if persistently greater than 140/80 in 1-2 weeks, consider olmesartan ?- follow up in 4-6 weeks ?- CMP/GFR ?- TSH ? ? Other abnormal glucose ?Recent A1Cs at goal ?Discussed diet/exercise, weight management  ?Defer A1C; check CMP ? ?Mixed hyperlipidemia ?-continue medications, check lipids, decrease fatty foods, increase activity.  ?- Lipid panel ? ? Depression/ Anxiety ?Well managed by current regimen;  ?Stress management techniques discussed, increase water, good sleep hygiene discussed, increase exercise, and increase veggies.  ? ?Medication management ?- Magnesium ? ? Vitamin D deficiency ?Continue supplement for goal 60-100 ? ?Osteoarthritis, unspecified osteoarthritis type, unspecified site ?RICE, NSAIDS, followed by Dr. Erlinda Hong ? ?OVERWEIGHT ?- long discussion about weight loss, diet, and exercise ? ? Insomnia ?Insomnia- good sleep hygiene discussed, increase day time activity ?- continue trazodone ? ?Stage 2 CKD ?Increase fluids, avoid NSAIDS, monitor sugars, will monitor ? ?Osteopenia  ?- get dexa 12/2021, continue Vit D and Ca, encouraged weight bearing exercises ? ?High risk medication use ?Has benzo script from our office, rare tramadol script use (from ortho) ?Discussed risks of unintentional overdose, risk of high risk meds in elders ?Goal to taper off opioid (pain currently well controlled with injection, only needs with breakthrough in  between) ?benzo - minimize use/ DO NOT COMBINE WITH OPIOID OR ALCOHOL;  ?She is able to verbalize back appropriate understanding ?monitor PDMP closely  ? ?SVT ([place]) ?No known recurrence since last episode 07/2020 that resolved with hydration ?Monitor; avoid dehydration or stimulants ?Declines rate controlling agent at this point ?Go to the ER if any chest pain, shortness of breath, nausea, dizziness, severe HA, changes vision/speech ? ? ?Future Appointments  ?Date Time Provider [place]  ?05/13/2021  3:45 PM Marcelina Morel, PT [facility]  ?01/19/2022  2:00 PM Unk Pinto, MD [facility] None  ?05/13/2022  4:00 PM Liane Comber, NP [facility] None  ? ? ?  ?Plan:  ? ?During the course of the visit the patient was educated and counseled about appropriate screening and preventive services including:  ? ?Pneumococcal vaccine  ?Influenza vaccine ?Td vaccine ?Screening electrocardiogram ?Screening mammography ?Bone densitometry screening ?Colorectal cancer screening ?Diabetes screening ?Glaucoma screening ?Nutrition counseling  ?Advanced directives: given information/requested ? ? ?Subjective:  ? ?Kelsey Lopez is a 81 y.o. female who presents for Medicare Annual Wellness Visit and 3 month follow up. She has Hyperlipidemia, mixed; Essential hypertension; Anxiety; Osteoarthritis; Abnormal glucose; Medication management; Vitamin D deficiency; Overweight (BMI 25.0-29.9); Insomnia; Depression, major, in remission ([place]); CKD (chronic kidney disease) stage 2, GFR 60-89 ml/min; Unilateral primary osteoarthritis, left knee; Unilateral primary osteoarthritis, right knee; Osteopenia; and SVT (supraventricular tachycardia) ([place]) on their problem list. ? ?She has hx of second hand smoke exposure, prone to bronchitis, normal CXRs, no problems since on Singulair, zyrtec and mucinex.  ? ?She is on celexa for anxiety, started after her husband passed and continues to perceive benefit, she is on xanax PRN (takes 1/2 tab 3-4  days a week  if very stressed) and she is on trazodone 150 mg daily at night. Very involved in church.  ? ?Following with Dr. Erlinda Hong for knee pain, bone on bone, also degenerative scoliosis, did see neurosurgery but doesn't want surgery, PT helped and doing well with occasional injections, just had last week and currently pain is very well controlled. Manges with heat, rest, Meloxicam, occasional PRN via ortho.  ? ?BMI is Body mass index is 26.96 kg/m?., she has been working on diet, exercise limited by bil knee arthritis but has been working with PT, starting another round tomorrow. When able can walk 1-1.5 miles around her neighborhood.  ?Wt Readings from Last 3 Encounters:  ?05/12/21 162 lb (73.5 kg)  ?01/12/21 163 lb 8 oz (74.2 kg)  ?07/11/20 166 lb (75.3 kg)  ? ?Hx of remote SVT, had another episode 07/11/2020 noted on EKG in office, resolved en route to ED via EMS with NS bolus. Denies recurrence since.  ? ?She admits hasn't been checking BPs at home, does have cuff, BP was also elevated at last 2 visits, previously well controlled, today their BP is BP: (!) 160/76. Currently taking losartan ? 25 mg per patient, script states 50 mg, also HCTZ 12.5. Was previously prescribed 100 mg/25 mg but was reduced at some point. She notes was having more pain/stress until this week.  ?She does not workout. She denies chest pain, shortness of breath, dizziness.  ? ?She is on cholesterol medication (simvastatin 40 mg daily) and denies myalgias. Her cholesterol is at goal. The cholesterol last visit was:   ?Lab Results  ?Component Value Date  ? CHOL 152 01/12/2021  ? HDL 68 01/12/2021  ? [place] 67 01/12/2021  ? TRIG 83 01/12/2021  ? CHOLHDL 2.2 01/12/2021  ? ?Last A1C in the office was:  ?Lab Results  ?Component Value Date  ? HGBA1C 5.3 01/12/2021  ? ? She has CKD II monitored at this office; last GFR:  ?Lab Results  ?Component Value Date  ? EGFR 73 01/12/2021  ? ?Patient is on Vitamin D supplement, 2000 IU.   ?Lab Results   ?Component Value Date  ? VD25OH 70 01/12/2021  ?   ? ? ? ? ?Medication Review ? ? ?Current Outpatient Medications (Cardiovascular):  ?  hydrochlorothiazide (HYDRODIURIL) 12.5 MG tablet, TAKE 1 TABLET DAILY FOR BLOOD PRESSURE & FLUID RETENTION / ANKLE SWELLING ?  losartan (COZAAR) 50 MG tablet, TAKE 1 TABLET BY MOUTH EVERY DAY FOR BLOOD PRESSURE ?  simvastatin (ZOCOR) 40 MG tablet, Take  1 tablet  at Bedtime  for Cholesterol / Patient knows to take by mouth ? ?Current Outpatient Medications (Respiratory):  ?  cetirizine (ZYRTEC) 10 MG tablet, Take 10 mg by mouth as needed. ?  montelukast (SINGULAIR) 10 MG tablet, Take  1 tablet  Daily for Allergies ? ?Current Outpatient Medications (Analgesics):  ?  aspirin 81 MG EC tablet, Take 81 mg by mouth daily. ?  meloxicam (MOBIC) 15 MG tablet, TAKE 1/2 TO 1 TABLET DAILY WITH FOOD FOR PAIN & INFLAMMATION ?  traMADol (ULTRAM) 50 MG tablet, TAKE 1-2 TABLETS BY MOUTH DAILY AS NEEDED. ? ? ?Current Outpatient Medications (Other):  ?  ALPRAZolam (XANAX) 0.5 MG tablet, TAKE 1/2 -1 TABLET AT BEDTIME ONLY IF NEEDED FOR SLEEP & LIMIT TO 5 DAYS /WEEK TO AVOID ADDICTION & DEMENTIA ?  Cholecalciferol (VITAMIN D3) 2000 UNITS capsule, Take 2,000 Units by mouth daily. ?  citalopram (CELEXA) 20 MG tablet, TAKE 1 TABLET BY MOUTH DAILY FOR MOOD ?  conjugated estrogens (PREMARIN) vaginal cream, Place 1 Applicatorful vaginally. Use once every week ?  Flaxseed, Linseed, (FLAX SEED OIL) 1000 MG CAPS, Take 1,000 mg by mouth daily. ?  Magnesium 400 MG CAPS, Take 400 mg by mouth daily. ?  Multiple Vitamin (MULTIVITAMIN) tablet, Take 1 tablet by mouth daily. ?  NON FORMULARY, Calcium 1800 mg 1 tablet daily ?  OVER THE COUNTER MEDICATION, Otc Mucinex 1 tab daily ?  tiZANidine (ZANAFLEX) 4 MG tablet, TAKE 1 TABLET (4 MG TOTAL) BY MOUTH EVERY 8 (EIGHT) HOURS AS NEEDED FOR MUSCLE SPASMS ?  traZODone (DESYREL) 150 MG tablet, TAKE 1/2 TO 1 TABLET 1 HOUR BEFORE BEDTIME IF NEEDED FOR SLEEP ?  vitamin C  (ASCORBIC ACID) 500 MG tablet, Take 500 mg by mouth daily. ? ?Current Problems (verified) ?Patient Active Problem List  ? Diagnosis Date Noted  ? SVT (supraventricular tachycardia) ([place]) 07/12/2020  ? Osteopenia

## 2021-05-12 ENCOUNTER — Encounter: Payer: Self-pay | Admitting: Adult Health

## 2021-05-12 ENCOUNTER — Ambulatory Visit (INDEPENDENT_AMBULATORY_CARE_PROVIDER_SITE_OTHER): Payer: Medicare Other | Admitting: Adult Health

## 2021-05-12 VITALS — BP 156/78 | HR 64 | Temp 97.9°F | Wt 162.0 lb

## 2021-05-12 DIAGNOSIS — E782 Mixed hyperlipidemia: Secondary | ICD-10-CM | POA: Diagnosis not present

## 2021-05-12 DIAGNOSIS — F419 Anxiety disorder, unspecified: Secondary | ICD-10-CM

## 2021-05-12 DIAGNOSIS — E559 Vitamin D deficiency, unspecified: Secondary | ICD-10-CM

## 2021-05-12 DIAGNOSIS — N182 Chronic kidney disease, stage 2 (mild): Secondary | ICD-10-CM | POA: Diagnosis not present

## 2021-05-12 DIAGNOSIS — G4700 Insomnia, unspecified: Secondary | ICD-10-CM | POA: Diagnosis not present

## 2021-05-12 DIAGNOSIS — Z0000 Encounter for general adult medical examination without abnormal findings: Secondary | ICD-10-CM

## 2021-05-12 DIAGNOSIS — R6889 Other general symptoms and signs: Secondary | ICD-10-CM

## 2021-05-12 DIAGNOSIS — Z1231 Encounter for screening mammogram for malignant neoplasm of breast: Secondary | ICD-10-CM

## 2021-05-12 DIAGNOSIS — E663 Overweight: Secondary | ICD-10-CM

## 2021-05-12 DIAGNOSIS — R7309 Other abnormal glucose: Secondary | ICD-10-CM

## 2021-05-12 DIAGNOSIS — M8589 Other specified disorders of bone density and structure, multiple sites: Secondary | ICD-10-CM

## 2021-05-12 DIAGNOSIS — I10 Essential (primary) hypertension: Secondary | ICD-10-CM

## 2021-05-12 DIAGNOSIS — Z0001 Encounter for general adult medical examination with abnormal findings: Secondary | ICD-10-CM | POA: Diagnosis not present

## 2021-05-12 DIAGNOSIS — M1990 Unspecified osteoarthritis, unspecified site: Secondary | ICD-10-CM

## 2021-05-12 DIAGNOSIS — I471 Supraventricular tachycardia: Secondary | ICD-10-CM

## 2021-05-12 DIAGNOSIS — Z79899 Other long term (current) drug therapy: Secondary | ICD-10-CM | POA: Diagnosis not present

## 2021-05-12 DIAGNOSIS — F325 Major depressive disorder, single episode, in full remission: Secondary | ICD-10-CM

## 2021-05-12 MED ORDER — LOSARTAN POTASSIUM-HCTZ 100-25 MG PO TABS: 1.0000 | ORAL_TABLET | Freq: Every day | ORAL | 3 refills | Status: DC

## 2021-05-12 NOTE — Patient Instructions (Addendum)
? ?Please bring advanced directive and living will paperwork ? ?Increase losartan to 100 mg (25 mg x 4 or 50 mg x 2) for 1 week ?Then HCTZ from 12.5 mg to 25 mg - daily in the morning ? ?Blood pressure goal <140/80 ?Ideally <130/80 (if no dizziness/fatigue) ? ? ?HOW TO SCHEDULE A MAMMOGRAM ? ?[facility] ?Schedule an appointment by calling [phone]. ? ? ? ? ?HYPERTENSION INFORMATION ? ?Monitor your blood pressure at home, please keep a record and bring that in with you to your next office visit.  ? ?Go to the ER if any CP, SOB, nausea, dizziness, severe HA, changes vision/speech ? ?Testing/Procedures: ?HOW TO TAKE YOUR BLOOD PRESSURE: ?Rest 5 minutes before taking your blood pressure. ?Don?t smoke or drink caffeinated beverages for at least 30 minutes before. ?Take your blood pressure before (not after) you eat. ?Sit comfortably with your back supported and both feet on the floor (don?t cross your legs). ?Elevate your arm to heart level on a table or a desk. ?Use the proper sized cuff. It should fit smoothly and snugly around your bare upper arm. There should be enough room to slip a fingertip under the cuff. The bottom edge of the cuff should be 1 inch above the crease of the elbow. ? ?Your most recent BP: BP: (!) 156/78  ? ?Take your medications faithfully as instructed. ?Maintain a healthy weight. ?Get at least 150 minutes of aerobic exercise per week. ?Minimize salt intake. ?Minimize alcohol intake ? ?DASH Eating Plan ?DASH stands for "Dietary Approaches to Stop Hypertension." The DASH eating plan is a healthy eating plan that has been shown to reduce high blood pressure (hypertension). Additional health benefits may include reducing the risk of type 2 diabetes mellitus, heart disease, and stroke. The DASH eating plan may also help with weight loss. ?WHAT DO I NEED TO KNOW ABOUT THE DASH EATING PLAN? ?For the DASH eating plan, you will follow these general guidelines: ?Choose foods with a percent  daily value for sodium of less than 5% (as listed on the food label). ?Use salt-free seasonings or herbs instead of table salt or sea salt. ?Check with your health care provider or pharmacist before using salt substitutes. ?Eat lower-sodium products, often labeled as "lower sodium" or "no salt added." ?Eat fresh foods. ?Eat more vegetables, fruits, and low-fat dairy products. ?Choose whole grains. Look for the word "whole" as the first word in the ingredient list. ?Choose fish and skinless chicken or [place] more often than red meat. Limit fish, poultry, and meat to 6 oz (170 g) each day. ?Limit sweets, desserts, sugars, and sugary drinks. ?Choose heart-healthy fats. ?Limit cheese to 1 oz (28 g) per day. ?Eat more home-cooked food and less restaurant, buffet, and fast food. ?Limit fried foods. ?Cook foods using methods other than frying. ?Limit canned vegetables. If you do use them, rinse them well to decrease the sodium. ?When eating at a restaurant, ask that your food be prepared with less salt, or no salt if possible. ?WHAT FOODS CAN I EAT? ?Seek help from a dietitian for individual calorie needs. ?Grains ?Whole grain or whole wheat bread. Brown rice. Whole grain or whole wheat pasta. Quinoa, bulgur, and whole grain cereals. Low-sodium cereals. Corn or whole wheat flour tortillas. Whole grain cornbread. Whole grain crackers. Low-sodium crackers. ?Vegetables ?Fresh or frozen vegetables (raw, steamed, roasted, or grilled). Low-sodium or reduced-sodium tomato and vegetable juices. Low-sodium or reduced-sodium tomato sauce and paste. Low-sodium or reduced-sodium canned vegetables.  ?Fruits ?All fresh,  canned (in natural juice), or frozen fruits. ?Meat and Other Protein Products ?Ground beef (85% or leaner), grass-fed beef, or beef trimmed of fat. Skinless chicken or [place]. Ground chicken or [place]. Pork trimmed of fat. All fish and seafood. Eggs. Dried beans, peas, or lentils. Unsalted nuts and seeds. Unsalted canned  beans. ?Dairy ?Low-fat dairy products, such as skim or 1% milk, 2% or reduced-fat cheeses, low-fat ricotta or cottage cheese, or plain low-fat yogurt. Low-sodium or reduced-sodium cheeses. ?Fats and Oils ?Tub margarines without trans fats. Light or reduced-fat mayonnaise and salad dressings (reduced sodium). Avocado. Safflower, olive, or canola oils. Natural peanut or almond butter. ?Other ?Unsalted popcorn and pretzels. ?The items listed above may not be a complete list of recommended foods or beverages. Contact your dietitian for more options. ?WHAT FOODS ARE NOT RECOMMENDED? ?Grains ?White bread. White pasta. White rice. Refined cornbread. Bagels and croissants. Crackers that contain trans fat. ?Vegetables ?Creamed or fried vegetables. Vegetables in a cheese sauce. Regular canned vegetables. Regular canned tomato sauce and paste. Regular tomato and vegetable juices. ?Fruits ?Dried fruits. Canned fruit in light or heavy syrup. Fruit juice. ?Meat and Other Protein Products ?Fatty cuts of meat. Ribs, chicken wings, bacon, sausage, bologna, salami, chitterlings, fatback, hot dogs, bratwurst, and packaged luncheon meats. Salted nuts and seeds. Canned beans with salt. ?Dairy ?Whole or 2% milk, cream, half-and-half, and cream cheese. Whole-fat or sweetened yogurt. Full-fat cheeses or blue cheese. Nondairy creamers and whipped toppings. Processed cheese, cheese spreads, or cheese curds. ?Condiments ?Onion and garlic salt, seasoned salt, table salt, and sea salt. Canned and packaged gravies. Worcestershire sauce. Tartar sauce. Barbecue sauce. Teriyaki sauce. Soy sauce, including reduced sodium. Steak sauce. Fish sauce. Oyster sauce. Cocktail sauce. Horseradish. Ketchup and mustard. Meat flavorings and tenderizers. Bouillon cubes. Hot sauce. Tabasco sauce. Marinades. Taco seasonings. Relishes. ?Fats and Oils ?Butter, stick margarine, lard, shortening, ghee, and bacon fat. Coconut, palm kernel, or palm oils. Regular salad  dressings. ?Other ?Pickles and olives. Salted popcorn and pretzels. ?The items listed above may not be a complete list of foods and beverages to avoid. Contact your dietitian for more information. ?WHERE CAN I FIND MORE INFORMATION? ?National Heart, Lung, and Blood Institute: travelstabloid.com ?Document Released: 01/14/2011 Document Revised: 06/11/2013 Document Reviewed: 11/29/2012 ?ExitCare? Patient Information ?2015 [employer]. This information is not intended to replace advice given to you by your health care provider. Make sure you discuss any questions you have with your health care provider. ? ? ? ?

## 2021-05-13 ENCOUNTER — Encounter: Payer: Self-pay | Admitting: Physical Therapy

## 2021-05-13 ENCOUNTER — Ambulatory Visit: Payer: Medicare Other | Attending: Neurosurgery | Admitting: Physical Therapy

## 2021-05-13 ENCOUNTER — Encounter: Payer: Self-pay | Admitting: Adult Health

## 2021-05-13 DIAGNOSIS — R2689 Other abnormalities of gait and mobility: Secondary | ICD-10-CM | POA: Diagnosis not present

## 2021-05-13 DIAGNOSIS — M546 Pain in thoracic spine: Secondary | ICD-10-CM | POA: Insufficient documentation

## 2021-05-13 DIAGNOSIS — R7989 Other specified abnormal findings of blood chemistry: Secondary | ICD-10-CM | POA: Insufficient documentation

## 2021-05-13 DIAGNOSIS — R293 Abnormal posture: Secondary | ICD-10-CM | POA: Insufficient documentation

## 2021-05-13 DIAGNOSIS — G8929 Other chronic pain: Secondary | ICD-10-CM | POA: Diagnosis not present

## 2021-05-13 DIAGNOSIS — M545 Low back pain, unspecified: Secondary | ICD-10-CM | POA: Diagnosis not present

## 2021-05-13 DIAGNOSIS — M6281 Muscle weakness (generalized): Secondary | ICD-10-CM | POA: Diagnosis not present

## 2021-05-13 LAB — COMPLETE METABOLIC PANEL WITH GFR
AG Ratio: 1.9 (calc) (ref 1.0–2.5)
ALT: 39 U/L — ABNORMAL HIGH (ref 6–29)
AST: 44 U/L — ABNORMAL HIGH (ref 10–35)
Albumin: 4.2 g/dL (ref 3.6–5.1)
Alkaline phosphatase (APISO): 80 U/L (ref 37–153)
BUN: 17 mg/dL (ref 7–25)
CO2: 28 mmol/L (ref 20–32)
Calcium: 10.2 mg/dL (ref 8.6–10.4)
Chloride: 99 mmol/L (ref 98–110)
Creat: 0.73 mg/dL (ref 0.60–0.95)
Globulin: 2.2 g/dL (calc) (ref 1.9–3.7)
Glucose, Bld: 94 mg/dL (ref 65–99)
Potassium: 4.1 mmol/L (ref 3.5–5.3)
Sodium: 136 mmol/L (ref 135–146)
Total Bilirubin: 0.7 mg/dL (ref 0.2–1.2)
Total Protein: 6.4 g/dL (ref 6.1–8.1)
eGFR: 83 mL/min/{1.73_m2} (ref >=60)

## 2021-05-13 LAB — CBC WITH DIFFERENTIAL/PLATELET
Absolute Monocytes: 663 cells/uL (ref 200–950)
Basophils Absolute: 47 cells/uL (ref 0–200)
Basophils Relative: 0.6 %
Eosinophils Absolute: 164 cells/uL (ref 15–500)
Eosinophils Relative: 2.1 %
HCT: 40.1 % (ref 35.0–45.0)
Hemoglobin: 13.5 g/dL (ref 11.7–15.5)
Lymphs Abs: 1279 cells/uL (ref 850–3900)
MCH: 32.8 pg (ref 27.0–33.0)
MCHC: 33.7 g/dL (ref 32.0–36.0)
MCV: 97.6 fL (ref 80.0–100.0)
MPV: 10.1 fL (ref 7.5–12.5)
Monocytes Relative: 8.5 %
Neutro Abs: 5647 cells/uL (ref 1500–7800)
Neutrophils Relative %: 72.4 %
Platelets: 223 10*3/uL (ref 140–400)
RBC: 4.11 10*6/uL (ref 3.80–5.10)
RDW: 12.2 % (ref 11.0–15.0)
Total Lymphocyte: 16.4 %
WBC: 7.8 10*3/uL (ref 3.8–10.8)

## 2021-05-13 LAB — LIPID PANEL
Cholesterol: 174 mg/dL (ref <200)
HDL: 81 mg/dL (ref >=50)
LDL Cholesterol (Calc): 74 mg/dL (calc)
Non-HDL Cholesterol (Calc): 93 mg/dL (calc) (ref <130)
Total CHOL/HDL Ratio: 2.1 (calc) (ref <5.0)
Triglycerides: 103 mg/dL (ref <150)

## 2021-05-13 LAB — TSH: TSH: 1.79 mIU/L (ref 0.40–4.50)

## 2021-05-13 LAB — MAGNESIUM: Magnesium: 2 mg/dL (ref 1.5–2.5)

## 2021-05-13 NOTE — Patient Instructions (Signed)
Access Code: [facility] ?URL: [email or web address] ?Date: 05/13/2021 ?Prepared by: Ethel Rana ? ?Exercises ?- Supine Lower Trunk Rotation  - 1 x daily - 7 x weekly - 2 sets - 30 sec hold ?- Supine Posterior Pelvic Tilt  - 1 x daily - 7 x weekly - 2 sets - 10 reps - 3 sec hold ?- Supine Bridge  - 1 x daily - 7 x weekly - 2 sets - 1- reps - 3 sec hold ?- Clamshell with Resistance  - 1 x daily - 7 x weekly - 2 sets - 10 reps - 3 sec hold ?- Single Knee to Chest Stretch  - 1 x daily - 7 x weekly - 3 reps - 15 hold ?- Supine Double Knee to Chest  - 1 x daily - 7 x weekly - 3 reps - 15 hold ?- Supine Hamstring Stretch  - 1 x daily - 7 x weekly - 3 reps - 15 hold ?- Supine Figure 4 Piriformis Stretch  - 1 x daily - 7 x weekly - 3 reps - 15 hold ?- Standing 'L' Stretch at Counter  - 1 x daily - 7 x weekly - 2 sets - 30 sec hold ?

## 2021-05-13 NOTE — Therapy (Signed)
[facility] ?[place] [address] ?[address] ?[place], [place], [address] ?Phone: [phone]   Fax:  [phone] ? ?Physical Therapy Evaluation ? ?Patient Details  ?Name: Kelsey Lopez ?MRN: [id number] ?Date of Birth: 1940/02/12 ?Referring Provider (PT): Jairo Ben ? ? ?Encounter Date: 05/13/2021 ? ? PT End of Session - 05/13/21 1905   ? ? Visit Number 1   ? Number of Visits 4   ? Date for PT Re-Evaluation 06/10/21   ? PT Start Time 1545   ? PT Stop Time 1630   ? PT Time Calculation (min) 45 min   ? Activity Tolerance Patient tolerated treatment well   ? Behavior During Therapy [facility] for tasks assessed/performed   ? ?  ?  ? ?  ? ? ?Past Medical History:  ?Diagnosis Date  ? Anxiety   ? Dyslipidemia   ? on Rx x20 years  ? HTN (hypertension)   ? Hyperlipidemia   ? Insomnia   ? Morton's neuroma of left foot   ? Multiple allergies   ? Osteoarthritis   ? Polycythemia   ? PVC (premature ventricular contraction)   ? ? ?Past Surgical History:  ?Procedure Laterality Date  ? ABDOMINAL HYSTERECTOMY    ? CATARACT EXTRACTION Right 2019  ? Dr. Ellie Lunch   ? KNEE ARTHROSCOPY    ? R  ? TONSILLECTOMY    ? VESICOVAGINAL FISTULA CLOSURE W/ TAH    ? ? ?There were no vitals filed for this visit. ? ? ? Subjective Assessment - 05/13/21 1540   ? ? Subjective Patient received an injection in her back which does seem to have helped her back pain.   ? Patient Stated Goals Assess need for further therapy.   ? Currently in Pain? No/denies   ? ?  ?  ? ?  ? ? ? ? ? [facility] PT Assessment - 05/13/21 0001   ? ?  ? Assessment  ? Medical Diagnosis low Back Pain   ? Referring Provider (PT) Jairo Ben   ? Prior Therapy Yes, back   ?  ? Precautions  ? Precautions None   ?  ? Balance Screen  ? Has the patient fallen in the past 6 months Yes   ?  ? Home Environment  ? Living Environment Private residence   ? Living Arrangements Alone   ? Available Help at Discharge Family   ? Type of Home House   ? Home Access Level  entry   ? Home Layout One level   ?  ? Prior Function  ? Level of Independence Independent   ?  ? Cognition  ? Overall Cognitive Status Within Functional Limits for tasks assessed   ?  ? Observation/Other Assessments  ? Observations round shoulders, increased thoracic kyphosis, R pelvic obliquity, B LE edema   ? Scoliosis R thoracic rib hump   ?  ? Sensation  ? Light Touch Appears Intact   ?  ? ROM / Strength  ? AROM / PROM / Strength AROM;Strength   ?  ? AROM  ? Overall AROM Comments lumbar ROM WFL in all planes, BLE WFL, L hip ER limited, R knee valgus noted.   ?  ? Strength  ? Overall Strength Comments B hip abductors and extensors mildly weak. B knee ext midly weak with crepitus.   ?  ? Flexibility  ? Soft Tissue Assessment /Muscle Length yes   ? Hamstrings SLR 70L, 75R.   ? ITB L piriformis  tight   ? ?  ?  ? ?  ? ? ? ? ? ? ? ? ? ? ? ? ? ?Objective measurements completed on examination: See above findings.  ? ? ? ? ? ? ? ? ? ? ? ? ? ? PT Education - 05/13/21 1619   ? ? Education Details HEP. POC to update HEP for strength and stretch.   ? Person(s) Educated Patient   ? Methods Explanation;Demonstration;Handout   ? Comprehension Returned demonstration;Verbalized understanding   ? ?  ?  ? ?  ? ? ? PT Short Term Goals - 05/13/21 1912   ? ?  ? PT SHORT TERM GOAL #1  ? Title I with HEP   ? Time 4   ? Period Weeks   ? Status New   ? Target Date 06/10/21   ? ?  ?  ? ?  ? ? ? ? PT Long Term Goals - 05/13/21 1913   ? ?  ? PT LONG TERM GOAL #1  ? Title Pt will be ind with HEP   ? Time 4   ? Period Weeks   ? Status New   ? Target Date 06/10/21   ? ?  ?  ? ?  ? ? ? ? ? ? ? ? ? Plan - 05/13/21 1906   ? ? Clinical Impression Statement Patient referred for PT to address LBP and possibly for DN for spasms. Patient has since had a spinal injection and reports no pain or spasm. In assessment she demosntrates some mild weakness and mildly diminished ROM. She will benefit from establishing and HEP for strengthening in order to  stabilize trunk to minimize risk of returning back pain and also for stretching for symptom relief if her pain returns..   ? Personal Factors and Comorbidities Fitness;Age;Past/Current Experience   ? Stability/Clinical Decision Making Stable/Uncomplicated   ? Clinical Decision Making Low   ? Rehab Potential Good   ? PT Frequency 1x / week   ? PT Duration 4 weeks   ? PT Treatment/Interventions Manual techniques;Dry needling;Passive range of motion;Therapeutic exercise;Therapeutic activities;Neuromuscular re-education;Functional mobility training   ? PT Next Visit Plan UPdate HEP for strength   ? PT Home Exercise Plan [facility]   ? Consulted and Agree with Plan of Care Patient   ? ?  ?  ? ?  ? ? ?Patient will benefit from skilled therapeutic intervention in order to improve the following deficits and impairments:  Decreased range of motion, Decreased strength, Improper body mechanics, Postural dysfunction ? ?Visit Diagnosis: ?Muscle weakness (generalized) ? ?Abnormal posture ? ? ? ? ?Problem List ?Patient Active Problem List  ? Diagnosis Date Noted  ? Elevated LFTs 05/13/2021  ? High risk medication use 05/12/2021  ? SVT (supraventricular tachycardia) ([place]) 07/12/2020  ? Osteopenia 04/15/2020  ? Unilateral primary osteoarthritis, left knee 05/04/2019  ? Unilateral primary osteoarthritis, right knee 05/04/2019  ? CKD (chronic kidney disease) stage 2, GFR 60-89 ml/min 01/14/2017  ? Depression, major, in remission ([place]) 11/27/2014  ? Overweight (BMI 25.0-29.9) 04/10/2014  ? Insomnia 04/10/2014  ? Vitamin D deficiency 01/07/2014  ? Abnormal glucose 01/03/2014  ? Medication management 01/03/2014  ? Anxiety   ? Osteoarthritis   ? Hyperlipidemia, mixed 06/05/2009  ? Essential hypertension 06/05/2009  ? ? ?Marcelina Morel, DPT ?05/13/2021, 7:14 PM ? ?[facility] ?[address] ?[address] ?[place], [place], [address] ?Phone: [phone]   Fax:  [phone] ? ?Name: Kelsey Lopez ?  MRN:  [id number] ?Date of Birth: [birth year] ? ? ?

## 2021-05-20 ENCOUNTER — Encounter: Payer: Self-pay | Admitting: Physical Therapy

## 2021-05-20 ENCOUNTER — Ambulatory Visit: Payer: Medicare Other | Admitting: Physical Therapy

## 2021-05-20 DIAGNOSIS — R293 Abnormal posture: Secondary | ICD-10-CM

## 2021-05-20 DIAGNOSIS — M546 Pain in thoracic spine: Secondary | ICD-10-CM

## 2021-05-20 DIAGNOSIS — R2689 Other abnormalities of gait and mobility: Secondary | ICD-10-CM

## 2021-05-20 DIAGNOSIS — M545 Low back pain, unspecified: Secondary | ICD-10-CM | POA: Diagnosis not present

## 2021-05-20 DIAGNOSIS — M6281 Muscle weakness (generalized): Secondary | ICD-10-CM | POA: Diagnosis not present

## 2021-05-20 DIAGNOSIS — G8929 Other chronic pain: Secondary | ICD-10-CM

## 2021-05-20 NOTE — Patient Instructions (Signed)
Access Code: [facility] ?URL: [email or web address] ?Date: 05/20/2021 ?Prepared by: Ethel Rana ? ?Exercises ?- Supine Lower Trunk Rotation  - 1 x daily - 7 x weekly - 2 sets - 30 sec hold ?- Supine Posterior Pelvic Tilt  - 1 x daily - 7 x weekly - 2 sets - 10 reps - 3 sec hold ?- Supine Bridge  - 1 x daily - 7 x weekly - 2 sets - 1- reps - 3 sec hold ?- Supine Bridge with Resistance Band  - 1 x daily - 7 x weekly - 2 sets - 10 reps ?- Hooklying Clamshell with Resistance  - 1 x daily - 7 x weekly - 2 sets - 10 reps ?- Single Knee to Chest Stretch  - 1 x daily - 7 x weekly - 3 reps - 15 hold ?- Supine Double Knee to Chest  - 1 x daily - 7 x weekly - 3 reps - 15 hold ?- Supine Hamstring Stretch  - 1 x daily - 7 x weekly - 3 reps - 15 hold ?- Supine Figure 4 Piriformis Stretch  - 1 x daily - 7 x weekly - 3 reps - 15 hold ?- Standing 'L' Stretch at Counter  - 1 x daily - 7 x weekly - 2 sets - 30 sec hold ?- Sit to Stand Without Arm Support  - 1 x daily - 7 x weekly - 2 sets - 10 reps ?

## 2021-05-20 NOTE — Therapy (Signed)
[place] ?[place] ?[address] ?[place], [place], [address] ?Phone: [phone]   Fax:  [phone] ? ?Physical Therapy Treatment ? ?Patient Details  ?Name: Kelsey Lopez ?MRN: [id number] ?Date of Birth: July 15, 1940 ?Referring Provider (PT): Jairo Ben ? ? ?Encounter Date: 05/20/2021 ? ? PT End of Session - 05/20/21 1710   ? ? Visit Number 2   ? PT Start Time [phone]   ? PT Stop Time 1710   ? PT Time Calculation (min) 38 min   ? Activity Tolerance Patient tolerated treatment well   ? Behavior During Therapy [facility] for tasks assessed/performed   ? ?  ?  ? ?  ? ? ?Past Medical History:  ?Diagnosis Date  ? Anxiety   ? Dyslipidemia   ? on Rx x20 years  ? HTN (hypertension)   ? Hyperlipidemia   ? Insomnia   ? Morton's neuroma of left foot   ? Multiple allergies   ? Osteoarthritis   ? Polycythemia   ? PVC (premature ventricular contraction)   ? ? ?Past Surgical History:  ?Procedure Laterality Date  ? ABDOMINAL HYSTERECTOMY    ? CATARACT EXTRACTION Right 2019  ? Dr. Ellie Lunch   ? KNEE ARTHROSCOPY    ? R  ? TONSILLECTOMY    ? VESICOVAGINAL FISTULA CLOSURE W/ TAH    ? ? ?There were no vitals filed for this visit. ? ? Subjective Assessment - 05/20/21 1636   ? ? Subjective Patient reports that her back pain returned over the weekend. It randomly hurts, no pattern identified.   ? Patient Stated Goals Assess need for further therapy.   ? Currently in Pain? No/denies   ? ?  ?  ? ?  ? ? ? ? ? ? ? ? ? ? ? ? ? ? ? ? ? ? ? ? [place] Adult PT Treatment/Exercise - 05/20/21 0001   ? ?  ? Exercises  ? Exercises Lumbar   ?  ? Lumbar Exercises: Stretches  ? Active Hamstring Stretch Right;Left;3 reps;20 seconds   ? Single Knee to Chest Stretch Right;Left;3 reps;20 seconds   ? Double Knee to Chest Stretch 3 reps;20 seconds   ? ITB Stretch Right;Left;3 reps;20 seconds   ? Piriformis Stretch Right;Left;3 reps;20 seconds   ? Other Lumbar Stretch Exercise seated forward stretch 3 x 10 seconds   ?  ?  Lumbar Exercises: Aerobic  ? Nustep L5 x 3 minutes. Patient reported pain in her R knee, so further use deferred today.   ?  ? Lumbar Exercises: Seated  ? Sit to Stand 10 reps   ?  ? Lumbar Exercises: Supine  ? Pelvic Tilt 10 reps;5 seconds   ? Bridge Non-compliant;10 reps;2 seconds   ? ?  ?  ? ?  ? ? ? ? ? ? ? ? ? ? PT Education - 05/20/21 1652   ? ? Education Details educated her to try stretching and very gentle exercise prior to getting out of bed in the morning.   ? ?  ?  ? ?  ? ? ? PT Short Term Goals - 05/20/21 1654   ? ?  ? PT SHORT TERM GOAL #1  ? Title I with HEP   ? Time 3   ? Period Weeks   ? Status On-going   ? Target Date 06/10/21   ? ?  ?  ? ?  ? ? ? ? PT Long Term Goals - 05/13/21 1913   ? ?  ?  PT LONG TERM GOAL #1  ? Title Pt will be ind with HEP   ? Time 4   ? Period Weeks   ? Status New   ? Target Date 06/10/21   ? ?  ?  ? ?  ? ? ? ? ? ? ? ? Plan - 05/20/21 1647   ? ? Clinical Impression Statement Patient reports that her back pain returned over the weekend. She reports she has not tried her stretches sincer pain returned, so reviewed them and had her perform all stretches.   ? Personal Factors and Comorbidities Fitness;Age;Past/Current Experience   ? Stability/Clinical Decision Making Stable/Uncomplicated   ? Clinical Decision Making Low   ? Rehab Potential Good   ? PT Frequency 1x / week   ? PT Duration 4 weeks   ? PT Treatment/Interventions Manual techniques;Dry needling;Passive range of motion;Therapeutic exercise;Therapeutic activities;Neuromuscular re-education;Functional mobility training   ? PT Home Exercise Plan [facility]   ? Consulted and Agree with Plan of Care Patient   ? ?  ?  ? ?  ? ? ?Patient will benefit from skilled therapeutic intervention in order to improve the following deficits and impairments:  Decreased range of motion, Decreased strength, Improper body mechanics, Postural dysfunction ? ?Visit Diagnosis: ?Muscle weakness (generalized) ? ?Abnormal posture ? ?Other  abnormalities of gait and mobility ? ?Pain in thoracic spine ? ?Chronic bilateral low back pain, unspecified whether sciatica present ? ? ? ? ?Problem List ?Patient Active Problem List  ? Diagnosis Date Noted  ? Elevated LFTs 05/13/2021  ? High risk medication use 05/12/2021  ? SVT (supraventricular tachycardia) ([place]) 07/12/2020  ? Osteopenia 04/15/2020  ? Unilateral primary osteoarthritis, left knee 05/04/2019  ? Unilateral primary osteoarthritis, right knee 05/04/2019  ? CKD (chronic kidney disease) stage 2, GFR 60-89 ml/min 01/14/2017  ? Depression, major, in remission ([place]) 11/27/2014  ? Overweight (BMI 25.0-29.9) 04/10/2014  ? Insomnia 04/10/2014  ? Vitamin D deficiency 01/07/2014  ? Abnormal glucose 01/03/2014  ? Medication management 01/03/2014  ? Anxiety   ? Osteoarthritis   ? Hyperlipidemia, mixed 06/05/2009  ? Essential hypertension 06/05/2009  ? ? ?Marcelina Morel, DPT ?05/20/2021, 5:11 PM ? ?[place] ?[address] ?[address] ?[place], [place], [address] ?Phone: [phone]   Fax:  [phone] ? ?Name: Kelsey Lopez ?MRN: [id number] ?Date of Birth: [birth year] ? ? ? ?

## 2021-05-27 ENCOUNTER — Ambulatory Visit: Payer: Medicare Other | Admitting: Physical Therapy

## 2021-05-27 ENCOUNTER — Encounter: Payer: Self-pay | Admitting: Physical Therapy

## 2021-05-27 DIAGNOSIS — M545 Low back pain, unspecified: Secondary | ICD-10-CM | POA: Diagnosis not present

## 2021-05-27 DIAGNOSIS — M6281 Muscle weakness (generalized): Secondary | ICD-10-CM | POA: Diagnosis not present

## 2021-05-27 DIAGNOSIS — R2689 Other abnormalities of gait and mobility: Secondary | ICD-10-CM | POA: Diagnosis not present

## 2021-05-27 DIAGNOSIS — M546 Pain in thoracic spine: Secondary | ICD-10-CM

## 2021-05-27 DIAGNOSIS — G8929 Other chronic pain: Secondary | ICD-10-CM | POA: Diagnosis not present

## 2021-05-27 DIAGNOSIS — R293 Abnormal posture: Secondary | ICD-10-CM | POA: Diagnosis not present

## 2021-05-27 NOTE — Therapy (Signed)
[place] ?[place] ?[address] ?[place], [place], [address] ?Phone: [phone]   Fax:  [phone] ? ?Physical Therapy Treatment ? ?Patient Details  ?Name: Kelsey Lopez ?MRN: [id number] ?Date of Birth: 30-Nov-1940 ?Referring Provider (PT): Jairo Ben ? ? ?Encounter Date: 05/27/2021 ? ? PT End of Session - 05/27/21 1225   ? ? Visit Number 3   ? Number of Visits 4   ? Date for PT Re-Evaluation 06/10/21   ? PT Start Time 1146   ? PT Stop Time 5188   ? PT Time Calculation (min) 38 min   ? Activity Tolerance Patient tolerated treatment well   ? Behavior During Therapy [facility] for tasks assessed/performed   ? ?  ?  ? ?  ? ? ?Past Medical History:  ?Diagnosis Date  ? Anxiety   ? Dyslipidemia   ? on Rx x20 years  ? HTN (hypertension)   ? Hyperlipidemia   ? Insomnia   ? Morton's neuroma of left foot   ? Multiple allergies   ? Osteoarthritis   ? Polycythemia   ? PVC (premature ventricular contraction)   ? ? ?Past Surgical History:  ?Procedure Laterality Date  ? ABDOMINAL HYSTERECTOMY    ? CATARACT EXTRACTION Right 2019  ? Dr. Ellie Lunch   ? KNEE ARTHROSCOPY    ? R  ? TONSILLECTOMY    ? VESICOVAGINAL FISTULA CLOSURE W/ TAH    ? ? ?There were no vitals filed for this visit. ? ? Subjective Assessment - 05/27/21 1149   ? ? Subjective I'm doing good. My back hurts but it's always hurting it never really goes away.   ? Currently in Pain? Yes   ? Pain Score 5    ? Pain Location Back   ? Pain Orientation Left   ? Pain Descriptors / Indicators Nagging;Constant   ? ?  ?  ? ?  ? ? ? ? ? ? ? ? ? ? ? ? ? ? ? ? ? ? ? ? [place] Adult PT Treatment/Exercise - 05/27/21 0001   ? ?  ? Lumbar Exercises: Stretches  ? Passive Hamstring Stretch Left;Right;2 reps;20 seconds   ? Single Knee to Chest Stretch Left;Right;2 reps;20 seconds   ? Double Knee to Chest Stretch 3 reps;20 seconds   ?  ? Lumbar Exercises: Aerobic  ? Nustep L5 x 6 minutes   ?  ? Lumbar Exercises: Machines for Strengthening  ? Other  Lumbar Machine Exercise lat pull down 15# 10 reps; 20# 10 reps   ? Other Lumbar Machine Exercise trunk extension 10# 2x10 both sides   ?  ? Lumbar Exercises: Standing  ? Row AROM;Strengthening;Both;10 reps;Theraband   red TB 2 sets  ? Shoulder Extension AROM;Strengthening;Both;10 reps   2 sets, red TB  ? Other Standing Lumbar Exercises shoulder flex 2x10 with 2# cane   ? Other Standing Lumbar Exercises hip flex 10 reps red Tb; hip abd 10 each sides   attempted red TB with hip abd but caused increased in pain  ?  ? Lumbar Exercises: Seated  ? Sit to Stand 10 reps   2 sets no UE  ? Other Seated Lumbar Exercises ball squeezes 2x10   ?  ? Lumbar Exercises: Supine  ? Bridge Compliant;10 reps;3 seconds   2 sets  ? Bridge with clamshell Compliant;10 reps;3 seconds   2 sets. red TB  ? ?  ?  ? ?  ? ? ? ? ? ? ? ? ? ? ? ?  PT Short Term Goals - 05/20/21 1654   ? ?  ? PT SHORT TERM GOAL #1  ? Title I with HEP   ? Time 3   ? Period Weeks   ? Status On-going   ? Target Date 06/10/21   ? ?  ?  ? ?  ? ? ? ? PT Long Term Goals - 05/13/21 1913   ? ?  ? PT LONG TERM GOAL #1  ? Title Pt will be ind with HEP   ? Time 4   ? Period Weeks   ? Status New   ? Target Date 06/10/21   ? ?  ?  ? ?  ? ? ? ? ? ? ? ? Plan - 05/27/21 1227   ? ? Clinical Impression Statement Patient was compliant and stated she was feeling better. She had trouble standing on her R foot during hip abd. Attempted red TB w/ hip abd but was unable due to increase in pain, she was able to it with no TB. VC's needed to slow down and control motions for rows, shoulder ext, lat pull downs, and hip abd. Focused today's session on stretching and strengthening back.   ? Personal Factors and Comorbidities Fitness;Age;Past/Current Experience   ? Stability/Clinical Decision Making Stable/Uncomplicated   ? Clinical Decision Making Low   ? Rehab Potential Good   ? PT Frequency 1x / week   ? PT Duration 4 weeks   ? PT Treatment/Interventions Manual techniques;Dry needling;Passive  range of motion;Therapeutic exercise;Therapeutic activities;Neuromuscular re-education;Functional mobility training   ? PT Next Visit Plan UPdate HEP for strength   ? PT Home Exercise Plan [facility]   ? Consulted and Agree with Plan of Care Patient   ? ?  ?  ? ?  ? ? ?Patient will benefit from skilled therapeutic intervention in order to improve the following deficits and impairments:  Decreased range of motion, Decreased strength, Improper body mechanics, Postural dysfunction ? ?Visit Diagnosis: ?Muscle weakness (generalized) ? ?Abnormal posture ? ?Pain in thoracic spine ? ?Chronic bilateral low back pain, unspecified whether sciatica present ? ?Other abnormalities of gait and mobility ? ? ? ? ?Problem List ?Patient Active Problem List  ? Diagnosis Date Noted  ? Elevated LFTs 05/13/2021  ? High risk medication use 05/12/2021  ? SVT (supraventricular tachycardia) ([employer]) 07/12/2020  ? Osteopenia 04/15/2020  ? Unilateral primary osteoarthritis, left knee 05/04/2019  ? Unilateral primary osteoarthritis, right knee 05/04/2019  ? CKD (chronic kidney disease) stage 2, GFR 60-89 ml/min 01/14/2017  ? Depression, major, in remission ([place]) 11/27/2014  ? Overweight (BMI 25.0-29.9) 04/10/2014  ? Insomnia 04/10/2014  ? Vitamin D deficiency 01/07/2014  ? Abnormal glucose 01/03/2014  ? Medication management 01/03/2014  ? Anxiety   ? Osteoarthritis   ? Hyperlipidemia, mixed 06/05/2009  ? Essential hypertension 06/05/2009  ? ? ?Kelsey Lopez ?05/27/2021, 12:31 PM ? ?[place] ?[place] ?[address] ?[place], [place], [address] ?Phone: [phone]   Fax:  [phone] ? ?Name: Kelsey Lopez ?MRN: [id number] ?Date of Birth: 1940/02/14 ? ? ? ?

## 2021-06-02 DIAGNOSIS — Z1231 Encounter for screening mammogram for malignant neoplasm of breast: Secondary | ICD-10-CM | POA: Diagnosis not present

## 2021-06-02 LAB — HM MAMMOGRAPHY

## 2021-06-03 ENCOUNTER — Ambulatory Visit: Payer: Medicare Other | Admitting: Physical Therapy

## 2021-06-05 ENCOUNTER — Ambulatory Visit: Payer: Medicare Other | Admitting: Physical Therapy

## 2021-06-05 DIAGNOSIS — R2689 Other abnormalities of gait and mobility: Secondary | ICD-10-CM

## 2021-06-05 DIAGNOSIS — M6281 Muscle weakness (generalized): Secondary | ICD-10-CM

## 2021-06-05 DIAGNOSIS — G8929 Other chronic pain: Secondary | ICD-10-CM | POA: Diagnosis not present

## 2021-06-05 DIAGNOSIS — R293 Abnormal posture: Secondary | ICD-10-CM

## 2021-06-05 DIAGNOSIS — M546 Pain in thoracic spine: Secondary | ICD-10-CM | POA: Diagnosis not present

## 2021-06-05 DIAGNOSIS — M545 Low back pain, unspecified: Secondary | ICD-10-CM | POA: Diagnosis not present

## 2021-06-05 NOTE — Therapy (Signed)
[place] ?[place] ?[address] ?[place], [place], [address] ?Phone: [phone]   Fax:  [phone] ? ?Physical Therapy Treatment ? ?Patient Details  ?Name: Kelsey Lopez ?MRN: [id number] ?Date of Birth: 04/12/1940 ?Referring Provider (PT): Jairo Ben ? ? ?Encounter Date: 06/05/2021 ? ? PT End of Session - 06/05/21 1150   ? ? Visit Number 4   ? Number of Visits 4   ? Date for PT Re-Evaluation 06/10/21   ? PT Start Time 1103   ? PT Stop Time 1141   ? PT Time Calculation (min) 38 min   ? Activity Tolerance Patient tolerated treatment well;No increased pain   ? Behavior During Therapy [facility] for tasks assessed/performed   ? ?  ?  ? ?  ? ? ?Past Medical History:  ?Diagnosis Date  ? Anxiety   ? Dyslipidemia   ? on Rx x20 years  ? HTN (hypertension)   ? Hyperlipidemia   ? Insomnia   ? Morton's neuroma of left foot   ? Multiple allergies   ? Osteoarthritis   ? Polycythemia   ? PVC (premature ventricular contraction)   ? ? ?Past Surgical History:  ?Procedure Laterality Date  ? ABDOMINAL HYSTERECTOMY    ? CATARACT EXTRACTION Right 2019  ? Dr. Ellie Lunch   ? KNEE ARTHROSCOPY    ? R  ? TONSILLECTOMY    ? VESICOVAGINAL FISTULA CLOSURE W/ TAH    ? ? ?There were no vitals filed for this visit. ? ? Subjective Assessment - 06/05/21 1109   ? ? Subjective Pt states that things are going well. Her back is not bothering her right now and overall it feels improved.   ? Currently in Pain? No/denies   ? ?  ?  ? ?  ? ? ? ? ? ? ? ? ? ? ? ? ? ? ? ? ? ? ? ? [place] Adult PT Treatment/Exercise - 06/05/21 0001   ? ?  ? Lumbar Exercises: Aerobic  ? Nustep L5 x6 minutes   ?  ? Lumbar Exercises: Machines for Strengthening  ? Other Lumbar Machine Exercise lat pulldown #20 2x10 reps   ? Other Lumbar Machine Exercise pallof press each direction #5 x10 reps   ?  ? Lumbar Exercises: Standing  ? Row Both;10 reps;Theraband   ? Theraband Level (Row) Level 3 (Green);Level 4 (Blue)   blue TB 2nd set  ? Shoulder  Extension Strengthening;Both;10 reps;Theraband   ? Theraband Level (Shoulder Extension) Level 4 (Blue);Level 3 (Green)   ?  ? Lumbar Exercises: Seated  ? Sit to Stand 10 reps   ? Sit to Stand Limitations x2 sets: 1st with adductor ball squeeze, 2nd with red TB around knees   ? Other Seated Lumbar Exercises ball squeezes 10x3 sec hold   ?  ? Lumbar Exercises: Sidelying  ? Hip Abduction 10 reps;Both   ? Hip Abduction Limitations x2 sets each side   ? ?  ?  ? ?  ? ? ? ? ? ? ? ? ? ? PT Education - 06/05/21 1150   ? ? Education Details updates to HEP resistance   ? Person(s) Educated Patient   ? Methods Explanation   ? Comprehension Verbalized understanding   ? ?  ?  ? ?  ? ? ? PT Short Term Goals - 05/20/21 1654   ? ?  ? PT SHORT TERM GOAL #1  ? Title I with HEP   ? Time 3   ?  Period Weeks   ? Status On-going   ? Target Date 06/10/21   ? ?  ?  ? ?  ? ? ? ? PT Long Term Goals - 05/13/21 1913   ? ?  ? PT LONG TERM GOAL #1  ? Title Pt will be ind with HEP   ? Time 4   ? Period Weeks   ? Status New   ? Target Date 06/10/21   ? ?  ?  ? ?  ? ? ? ? ? ? ? ? Plan - 06/05/21 1151   ? ? Clinical Impression Statement Pt's back pain is improved, noting overall decreased frequency since beginning PT. Session continued with therex to improve trunk/hip strength. Pt was able to progress theraband resistance with rows and shoulder extension, requiring some PT cuing for improved technique. Pt's last scheduled visit is next week and we will plan on dc at that time with an advanced HEP.   ? Personal Factors and Comorbidities Fitness;Age;Past/Current Experience   ? Stability/Clinical Decision Making Stable/Uncomplicated   ? Rehab Potential Good   ? PT Frequency 1x / week   ? PT Duration 4 weeks   ? PT Treatment/Interventions Manual techniques;Dry needling;Passive range of motion;Therapeutic exercise;Therapeutic activities;Neuromuscular re-education;Functional mobility training   ? PT Next Visit Plan finalize HEP and DC   ? PT Home Exercise  Plan [facility]   ? Consulted and Agree with Plan of Care Patient   ? ?  ?  ? ?  ? ? ?Patient will benefit from skilled therapeutic intervention in order to improve the following deficits and impairments:  Decreased range of motion, Decreased strength, Improper body mechanics, Postural dysfunction ? ?Visit Diagnosis: ?Muscle weakness (generalized) ? ?Abnormal posture ? ?Pain in thoracic spine ? ?Chronic bilateral low back pain, unspecified whether sciatica present ? ?Other abnormalities of gait and mobility ? ? ? ? ?Problem List ?Patient Active Problem List  ? Diagnosis Date Noted  ? Elevated LFTs 05/13/2021  ? High risk medication use 05/12/2021  ? SVT (supraventricular tachycardia) ([place]) 07/12/2020  ? Osteopenia 04/15/2020  ? Unilateral primary osteoarthritis, left knee 05/04/2019  ? Unilateral primary osteoarthritis, right knee 05/04/2019  ? CKD (chronic kidney disease) stage 2, GFR 60-89 ml/min 01/14/2017  ? Depression, major, in remission ([place]) 11/27/2014  ? Overweight (BMI 25.0-29.9) 04/10/2014  ? Insomnia 04/10/2014  ? Vitamin D deficiency 01/07/2014  ? Abnormal glucose 01/03/2014  ? Medication management 01/03/2014  ? Anxiety   ? Osteoarthritis   ? Hyperlipidemia, mixed 06/05/2009  ? Essential hypertension 06/05/2009  ? ?11:53 AM,06/05/21 ?Sherol Dade PT, DPT ? ? ? ?[place] ?[place] ?[address] ?[place], [place], [address] ?Phone: [phone]   Fax:  [phone] ? ?Name: Kelsey Lopez ?MRN: [id number] ?Date of Birth: 08/20/1940 ? ? ? ?

## 2021-06-08 ENCOUNTER — Other Ambulatory Visit: Payer: Self-pay | Admitting: Internal Medicine

## 2021-06-08 ENCOUNTER — Other Ambulatory Visit: Payer: Self-pay | Admitting: Nurse Practitioner

## 2021-06-10 ENCOUNTER — Ambulatory Visit: Payer: Medicare Other | Admitting: Physical Therapy

## 2021-06-13 ENCOUNTER — Other Ambulatory Visit: Payer: Self-pay | Admitting: Adult Health

## 2021-06-13 DIAGNOSIS — F419 Anxiety disorder, unspecified: Secondary | ICD-10-CM

## 2021-06-24 ENCOUNTER — Other Ambulatory Visit: Payer: Self-pay | Admitting: Physician Assistant

## 2021-06-26 ENCOUNTER — Encounter: Payer: Self-pay | Admitting: Adult Health

## 2021-06-26 ENCOUNTER — Ambulatory Visit (INDEPENDENT_AMBULATORY_CARE_PROVIDER_SITE_OTHER): Payer: Medicare Other | Admitting: Adult Health

## 2021-06-26 VITALS — BP 118/70 | HR 56 | Temp 97.5°F | Wt 165.0 lb

## 2021-06-26 DIAGNOSIS — I10 Essential (primary) hypertension: Secondary | ICD-10-CM | POA: Diagnosis not present

## 2021-06-26 DIAGNOSIS — Z79899 Other long term (current) drug therapy: Secondary | ICD-10-CM

## 2021-06-26 DIAGNOSIS — R7989 Other specified abnormal findings of blood chemistry: Secondary | ICD-10-CM

## 2021-06-26 NOTE — Progress Notes (Signed)
Assessment and Plan:  Ervin was seen today for hypertension.  Diagnoses and all orders for this visit:  Essential hypertension Continue current medication Monitor blood pressure at home; call if consistently over 130/80 Continue DASH diet.   Reminder to go to the ER if any CP, SOB, nausea, dizziness, severe HA, changes vision/speech, left arm numbness and tingling and jaw pain. -     COMPLETE METABOLIC PANEL WITH GFR  Medication management -     COMPLETE METABOLIC PANEL WITH GFR  Elevated LFTs Check labs for persistence, plan liver US unless resolved  May need to reduce tylenol, low processed carb diet and weight loss encouraged -     COMPLETE METABOLIC PANEL WITH GFR   Further disposition pending results of labs. Discussed med's effects and SE's.   Over 15 minutes of exam, counseling, chart review, and critical decision making was performed.   Future Appointments  Date Time Provider [place]  01/19/2022  2:00 PM Unk Pinto, MD [facility] None  05/13/2022  4:00 PM Liane Comber, NP [facility] None    ------------------------------------------------------------------------------------------------------------------   HPI BP 118/70   Pulse (!) 56   Temp (!) 97.5 F (36.4 C)   Wt 165 lb (74.8 kg)   SpO2 98%   BMI 27.46 kg/m  81 y.o.female presents for 4 week follow up on BP after med change and also LFT recheck.   BP Readings from Last 3 Encounters:  06/26/21 118/70  05/12/21 (!) 156/78  05/07/21 (!) 159/66   He BP medication was increased to losartan/hctz 100/25 last visit.   Her blood pressure has been controlled at home, today their BP is BP: 118/70  She denies chest pain, shortness of breath, dizziness.  New LFT elevation, no med change preceding 1000 mg BID for arthritis, chronic, unchanged Rare glass of wine Lab Results  Component Value Date   ALT 39 (H) 05/12/2021   AST 44 (H) 05/12/2021   ALKPHOS 93 06/28/2016   BILITOT 0.7  05/12/2021    Past Medical History:  Diagnosis Date   Anxiety    Dyslipidemia    on Rx x20 years   HTN (hypertension)    Hyperlipidemia    Insomnia    Morton's neuroma of left foot    Multiple allergies    Osteoarthritis    Polycythemia    PVC (premature ventricular contraction)      Allergies  Allergen Reactions   Ace Inhibitors     Cough   Ampicillin     rash   Mevacor [Lovastatin]     Elevated LFT's   Penicillins     REACTION: hives and  itching   Sulfa Antibiotics     rash    Current Outpatient Medications on File Prior to Visit  Medication Sig   ALPRAZolam (XANAX) 0.5 MG tablet TAKE 1/2 -1 TABLET AT BEDTIME ONLY IF NEEDED FOR SLEEP & LIMIT TO 5 DAYS /WEEK TO AVOID ADDICTION & DEMENTIA   aspirin 81 MG EC tablet Take 81 mg by mouth daily.   cetirizine (ZYRTEC) 10 MG tablet Take 10 mg by mouth as needed.   Cholecalciferol (VITAMIN D3) 2000 UNITS capsule Take 2,000 Units by mouth daily.   citalopram (CELEXA) 20 MG tablet Take  1 tablet  Daily for Mood                                                   /  TAKE                              BY                      MOUTH   conjugated estrogens (PREMARIN) vaginal cream Place 1 Applicatorful vaginally. Use once every week   Flaxseed, Linseed, (FLAX SEED OIL) 1000 MG CAPS Take 1,000 mg by mouth daily.   losartan-hydrochlorothiazide (HYZAAR) 100-25 MG tablet Take 1 tablet by mouth daily.   Magnesium 400 MG CAPS Take 400 mg by mouth daily.   meloxicam (MOBIC) 15 MG tablet TAKE 1/2 TO 1 TABLET DAILY WITH FOOD FOR PAIN & INFLAMMATION   montelukast (SINGULAIR) 10 MG tablet Take  1 tablet  Daily for Allergies   Multiple Vitamin (MULTIVITAMIN) tablet Take 1 tablet by mouth daily.   NON FORMULARY Calcium 1800 mg 1 tablet daily   OVER THE COUNTER MEDICATION Otc Mucinex 1 tab daily   simvastatin (ZOCOR) 40 MG tablet Take  1 tablet  at Bedtime  for Cholesterol                                                    /                                    TAKE                    BY                       MOUTH   tiZANidine (ZANAFLEX) 4 MG tablet TAKE 1 TABLET (4 MG TOTAL) BY MOUTH EVERY 8 (EIGHT) HOURS AS NEEDED FOR MUSCLE SPASMS   traMADol (ULTRAM) 50 MG tablet TAKE 1-2 TABLETS BY MOUTH DAILY AS NEEDED.   traZODone (DESYREL) 150 MG tablet TAKE 1/2 TO 1 TABLET 1 HOUR BEFORE BEDTIME IF NEEDED FOR SLEEP   vitamin C (ASCORBIC ACID) 500 MG tablet Take 500 mg by mouth daily.   No current facility-administered medications on file prior to visit.    ROS: all negative except above.   Physical Exam:  BP 118/70   Pulse (!) 56   Temp (!) 97.5 F (36.4 C)   Wt 165 lb (74.8 kg)   SpO2 98%   BMI 27.46 kg/m   General Appearance: Well nourished, in no apparent distress. Eyes: PERRLA, conjunctiva no swelling or erythema ENT/Mouth: Hearing normal.  Neck: Supple Respiratory: Respiratory effort normal, Cardio: Appears well perfused  Lymphatics: Non tender without lymphadenopathy.  Musculoskeletal: obvious R knee valgus deformity; antalgic gait Skin: Warm, dry without rashes, lesions, ecchymosis.  Neuro: Normal muscle tone Psych: Awake and oriented X 3, normal affect, Insight and Judgment appropriate.     Izora Ribas, NP 10:48 AM [facility]

## 2021-06-27 LAB — COMPLETE METABOLIC PANEL WITH GFR
AG Ratio: 2.4 (calc) (ref 1.0–2.5)
ALT: 22 U/L (ref 6–29)
AST: 19 U/L (ref 10–35)
Albumin: 4 g/dL (ref 3.6–5.1)
Alkaline phosphatase (APISO): 68 U/L (ref 37–153)
BUN: 13 mg/dL (ref 7–25)
CO2: 26 mmol/L (ref 20–32)
Calcium: 9.4 mg/dL (ref 8.6–10.4)
Chloride: 100 mmol/L (ref 98–110)
Creat: 0.71 mg/dL (ref 0.60–0.95)
Globulin: 1.7 g/dL (calc) — ABNORMAL LOW (ref 1.9–3.7)
Glucose, Bld: 101 mg/dL — ABNORMAL HIGH (ref 65–99)
Potassium: 4.2 mmol/L (ref 3.5–5.3)
Sodium: 135 mmol/L (ref 135–146)
Total Bilirubin: 0.6 mg/dL (ref 0.2–1.2)
Total Protein: 5.7 g/dL — ABNORMAL LOW (ref 6.1–8.1)
eGFR: 86 mL/min/{1.73_m2} (ref >=60)

## 2021-06-29 NOTE — Telephone Encounter (Signed)
Can you send in with previous instructions?

## 2021-06-29 NOTE — Telephone Encounter (Signed)
Patient would like a refill on Tramadol

## 2021-07-14 ENCOUNTER — Encounter: Payer: Self-pay | Admitting: Internal Medicine

## 2021-08-06 ENCOUNTER — Other Ambulatory Visit: Payer: Self-pay | Admitting: Internal Medicine

## 2021-08-06 ENCOUNTER — Other Ambulatory Visit: Payer: Self-pay | Admitting: Orthopaedic Surgery

## 2021-08-06 ENCOUNTER — Other Ambulatory Visit: Payer: Self-pay | Admitting: Adult Health

## 2021-08-06 DIAGNOSIS — I10 Essential (primary) hypertension: Secondary | ICD-10-CM

## 2021-08-10 ENCOUNTER — Telehealth: Payer: Self-pay | Admitting: Physical Medicine and Rehabilitation

## 2021-08-10 NOTE — Telephone Encounter (Signed)
Pt last injection 02/17/2021 L4-5 interlam. She would like to repeat

## 2021-09-10 ENCOUNTER — Ambulatory Visit: Payer: Medicare Other | Admitting: Physical Medicine and Rehabilitation

## 2021-09-10 ENCOUNTER — Encounter: Payer: Self-pay | Admitting: Physical Medicine and Rehabilitation

## 2021-09-10 ENCOUNTER — Ambulatory Visit: Payer: Self-pay

## 2021-09-10 VITALS — BP 132/68 | HR 76

## 2021-09-10 DIAGNOSIS — M5416 Radiculopathy, lumbar region: Secondary | ICD-10-CM

## 2021-09-10 MED ORDER — METHYLPREDNISOLONE ACETATE 80 MG/ML IJ SUSP
80.0000 mg | Freq: Once | INTRAMUSCULAR | Status: AC
  Administered 2021-09-10: 80 mg

## 2021-09-10 MED ORDER — TRAMADOL HCL 50 MG PO TABS: 50.0000 mg | ORAL_TABLET | Freq: Two times a day (BID) | ORAL | 0 refills | Status: DC | PRN

## 2021-09-10 NOTE — Progress Notes (Signed)
Pt state lower back pain. Pt state any movement makes the pain worse. Pt state she takes pain meds to help ease her pain. Pt state she would like an refill on Tramadol, please advise.  Numeric Pain Rating Scale and Functional Assessment Average Pain 4   In the last MONTH (on 0-10 scale) has pain interfered with the following?  1. General activity like being  able to carry out your everyday physical activities such as walking, climbing stairs, carrying groceries, or moving a chair?  Rating(10)   +Driver, -BT, -Dye Allergies.

## 2021-09-10 NOTE — Patient Instructions (Signed)

## 2021-09-21 NOTE — Progress Notes (Signed)
Kelsey Lopez - 81 y.o. female MRN [id number]  Date of birth: [birth year]  Office Visit Note: Visit Date: 09/10/2021 PCP: Unk Pinto, MD Referred by: Unk Pinto, MD  Subjective: Chief Complaint  Patient presents with   Lower Back - Pain   HPI:  Kelsey Lopez is a 81 y.o. female who comes in today for planned repeat Left L4-5  Lumbar Interlaminar epidural steroid injection with fluoroscopic guidance.  The patient has failed conservative care including home exercise, medications, time and activity modification.  This injection will be diagnostic and hopefully therapeutic.  Please see requesting physician notes for further details and justification. Patient received more than 50% pain relief from prior injection.   Referring: Dr. Eduard Roux   ROS Otherwise per HPI.  Assessment & Plan: Visit Diagnoses:    ICD-10-CM   1. Lumbar radiculopathy  M54.16 XR C-ARM NO REPORT    Epidural Steroid injection    methylPREDNISolone acetate (DEPO-MEDROL) injection 80 mg      Plan: No additional findings.   Meds & Orders:  Meds ordered this encounter  Medications   methylPREDNISolone acetate (DEPO-MEDROL) injection 80 mg   traMADol (ULTRAM) 50 MG tablet    Sig: Take 1 tablet (50 mg total) by mouth every 12 (twelve) hours as needed for moderate pain or severe pain.    Dispense:  20 tablet    Refill:  0    Not to exceed 5 additional fills before 07/15/2021    Orders Placed This Encounter  Procedures   XR C-ARM NO REPORT   Epidural Steroid injection    Follow-up: Return for visit to requesting provider as needed.   Procedures: No procedures performed  Lumbar Epidural Steroid Injection - Interlaminar Approach with Fluoroscopic Guidance  Patient: Kelsey Lopez      Date of Birth: [birth year] MRN: [id number] PCP: Unk Pinto, MD      Visit Date: 09/10/2021   Universal Protocol:     Consent Given By: the patient  Position: PRONE  Additional Comments: Vital signs  were monitored before and after the procedure. Patient was prepped and draped in the usual sterile fashion. The correct patient, procedure, and site was verified.   Injection Procedure Details:   Procedure diagnoses: Lumbar radiculopathy [M54.16]   Meds Administered:  Meds ordered this encounter  Medications   methylPREDNISolone acetate (DEPO-MEDROL) injection 80 mg   traMADol (ULTRAM) 50 MG tablet    Sig: Take 1 tablet (50 mg total) by mouth every 12 (twelve) hours as needed for moderate pain or severe pain.    Dispense:  20 tablet    Refill:  0    Not to exceed 5 additional fills before 07/15/2021     Laterality: Left  Location/Site:  L4-5  Needle: 3.5 in., 20 ga. Tuohy  Needle Placement: Paramedian epidural  Findings:   -Comments: Excellent flow of contrast into the epidural space.  Procedure Details: Using a paramedian approach from the side mentioned above, the region overlying the inferior lamina was localized under fluoroscopic visualization and the soft tissues overlying this structure were infiltrated with 4 ml. of 1% Lidocaine without Epinephrine. The Tuohy needle was inserted into the epidural space using a paramedian approach.   The epidural space was localized using loss of resistance along with counter oblique bi-planar fluoroscopic views.  After negative aspirate for air, blood, and CSF, a 2 ml. volume of Isovue-250 was injected into the epidural space and the flow of contrast was observed. Radiographs were obtained for  documentation purposes.    The injectate was administered into the level noted above.   Additional Comments:  The patient tolerated the procedure well Dressing: 2 x 2 sterile gauze and Band-Aid    Post-procedure details: Patient was observed during the procedure. Post-procedure instructions were reviewed.  Patient left the clinic in stable condition.   Clinical History: MRI LUMBAR SPINE WITHOUT CONTRAST     TECHNIQUE:  Multiplanar,  multisequence MR imaging of the lumbar spine was  performed. No intravenous contrast was administered.     COMPARISON:  Lumbar radiographs 01/16/2021. [facility] CT Abdomen and Pelvis 05/08/2015.     FINDINGS:  Segmentation:  Normal.     Alignment: Moderate dextroconvex lumbar scoliosis with apex at L3  has progressed since 2017. And subsequent straightening of lumbar  lordosis has increased since 2017 also. No significant anterior or  posterior spondylolisthesis.     Vertebrae: Chronic endplate degeneration in the mid and upper lumbar  spine associated with scoliosis. No convincing marrow edema or acute  osseous abnormality. Normal background bone marrow signal. Intact  visible sacrum and SI joints.     Conus medullaris and cauda equina: Conus extends to the L1 level. No  lower spinal cord or conus signal abnormality.     Paraspinal and other soft tissues: Negative.     Disc levels:     T11-T12: Minor right side disc bulge and facet hypertrophy. Mild  right T11 foraminal stenosis.     T12-L1:  Negative.     L1-L2: Disc space loss with circumferential disc bulge and endplate  spurring eccentric to the right. No spinal or lateral recess  stenosis. Mild to moderate right L1 foraminal stenosis.     L2-L3: Severe disc space loss with circumferential disc osteophyte  complex eccentric laterally to the left. Mild left side facet and  ligament flavum hypertrophy. Mild to moderate left lateral recess  stenosis (series 6, image 16 descending left L3 nerve level) without  spinal stenosis. Moderate left L2 foraminal stenosis.     L3-L4: Disc space loss with circumferential disc osteophyte complex,  bulky far laterally to the left. Moderate facet and ligament flavum  hypertrophy primarily on the left. Mild to moderate left lateral  recess stenosis (left L4 nerve level). Mild overall spinal  stenosis. Moderate to severe left L3 foraminal stenosis.     L4-L5: Disc  space loss with circumferential disc osteophyte complex  eccentric to the right. Mild facet hypertrophy primarily on the  right. Mild if any lateral recess stenosis (L5 nerve levels). No  spinal stenosis. Mild bilateral L4 foraminal stenosis.     L5-S1: Mild disc bulging and endplate spurring eccentric to the  right. Mild to moderate facet hypertrophy greater on the right. No  spinal or lateral recess stenosis. Moderate right L5 foraminal  stenosis.     IMPRESSION:  1. Moderate dextroconvex lumbar scoliosis has progressed since 2017  but there is no acute osseous abnormality.     2. Chronic lumbar disc and endplate degeneration with intermittent  posterior element hypertrophy.  Subsequent mild spinal stenosis at L3-L4 with up to severe left  foraminal and moderate left lateral recess stenosis (left L3 and L4  nerve levels, respectively).   Up to moderate left foraminal and left lateral recess stenosis at  L2-L3.  And moderate right foraminal stenosis at L5-S1.        Electronically Signed    By: Genevie Ann M.D.    On: 01/27/2021 10:33  Objective:  VS:  HT:    WT:   BMI:     BP:132/68  HR:76bpm  TEMP: ( )  RESP:  Physical Exam Vitals and nursing note reviewed.  Constitutional:      General: She is not in acute distress.    Appearance: Normal appearance. She is not ill-appearing.  HENT:     Head: Normocephalic and atraumatic.     Right Ear: External ear normal.     Left Ear: External ear normal.  Eyes:     Extraocular Movements: Extraocular movements intact.  Cardiovascular:     Rate and Rhythm: Normal rate.     Pulses: Normal pulses.  Pulmonary:     Effort: Pulmonary effort is normal. No respiratory distress.  Abdominal:     General: There is no distension.     Palpations: Abdomen is soft.  Musculoskeletal:        General: Tenderness present.     Cervical back: Neck supple.     Right lower leg: No edema.     Left lower leg: No edema.     Comments: Patient  has good distal strength with no pain over the greater trochanters.  No clonus or focal weakness.  Skin:    Findings: No erythema, lesion or rash.  Neurological:     General: No focal deficit present.     Mental Status: She is alert and oriented to person, place, and time.     Sensory: No sensory deficit.     Motor: No weakness or abnormal muscle tone.     Coordination: Coordination normal.  Psychiatric:        Mood and Affect: Mood normal.        Behavior: Behavior normal.      Imaging: No results found.

## 2021-09-21 NOTE — Procedures (Signed)
Lumbar Epidural Steroid Injection - Interlaminar Approach with Fluoroscopic Guidance  Patient: Kelsey Lopez      Date of Birth: 1940/08/14 MRN: [id number] PCP: Unk Pinto, MD      Visit Date: 09/10/2021   Universal Protocol:     Consent Given By: the patient  Position: PRONE  Additional Comments: Vital signs were monitored before and after the procedure. Patient was prepped and draped in the usual sterile fashion. The correct patient, procedure, and site was verified.   Injection Procedure Details:   Procedure diagnoses: Lumbar radiculopathy [M54.16]   Meds Administered:  Meds ordered this encounter  Medications   methylPREDNISolone acetate (DEPO-MEDROL) injection 80 mg   traMADol (ULTRAM) 50 MG tablet    Sig: Take 1 tablet (50 mg total) by mouth every 12 (twelve) hours as needed for moderate pain or severe pain.    Dispense:  20 tablet    Refill:  0    Not to exceed 5 additional fills before 07/15/2021     Laterality: Left  Location/Site:  L4-5  Needle: 3.5 in., 20 ga. Tuohy  Needle Placement: Paramedian epidural  Findings:   -Comments: Excellent flow of contrast into the epidural space.  Procedure Details: Using a paramedian approach from the side mentioned above, the region overlying the inferior lamina was localized under fluoroscopic visualization and the soft tissues overlying this structure were infiltrated with 4 ml. of 1% Lidocaine without Epinephrine. The Tuohy needle was inserted into the epidural space using a paramedian approach.   The epidural space was localized using loss of resistance along with counter oblique bi-planar fluoroscopic views.  After negative aspirate for air, blood, and CSF, a 2 ml. volume of Isovue-250 was injected into the epidural space and the flow of contrast was observed. Radiographs were obtained for documentation purposes.    The injectate was administered into the level noted above.   Additional Comments:  The  patient tolerated the procedure well Dressing: 2 x 2 sterile gauze and Band-Aid    Post-procedure details: Patient was observed during the procedure. Post-procedure instructions were reviewed.  Patient left the clinic in stable condition.

## 2021-10-07 DIAGNOSIS — H26491 Other secondary cataract, right eye: Secondary | ICD-10-CM | POA: Diagnosis not present

## 2021-10-07 DIAGNOSIS — H52203 Unspecified astigmatism, bilateral: Secondary | ICD-10-CM | POA: Diagnosis not present

## 2021-10-21 DIAGNOSIS — H26491 Other secondary cataract, right eye: Secondary | ICD-10-CM | POA: Diagnosis not present

## 2021-10-23 ENCOUNTER — Other Ambulatory Visit: Payer: Self-pay | Admitting: Nurse Practitioner

## 2021-10-23 DIAGNOSIS — F419 Anxiety disorder, unspecified: Secondary | ICD-10-CM

## 2021-10-26 ENCOUNTER — Other Ambulatory Visit: Payer: Self-pay | Admitting: Nurse Practitioner

## 2021-10-26 DIAGNOSIS — F419 Anxiety disorder, unspecified: Secondary | ICD-10-CM

## 2021-12-07 ENCOUNTER — Other Ambulatory Visit: Payer: Self-pay

## 2021-12-07 ENCOUNTER — Other Ambulatory Visit: Payer: Self-pay | Admitting: Nurse Practitioner

## 2021-12-07 ENCOUNTER — Other Ambulatory Visit: Payer: Self-pay | Admitting: Internal Medicine

## 2021-12-07 DIAGNOSIS — F419 Anxiety disorder, unspecified: Secondary | ICD-10-CM

## 2022-01-12 ENCOUNTER — Encounter: Payer: Medicare Other | Admitting: Internal Medicine

## 2022-01-19 ENCOUNTER — Encounter: Payer: Medicare Other | Admitting: Internal Medicine

## 2022-01-26 ENCOUNTER — Encounter: Payer: Medicare Other | Admitting: Internal Medicine

## 2022-01-29 ENCOUNTER — Other Ambulatory Visit: Payer: Self-pay | Admitting: Nurse Practitioner

## 2022-01-29 ENCOUNTER — Other Ambulatory Visit: Payer: Self-pay | Admitting: Physical Medicine and Rehabilitation

## 2022-02-01 ENCOUNTER — Encounter: Payer: Self-pay | Admitting: Internal Medicine

## 2022-02-01 NOTE — Patient Instructions (Signed)

## 2022-02-01 NOTE — Progress Notes (Signed)
Annual Screening/Preventative Visit & Comprehensive Evaluation &  Examination   Future Appointments  Date Time Provider Department  02/02/2022                    cpe  3:00 PM Unk Pinto, MD [facility]  05/13/2022                       wellness  4:00 PM Darrol Jump, NP [facility]  02/07/2023                    cpe  3:00 PM Unk Pinto, MD [facility]         This very nice 81 y.o.WWF  presents for a Screening /Preventative Visit & comprehensive evaluation and management of multiple medical co-morbidities.  Patient has been followed for HTN, HLD, Prediabetes  and Vitamin D Deficiency. Patient has hx/o Depression in remission on meds.          HTN predates circa 1996.  Patient  has CKD2  2 0   her HTN . Patient's BP has been controlled at home and patient denies any cardiac symptoms as chest pain, palpitations, shortness of breath, dizziness or ankle swelling. Today's BP is at goal -                        .        Patient's hyperlipidemia is controlled with diet and medications. Patient denies myalgias or other medication SE's. Last lipids were at goal :  Lab Results  Component Value Date   CHOL 174 05/12/2021   HDL 81 05/12/2021   LDLCALC 74 05/12/2021   TRIG 103 05/12/2021   CHOLHDL 2.1 05/12/2021         Patient has hx/o prediabetes (A1c 5.8% /2015) and patient denies reactive hypoglycemic symptoms, visual blurring, diabetic polys or paresthesias. Last A1c was normal & at goal :  Lab Results  Component Value Date   HGBA1C 5.3 01/12/2021         Finally, patient has history of Vitamin D Deficiency and last Vitamin D was near goal :  Lab Results  Component Value Date   VD25OH 70 01/12/2021         Alprazolam 0.5 MG tablet, TAKE 1/2 -1 TABLET AT BEDTIME ONLY IF NEEDED FOR SLEEP    aspirin 81 MG EC tablet, Take daily   cetirizine 10 MG tablet, Take daily  as needed  VITAMIN D 2000 u capsule, Take 2,000 Units daily.   citalopram (CELEXA) 20 MG  tablet, Take  1 tablet  Daily for Mood, Disp: 90 tablet, Rfl: 3   conjugated estrogens  vaginal cream, Place 1 Applicatorful vaginally. Use once every week   Flaxseed, Linseed, (FLAX SEED OIL) 1000 MG CAPS, Take 1,000 mg by mouth daily   losartan-hctz 100-25 MG tablet, Take 1 tablet daily   Magnesium 400 MG CAPS, Take  daily   meloxicam  15 MG tablet, TAKE 1/2 TO 1 TABLET DAILY WITH FOOD    montelukast 10 MG tablet, TAKE 1 TABLET  DAILY FOR ALLERGIES   Multiple Vitamin , Take 1 tablet daily   Calcium 1800 mg 1 tablet daily   Otc Mucinex 1 tab daily   simvastatin 40 MG tablet, Take  1 tablet  at Bedtime    tiZANidine (ZANAFLEX) 4 MG tablet, TAKE 1 TABLET EVERY 8 HRS AS NEEDED    traMADol 50 MG tablet, Take  1 tablet  every 12 hours as needed for moderate pain    traZODone  150 MG tablet, TAKE 1/2 TO 1 TABLET 1 HR BEFORE BEDTIME IF NEEDED     vitamin C  500 MG tablet, Take  daily    Allergies  Allergen Reactions   Ace Inhibitors     Cough   Ampicillin     rash   Mevacor [Lovastatin]     Elevated LFT's        Penicillins     REACTION: hives and  itching   Sulfa Antibiotics     rash     Past Medical History:  Diagnosis Date   Anxiety    Dyslipidemia    on Rx x20 years   HTN (hypertension)    Hyperlipidemia    Insomnia    Morton's neuroma of left foot    Multiple allergies    Osteoarthritis    Polycythemia    PVC (premature ventricular contraction)      Health Maintenance  Topic Date Due   MAMMOGRAM  12/26/2020   TETANUS/TDAP  08/02/2023   Pneumonia Vaccine 87+ Years old  Completed   INFLUENZA VACCINE  Completed   DEXA SCAN  Completed   COVID-19 Vaccine  Completed   Zoster Vaccines- Shingrix  Completed   HPV VACCINES  Aged Out     Immunization History  Administered Date(s) Administered   Influenza Split 11/15/2011, 11/28/2012, 10/31/2013, 01/07/2014   Influenza, High Dose Seasonal PF 10/30/2017, 10/01/2018, 10/23/2019, 10/14/2020   Influenza 09/29/2016,  10/01/2018   PFIZER Covid-19 Tri-Sucrose Vacc 05/12/2020   PFIZER SARS-COV-2 Vacci 03/02/2019, 03/23/2019, 11/20/2019   Pfizer Covid-19 VaccBivalent Booster  12/01/2020   Pneumococcal -13 10/02/2013   Pneumococcal -23 08/03/2017   Tdap 08/01/2013   Zoster Recombinat (Shingrix) 11/10/2016, 01/10/2017   Zoster, Live 05/25/2011     Last Colon - 05/15/2012 - Dr Watt Climes - Recc no f/u due to Age   Last MGM - 06/02/2021   Last dexa BMD - 12/27/2019  - Osteopenia T score -2.3     Past Surgical History:  Procedure Laterality Date   ABDOMINAL HYSTERECTOMY     CATARACT EXTRACTION Right 2019   Dr. Ellie Lunch    KNEE ARTHROSCOPY     R   TONSILLECTOMY     VESICOVAGINAL FISTULA CLOSURE W/ TAH       Family History  Problem Relation Age of Onset   Osteoarthritis Mother    Heart attack Mother    Heart disease Mother    Cancer Father        lung   Osteoarthritis Father    Hyperlipidemia Brother    Diabetes Brother      Social History   Tobacco Use   Smoking status: Never   Smokeless tobacco: Never   Tobacco comments:    no tobacco   Substance Use Topics   Alcohol use: Yes    Comment: rare      ROS Constitutional: Denies fever, chills, weight loss/gain, headaches, insomnia,  night sweats, and change in appetite. Does c/o fatigue. Eyes: Denies redness, blurred vision, diplopia, discharge, itchy, watery eyes.  ENT: Denies discharge, congestion, post nasal drip, epistaxis, sore throat, earache, hearing loss, dental pain, Tinnitus, Vertigo, Sinus pain, snoring.  Cardio: Denies chest pain, palpitations, irregular heartbeat, syncope, dyspnea, diaphoresis, orthopnea, PND, claudication, edema Respiratory: denies cough, dyspnea, DOE, pleurisy, hoarseness, laryngitis, wheezing.  Gastrointestinal: Denies dysphagia, heartburn, reflux, water brash, pain, cramps, nausea, vomiting, bloating, diarrhea, constipation, hematemesis, melena, hematochezia, jaundice, hemorrhoids  Genitourinary:  Denies dysuria, frequency, urgency, nocturia, hesitancy, discharge, hematuria, flank pain Breast: Breast lumps, nipple discharge, bleeding.  Musculoskeletal: Denies arthralgia, myalgia, stiffness, Jt. Swelling, pain, limp, and strain/sprain. Denies falls. Skin: Denies puritis, rash, hives, warts, acne, eczema, changing in skin lesion Neuro: No weakness, tremor, incoordination, spasms, paresthesia, pain Psychiatric: Denies confusion, memory loss, sensory loss. Denies Depression. Endocrine: Denies change in weight, skin, hair change, nocturia, and paresthesia, diabetic polys, visual blurring, hyper / hypo glycemic episodes.  Heme/Lymph: No excessive bleeding, bruising, enlarged lymph nodes.  Physical Exam  There were no vitals taken for this visit.  General Appearance: Well nourished, well groomed and in no apparent distress.  Eyes: PERRLA, EOMs, conjunctiva no swelling or erythema, normal fundi and vessels. Sinuses: No frontal/maxillary tenderness ENT/Mouth: EACs patent / TMs  nl. Nares clear without erythema, swelling, mucoid exudates. Oral hygiene is good. No erythema, swelling, or exudate. Tongue normal, non-obstructing. Tonsils not swollen or erythematous. Hearing normal.  Neck: Supple, thyroid not palpable. No bruits, nodes or JVD. Respiratory: Respiratory effort normal.  BS equal and clear bilateral without rales, rhonci, wheezing or stridor. Cardio: Heart sounds are normal with regular rate and rhythm and no murmurs, rubs or gallops. Peripheral pulses are normal and equal bilaterally without edema. No aortic or femoral bruits. Chest: symmetric with normal excursions and percussion. Breasts: Symmetric, without lumps, nipple discharge, retractions, or fibrocystic changes.  Abdomen: Flat, soft with bowel sounds active. Nontender, no guarding, rebound, hernias, masses, or organomegaly.  Lymphatics: Non tender without lymphadenopathy.  Musculoskeletal: Full ROM all peripheral extremities,  joint stability, 5/5 strength, and normal gait. Skin: Warm and dry without rashes, lesions, cyanosis, clubbing or  ecchymosis.  Neuro: Cranial nerves intact, reflexes equal bilaterally. Normal muscle tone, no cerebellar symptoms. Sensation intact.  Pysch: Alert and oriented X 3, normal affect, Insight and Judgment appropriate.    Assessment and Plan  1. Annual Preventative Screening Examination   2. Essential hypertension  - EKG 12-Lead - Urinalysis, Routine w reflex microscopic - Microalbumin / creatinine urine ratio - CBC with Differential/Platelet - COMPLETE METABOLIC PANEL WITH GFR - Magnesium - TSH  3. Hyperlipidemia, mixed  - EKG 12-Lead - Lipid panel - TSH  4. Abnormal glucose  - EKG 12-Lead - Hemoglobin A1c - Insulin, random  5. Vitamin D deficiency  - VITAMIN D 25 Hydroxy   6. Depression, major, in remission ([place])  - TSH  7. Osteopenia of multiple sites  - COMPLETE METABOLIC PANEL WITH GFR - TSH - VITAMIN D 25 Hydroxy   8. Stage 2 chronic kidney disease  - Urinalysis, Routine w reflex microscopic - Microalbumin / creatinine urine ratio  9. Screening for heart disease  - EKG 12-Lead  10. FHx: heart disease  - EKG 12-Lead  11. Medication management  - Urinalysis, Routine w reflex microscopic - Microalbumin / creatinine urine ratio - CBC with Differential/Platelet - COMPLETE METABOLIC PANEL WITH GFR - Magnesium - Lipid panel - TSH - Hemoglobin A1c - Insulin, random - VITAMIN D 25 Hydroxy   12. Screening for colorectal cancer  - POC Hemoccult Bld/Stl            Patient was counseled in prudent diet to achieve/maintain BMI less than 25 for weight control, BP monitoring, regular exercise and medications. Discussed med's effects and SE's. Screening labs and tests as requested with regular follow-up as recommended. Over 40 minutes of exam, counseling, chart review and high complex critical decision making was performed.   Kirtland Bouchard, MD

## 2022-02-02 ENCOUNTER — Ambulatory Visit (INDEPENDENT_AMBULATORY_CARE_PROVIDER_SITE_OTHER): Payer: Medicare Other | Admitting: Internal Medicine

## 2022-02-02 ENCOUNTER — Encounter: Payer: Self-pay | Admitting: Internal Medicine

## 2022-02-02 VITALS — BP 112/62 | HR 111 | Temp 97.3°F | Resp 16 | Ht 65.0 in | Wt 155.2 lb

## 2022-02-02 DIAGNOSIS — Z1211 Encounter for screening for malignant neoplasm of colon: Secondary | ICD-10-CM

## 2022-02-02 DIAGNOSIS — F325 Major depressive disorder, single episode, in full remission: Secondary | ICD-10-CM

## 2022-02-02 DIAGNOSIS — Z0000 Encounter for general adult medical examination without abnormal findings: Secondary | ICD-10-CM | POA: Diagnosis not present

## 2022-02-02 DIAGNOSIS — E559 Vitamin D deficiency, unspecified: Secondary | ICD-10-CM

## 2022-02-02 DIAGNOSIS — R7309 Other abnormal glucose: Secondary | ICD-10-CM

## 2022-02-02 DIAGNOSIS — I10 Essential (primary) hypertension: Secondary | ICD-10-CM | POA: Diagnosis not present

## 2022-02-02 DIAGNOSIS — N182 Chronic kidney disease, stage 2 (mild): Secondary | ICD-10-CM

## 2022-02-02 DIAGNOSIS — I4719 Other supraventricular tachycardia: Secondary | ICD-10-CM

## 2022-02-02 DIAGNOSIS — M8589 Other specified disorders of bone density and structure, multiple sites: Secondary | ICD-10-CM

## 2022-02-02 DIAGNOSIS — Z8249 Family history of ischemic heart disease and other diseases of the circulatory system: Secondary | ICD-10-CM

## 2022-02-02 DIAGNOSIS — Z79899 Other long term (current) drug therapy: Secondary | ICD-10-CM

## 2022-02-02 DIAGNOSIS — Z136 Encounter for screening for cardiovascular disorders: Secondary | ICD-10-CM

## 2022-02-02 DIAGNOSIS — E782 Mixed hyperlipidemia: Secondary | ICD-10-CM

## 2022-02-02 DIAGNOSIS — Z0001 Encounter for general adult medical examination with abnormal findings: Secondary | ICD-10-CM

## 2022-02-02 MED ORDER — DILTIAZEM HCL ER COATED BEADS 240 MG PO CP24: ORAL_CAPSULE | ORAL | 11 refills | Status: DC

## 2022-02-02 NOTE — Progress Notes (Signed)
Annual Screening/Preventative Visit & Comprehensive Evaluation &  Examination   Future Appointments  Date Time Provider Department  02/02/2022                    cpe  3:00 PM Unk Pinto, MD [facility]  05/13/2022                       wellness  4:00 PM Darrol Jump, NP [facility]  02/07/2023                    cpe  3:00 PM Unk Pinto, MD [facility]         This very nice 81 y.o.WWF  presents for a Screening /Preventative Visit & comprehensive evaluation and management of multiple medical co-morbidities.  Patient has been followed for HTN, HLD, Prediabetes  and Vitamin D Deficiency. Patient has hx/o Depression in remission on meds.          HTN predates circa 1996.  Patient  has CKD2  2 0   her HTN . Patient's BP has been controlled at home and patient denies any cardiac symptoms as chest pain, palpitations, shortness of breath, dizziness or ankle swelling. Today's BP is at goal - 112/62 .Today's EKG is suspect for PAT with variable block. She relates that she was seen about 12 years ago by Dr Dorris Carnes for ? PVC's.        Patient's hyperlipidemia is controlled with diet and medications. Patient denies myalgias or other medication SE's. Last lipids were at goal :  Lab Results  Component Value Date   CHOL 174 05/12/2021   HDL 81 05/12/2021   LDLCALC 74 05/12/2021   TRIG 103 05/12/2021   CHOLHDL 2.1 05/12/2021         Patient has hx/o prediabetes (A1c 5.8% /2015) and patient denies reactive hypoglycemic symptoms, visual blurring, diabetic polys or paresthesias. Last A1c was normal & at goal :  Lab Results  Component Value Date   HGBA1C 5.3 01/12/2021         Finally, patient has history of Vitamin D Deficiency and last Vitamin D was near goal :  Lab Results  Component Value Date   VD25OH 70 01/12/2021         Alprazolam 0.5 MG tablet, TAKE 1/2 -1 TABLET AT BEDTIME ONLY IF NEEDED FOR SLEEP    aspirin 81 MG EC tablet, Take daily   cetirizine 10 MG  tablet, Take daily  as needed  VITAMIN D 2000 u capsule, Take 2,000 Units daily.   citalopram (CELEXA) 20 MG tablet, Take  1 tablet  Daily for Mood, Disp: 90 tablet, Rfl: 3   conjugated estrogens  vaginal cream, Place 1 Applicatorful vaginally. Use once every week   Flaxseed, Linseed, (FLAX SEED OIL) 1000 MG CAPS, Take 1,000 mg by mouth daily   losartan-hctz 100-25 MG tablet, Take 1 tablet daily   Magnesium 400 MG CAPS, Take  daily   meloxicam  15 MG tablet, TAKE 1/2 TO 1 TABLET DAILY WITH FOOD    montelukast 10 MG tablet, TAKE 1 TABLET  DAILY FOR ALLERGIES   Multiple Vitamin , Take 1 tablet daily   Calcium 1800 mg 1 tablet daily   Otc Mucinex 1 tab daily   simvastatin 40 MG tablet, Take  1 tablet  at Bedtime    tiZANidine (ZANAFLEX) 4 MG tablet, TAKE 1 TABLET EVERY 8 HRS AS NEEDED    traMADol 50  MG tablet, Take 1 tablet  every 12 hours as needed for moderate pain    traZODone  150 MG tablet, TAKE 1/2 TO 1 TABLET 1 HR BEFORE BEDTIME IF NEEDED     vitamin C  500 MG tablet, Take  daily    Allergies  Allergen Reactions   Ace Inhibitors     Cough   Ampicillin     rash   Mevacor [Lovastatin]     Elevated LFT's        Penicillins     REACTION: hives and  itching   Sulfa Antibiotics     rash     Past Medical History:  Diagnosis Date   Anxiety    Dyslipidemia    on Rx x20 years   HTN (hypertension)    Hyperlipidemia    Insomnia    Morton's neuroma of left foot    Multiple allergies    Osteoarthritis    Polycythemia    PVC (premature ventricular contraction)      Health Maintenance  Topic Date Due   MAMMOGRAM  12/26/2020   TETANUS/TDAP  08/02/2023   Pneumonia Vaccine 73+ Years old  Completed   INFLUENZA VACCINE  Completed   DEXA SCAN  Completed   COVID-19 Vaccine  Completed   Zoster Vaccines- Shingrix  Completed   HPV VACCINES  Aged Out     Immunization History  Administered Date(s) Administered   Influenza Split 11/15/2011, 11/28/2012, 10/31/2013, 01/07/2014    Influenza, High Dose Seasonal PF 10/30/2017, 10/01/2018, 10/23/2019, 10/14/2020   Influenza 09/29/2016, 10/01/2018   PFIZER Covid-19 Tri-Sucrose Vacc 05/12/2020   PFIZER SARS-COV-2 Vacci 03/02/2019, 03/23/2019, 11/20/2019   Pfizer Covid-19 VaccBivalent Booster  12/01/2020   Pneumococcal -13 10/02/2013   Pneumococcal -23 08/03/2017   Tdap 08/01/2013   Zoster Recombinat (Shingrix) 11/10/2016, 01/10/2017   Zoster, Live 05/25/2011     Last Colon - 05/15/2012 - Dr Watt Climes - Recc no f/u due to Age   Last MGM - 06/02/2021   Last dexa BMD - 12/27/2019  - Osteopenia T score -2.3     Past Surgical History:  Procedure Laterality Date   ABDOMINAL HYSTERECTOMY     CATARACT EXTRACTION Right 2019   Dr. Ellie Lunch    KNEE ARTHROSCOPY     R   TONSILLECTOMY     VESICOVAGINAL FISTULA CLOSURE W/ TAH       Family History  Problem Relation Age of Onset   Osteoarthritis Mother    Heart attack Mother    Heart disease Mother    Cancer Father        lung   Osteoarthritis Father    Hyperlipidemia Brother    Diabetes Brother      Social History   Tobacco Use   Smoking status: Never   Smokeless tobacco: Never   Tobacco comments:    no tobacco   Substance Use Topics   Alcohol use: Yes    Comment: rare      ROS Constitutional: Denies fever, chills, weight loss/gain, headaches, insomnia,  night sweats, and change in appetite. Does c/o fatigue. Eyes: Denies redness, blurred vision, diplopia, discharge, itchy, watery eyes.  ENT: Denies discharge, congestion, post nasal drip, epistaxis, sore throat, earache, hearing loss, dental pain, Tinnitus, Vertigo, Sinus pain, snoring.  Cardio: Denies chest pain, palpitations, irregular heartbeat, syncope, dyspnea, diaphoresis, orthopnea, PND, claudication, edema Respiratory: denies cough, dyspnea, DOE, pleurisy, hoarseness, laryngitis, wheezing.  Gastrointestinal: Denies dysphagia, heartburn, reflux, water brash, pain, cramps, nausea, vomiting,  bloating, diarrhea, constipation, hematemesis,  melena, hematochezia, jaundice, hemorrhoids Genitourinary: Denies dysuria, frequency, urgency, nocturia, hesitancy, discharge, hematuria, flank pain Breast: Breast lumps, nipple discharge, bleeding.  Musculoskeletal: Denies arthralgia, myalgia, stiffness, Jt. Swelling, pain, limp, and strain/sprain. Denies falls. Skin: Denies puritis, rash, hives, warts, acne, eczema, changing in skin lesion Neuro: No weakness, tremor, incoordination, spasms, paresthesia, pain Psychiatric: Denies confusion, memory loss, sensory loss. Denies Depression. Endocrine: Denies change in weight, skin, hair change, nocturia, and paresthesia, diabetic polys, visual blurring, hyper / hypo glycemic episodes.  Heme/Lymph: No excessive bleeding, bruising, enlarged lymph nodes.  Physical Exam  BP 112/62   Pulse (!) 111   Temp (!) 97.3 F (36.3 C)   Resp 16   Ht '5\' 5"'$  (1.651 m)   Wt 155 lb 3.2 oz (70.4 kg)   SpO2 97%   BMI 25.83 kg/m   General Appearance: Well nourished, well groomed and in no apparent distress.  Eyes: PERRLA, EOMs, conjunctiva no swelling or erythema, normal fundi and vessels. Sinuses: No frontal/maxillary tenderness ENT/Mouth: EACs patent / TMs  nl. Nares clear without erythema, swelling, mucoid exudates. Oral hygiene is good. No erythema, swelling, or exudate. Tongue normal, non-obstructing. Tonsils not swollen or erythematous. Hearing normal.  Neck: Supple, thyroid not palpable. No bruits, nodes or JVD. Respiratory: Respiratory effort normal.  BS equal and clear bilateral without rales, rhonci, wheezing or stridor. Cardio: Heart sounds are soft with irregular rate and rhythm and no murmurs, rubs or gallops. Peripheral pulses are normal and equal bilaterally without edema. No aortic or femoral bruits. Chest: symmetric with normal excursions and percussion. Breasts: Deferred to upcoming MGM. Abdomen: Flat, soft with bowel sounds active. Nontender, no  guarding, rebound, hernias, masses, or organomegaly.  Lymphatics: Non tender without lymphadenopathy.  Musculoskeletal: Full ROM all peripheral extremities, joint stability, 5/5 strength, and normal gait. Skin: Warm and dry without rashes, lesions, cyanosis, clubbing or  ecchymosis.  Neuro: Cranial nerves intact, reflexes equal bilaterally. Normal muscle tone, no cerebellar symptoms. Sensation intact.  Pysch: Alert and oriented X 3, normal affect, Insight and Judgment appropriate.    Assessment and Plan  1. Annual Preventative Screening Examination   2. Essential hypertension  - EKG 12-Lead - ? PAT with variable block   - Will hold her Losartan & start on Diltiazem & have patient return in 1 week to repeat EKG .    ( Patient given strict ER Precautions for ANY cardiac type sx's )  3. Hyperlipidemia, mixed  - EKG 12-Lead - Lipid panel - TSH   4. Abnormal glucose  - EKG 12-Lead - Hemoglobin A1c - Insulin, random   5. Vitamin D deficiency  - EKG 12-Lead - VITAMIN D 25 Hydroxy    6. Depression, major, in remission ([place])  - TSH   7. Osteopenia of multiple sites  - COMPLETE METABOLIC PANEL WITH GFR   8. Stage 2 chronic kidney disease  - Urinalysis, Routine w reflex microscopic - Microalbumin / creatinine urine ratio - COMPLETE METABOLIC PANEL WITH GFR   9. Screening for colorectal cancer  - POC Hemoccult Bld/Stl    10. Screening for heart disease  - EKG 12-Lead   11. FHx: heart disease  - EKG 12-Lead   12. Medication management  - Urinalysis, Routine w reflex microscopic - Microalbumin / creatinine urine ratio - CBC with Differential/Platelet - COMPLETE METABOLIC PANEL WITH GFR - Magnesium - Lipid panel - TSH - Hemoglobin A1c - Insulin, random - VITAMIN D 25 Hydroxy  Patient was counseled in prudent diet to achieve/maintain BMI less than 25 for weight control, BP monitoring, regular exercise and medications. Discussed med's  effects and SE's. Screening labs and tests as requested with regular follow-up as recommended. Over 40 minutes of exam, counseling, chart review and high complex critical decision making was performed.   Kirtland Bouchard, MD

## 2022-02-03 LAB — CBC WITH DIFFERENTIAL/PLATELET
Absolute Monocytes: 632 cells/uL (ref 200–950)
Basophils Absolute: 49 cells/uL (ref 0–200)
Basophils Relative: 0.6 %
Eosinophils Absolute: 138 cells/uL (ref 15–500)
Eosinophils Relative: 1.7 %
HCT: 39.9 % (ref 35.0–45.0)
Hemoglobin: 13.9 g/dL (ref 11.7–15.5)
Lymphs Abs: 1661 cells/uL (ref 850–3900)
MCH: 33.1 pg — ABNORMAL HIGH (ref 27.0–33.0)
MCHC: 34.8 g/dL (ref 32.0–36.0)
MCV: 95 fL (ref 80.0–100.0)
MPV: 9.5 fL (ref 7.5–12.5)
Monocytes Relative: 7.8 %
Neutro Abs: 5621 cells/uL (ref 1500–7800)
Neutrophils Relative %: 69.4 %
Platelets: 280 10*3/uL (ref 140–400)
RBC: 4.2 10*6/uL (ref 3.80–5.10)
RDW: 12.4 % (ref 11.0–15.0)
Total Lymphocyte: 20.5 %
WBC: 8.1 10*3/uL (ref 3.8–10.8)

## 2022-02-03 LAB — COMPLETE METABOLIC PANEL WITH GFR
AG Ratio: 1.8 (calc) (ref 1.0–2.5)
ALT: 22 U/L (ref 6–29)
AST: 23 U/L (ref 10–35)
Albumin: 4.2 g/dL (ref 3.6–5.1)
Alkaline phosphatase (APISO): 90 U/L (ref 37–153)
BUN: 14 mg/dL (ref 7–25)
CO2: 31 mmol/L (ref 20–32)
Calcium: 10.3 mg/dL (ref 8.6–10.4)
Chloride: 97 mmol/L — ABNORMAL LOW (ref 98–110)
Creat: 0.78 mg/dL (ref 0.60–0.95)
Globulin: 2.4 g/dL (calc) (ref 1.9–3.7)
Glucose, Bld: 80 mg/dL (ref 65–99)
Potassium: 3.6 mmol/L (ref 3.5–5.3)
Sodium: 139 mmol/L (ref 135–146)
Total Bilirubin: 0.5 mg/dL (ref 0.2–1.2)
Total Protein: 6.6 g/dL (ref 6.1–8.1)
eGFR: 76 mL/min/{1.73_m2} (ref >=60)

## 2022-02-03 LAB — URINALYSIS, ROUTINE W REFLEX MICROSCOPIC
Bacteria, UA: NONE SEEN /HPF
Bilirubin Urine: NEGATIVE
Glucose, UA: NEGATIVE
Hgb urine dipstick: NEGATIVE
Hyaline Cast: NONE SEEN /LPF
Ketones, ur: NEGATIVE
Nitrite: NEGATIVE
Protein, ur: NEGATIVE
RBC / HPF: NONE SEEN /HPF (ref 0–2)
Specific Gravity, Urine: 1.01 (ref 1.001–1.035)
WBC, UA: NONE SEEN /HPF (ref 0–5)
pH: 7.5 (ref 5.0–8.0)

## 2022-02-03 LAB — LIPID PANEL
Cholesterol: 186 mg/dL (ref <200)
HDL: 75 mg/dL (ref >=50)
LDL Cholesterol (Calc): 84 mg/dL (calc)
Non-HDL Cholesterol (Calc): 111 mg/dL (calc) (ref <130)
Total CHOL/HDL Ratio: 2.5 (calc) (ref <5.0)
Triglycerides: 167 mg/dL — ABNORMAL HIGH (ref <150)

## 2022-02-03 LAB — VITAMIN D 25 HYDROXY (VIT D DEFICIENCY, FRACTURES): Vit D, 25-Hydroxy: 123 ng/mL — ABNORMAL HIGH (ref 30–100)

## 2022-02-03 LAB — MICROALBUMIN / CREATININE URINE RATIO
Creatinine, Urine: 37 mg/dL (ref 20–275)
Microalb Creat Ratio: 5 mcg/mg creat (ref <30)
Microalb, Ur: 0.2 mg/dL

## 2022-02-03 LAB — MAGNESIUM: Magnesium: 1.9 mg/dL (ref 1.5–2.5)

## 2022-02-03 LAB — HEMOGLOBIN A1C
Hgb A1c MFr Bld: 5.5 % of total Hgb (ref <5.7)
Mean Plasma Glucose: 111 mg/dL
eAG (mmol/L): 6.2 mmol/L

## 2022-02-03 LAB — TSH: TSH: 2.24 mIU/L (ref 0.40–4.50)

## 2022-02-03 LAB — MICROSCOPIC MESSAGE

## 2022-02-03 LAB — INSULIN, RANDOM: Insulin: 15.5 u[IU]/mL

## 2022-02-03 NOTE — Progress Notes (Signed)
<><><><><><><><><><><><><><><><><><><><><><><><><><><><><><><><><> <><><><><><><><><><><><><><><><><><><><><><><><><><><><><><><><><> -   Test results slightly outside the reference range are not unusual. If there is anything important, I will review this with you,  otherwise it is considered normal test values.  If you have further questions,  please do not hesitate to contact me at the office or via My Chart.  <><><><><><><><><><><><><><><><><><><><><><><><><><><><><><><><><> <><><><><><><><><><><><><><><><><><><><><><><><><><><><><><><><><>  -  U/A - is OK - No sign of Urinary Tract Infection <><><><><><><><><><><><><><><><><><><><><><><><><><><><><><><><><>  -  Total Chol = 186  & LDL Chol 84   - Both  . Excellent   -   Very low risk for Heart Attack  / Stroke <><><><><><><><><><><><><><><><><><><><><><><><><><><><><><><><><>  -  Vitamin D = 123 is too high  !  -  Please confirm the dose of Vitamin D that you are taking & call the office     Since I can't imagine that if you are only taking 2,000 unit  / day that could be correct  !  <><><><><><><><><><><><><><><><><><><><><><><><><><><><><><><><><>  -  A1c - Normal  - No Diabetes  - Great !  <><><><><><><><><><><><><><><><><><><><><><><><><><><><><><><><><>  - All Else - CBC - Kidneys - Electrolytes - Liver - Magnesium & Thyroid    - all  Normal / OK <><><><><><><><><><><><><><><><><><><><><><><><><><><><><><><><><> <><><><><><><><><><><><><><><><><><><><><><><><><><><><><><><><><>

## 2022-02-08 NOTE — Progress Notes (Unsigned)
Future Appointments  Date Time Provider Department  02/09/2022 10:30 AM Unk Pinto, MD [facility]  05/13/2022                 wellness  4:00 PM Darrol Jump, NP [facility]  08/16/2022                 6 mon ov  2:30 PM Unk Pinto, MD [facility]  11/17/2022                9 mon ov  10:30 AM Darrol Jump, NP [facility]  02/21/2023                cpe 10:00 AM Unk Pinto, MD [facility]    History of Present Illness:       This very nice 82 y.o.WWF  with  HTN, HLD, Prediabetes  and Vitamin D Deficiency returns for 1 week f/u from CPE with EKG showing suspected PAT with variable block and her Losartan was switched to Diltiazem.         Medications   Current Outpatient Medications (Cardiovascular):    diltiazem (CARTIA XT) 240 MG 24 hr capsule, Take 1 tablet Daily for  Heart Rhythm   simvastatin (ZOCOR) 40 MG tablet, Take  1 tablet  at Bedtime  for Cholesterol                                                   /                                    TAKE                    BY                       MOUTH  Current Outpatient Medications (Respiratory):    cetirizine (ZYRTEC) 10 MG tablet, Take 10 mg by mouth as needed.   montelukast (SINGULAIR) 10 MG tablet, TAKE 1 TABLET BY MOUTH DAILY FOR ALLERGIES  Current Outpatient Medications (Analgesics):    aspirin 81 MG EC tablet, Take 81 mg by mouth daily.   meloxicam (MOBIC) 15 MG tablet, TAKE 1/2 TO 1 TABLET DAILY WITH FOOD FOR PAIN & INFLAMMATION   Current Outpatient Medications (Other):    ALPRAZolam (XANAX) 0.5 MG tablet, TAKE 1/2 -1 TABLET AT BEDTIME ONLY IF NEEDED FOR SLEEP & LIMIT TO 5 DAYS /WEEK TO AVOID ADDICTION & DEMENTIA   Cholecalciferol (VITAMIN D3) 2000 UNITS capsule, Take 2,000 Units by mouth daily.   citalopram (CELEXA) 20 MG tablet, Take  1 tablet  Daily for Mood                                                   /                                       TAKE  BY                       MOUTH   Flaxseed, Linseed, (FLAX SEED OIL) 1000 MG CAPS, Take 1,000 mg by mouth daily.   Magnesium 400 MG CAPS, Take 400 mg by mouth daily.   Multiple Vitamin (MULTIVITAMIN) tablet, Take 1 tablet by mouth daily.   NON FORMULARY, Calcium 1800 mg 1 tablet daily   OVER THE COUNTER MEDICATION, Otc Mucinex 1 tab daily   tiZANidine (ZANAFLEX) 4 MG tablet, TAKE 1 TABLET (4 MG TOTAL) BY MOUTH EVERY 8 (EIGHT) HOURS AS NEEDED FOR MUSCLE SPASMS   traZODone (DESYREL) 150 MG tablet, TAKE 1/2 TO 1 TABLET 1 HOUR BEFORE BEDTIME IF NEEDED FOR SLEEP   vitamin C (ASCORBIC ACID) 500 MG tablet, Take 500 mg by mouth daily.  Problem list She has Hyperlipidemia, mixed; Essential hypertension; Anxiety; Osteoarthritis; Abnormal glucose; Medication management; Vitamin D deficiency; Overweight (BMI 25.0-29.9); Insomnia; Depression, major, in remission ([place]); CKD (chronic kidney disease) stage 2, GFR 60-89 ml/min; Unilateral primary osteoarthritis, left knee; Unilateral primary osteoarthritis, right knee; Osteopenia; SVT (supraventricular tachycardia); High risk medication use; and Elevated LFTs on their problem list.   Observations/Objective:  There were no vitals taken for this visit.  HEENT - WNL. Neck - supple.  Chest - Clear equal BS. Cor - Nl HS. RRR w/o sig MGR. PP 1(+). No edema. MS- FROM w/o deformities.  Gait Nl. Neuro -  Nl w/o focal abnormalities.   Assessment and Plan:      Follow Up Instructions:        I discussed the assessment and treatment plan with the patient. The patient was provided an opportunity to ask questions and all were answered. The patient agreed with the plan and demonstrated an understanding of the instructions.       The patient was advised to call back or seek an in-person evaluation if the symptoms worsen or if the condition fails to improve as anticipated.    Kirtland Bouchard, MD

## 2022-02-09 ENCOUNTER — Ambulatory Visit (INDEPENDENT_AMBULATORY_CARE_PROVIDER_SITE_OTHER): Payer: Medicare Other | Admitting: Internal Medicine

## 2022-02-09 ENCOUNTER — Encounter: Payer: Self-pay | Admitting: Internal Medicine

## 2022-02-09 ENCOUNTER — Telehealth: Payer: Self-pay

## 2022-02-09 VITALS — BP 110/68 | HR 96 | Temp 97.9°F | Resp 16 | Ht 65.0 in | Wt 161.4 lb

## 2022-02-09 DIAGNOSIS — I4819 Other persistent atrial fibrillation: Secondary | ICD-10-CM | POA: Diagnosis not present

## 2022-02-09 DIAGNOSIS — I10 Essential (primary) hypertension: Secondary | ICD-10-CM

## 2022-02-09 DIAGNOSIS — I498 Other specified cardiac arrhythmias: Secondary | ICD-10-CM

## 2022-02-09 MED ORDER — APIXABAN 5 MG PO TABS: ORAL_TABLET | ORAL | 1 refills | Status: DC

## 2022-02-09 NOTE — Telephone Encounter (Signed)
Patient informed and reminded to please stop taking aspirin and meloxicam as she is now taking Eliquis and per Dr. Melford Aase those medications increase blood thinning. The patient is aware and voiced clear understanding to this staff member.

## 2022-02-09 NOTE — Patient Instructions (Signed)
Atrial Fibrillation  Atrial fibrillation is a type of irregular or rapid heartbeat (arrhythmia). In atrial fibrillation, the top part of the heart (atria) beats in an irregular pattern. This makes the heart unable to pump blood normally and effectively. The goal of treatment is to prevent blood clots from forming, control your heart rate, or restore your heartbeat to a normal rhythm. If this condition is not treated, it can cause serious problems, such as a weakened heart muscle (cardiomyopathy) or a stroke. What are the causes? This condition is often caused by medical conditions that damage the heart's electrical system. These include: High blood pressure (hypertension). This is the most common cause. Certain heart problems or conditions, such as heart failure, coronary artery disease, heart valve problems, or heart surgery. Diabetes. Overactive thyroid (hyperthyroidism). Obesity. Chronic kidney disease. In some cases, the cause of this condition is not known. What increases the risk? This condition is more likely to develop in: Older people. People who smoke. Athletes who do endurance exercise. People who have a family history of atrial fibrillation. Men. People who use drugs. People who drink a lot of alcohol. People who have lung conditions, such as emphysema, pneumonia, or COPD. People who have obstructive sleep apnea. What are the signs or symptoms? Symptoms of this condition include: A feeling that your heart is racing or beating irregularly. Discomfort or pain in your chest. Shortness of breath. Sudden light-headedness or weakness. Tiring easily during exercise or activity. Fatigue. Syncope (fainting). Sweating. In some cases, there are no symptoms. How is this diagnosed? Your health care provider may detect atrial fibrillation when taking your pulse. If detected, this condition may be diagnosed with: An electrocardiogram (ECG) to check electrical signals of the  heart. An ambulatory cardiac monitor to record your heart's activity for a few days. A transthoracic echocardiogram (TTE) to create pictures of your heart. A transesophageal echocardiogram (TEE) to create even closer pictures of your heart. A stress test to check your blood supply while you exercise. Imaging tests, such as a CT scan or chest X-ray. Blood tests. How is this treated? Treatment depends on underlying conditions and how you feel when you experience atrial fibrillation. This condition may be treated with: Medicines to prevent blood clots or to treat heart rate or heart rhythm problems. Electrical cardioversion to reset the heart's rhythm. A pacemaker to correct abnormal heart rhythm. Ablation to remove the heart tissue that sends abnormal signals. Left atrial appendage closure to seal the area where blood clots can form. In some cases, underlying conditions will be treated. Follow these instructions at home: Medicines Take over-the counter and prescription medicines only as told by your health care provider. Do not take any new medicines without talking to your health care provider. If you are taking blood thinners: Talk with your health care provider before you take any medicines that contain aspirin or NSAIDs, such as ibuprofen. These medicines increase your risk for dangerous bleeding. Take your medicine exactly as told, at the same time every day. Avoid activities that could cause injury or bruising, and follow instructions about how to prevent falls. Wear a medical alert bracelet or carry a card that lists what medicines you take. Lifestyle     Do not use any products that contain nicotine or tobacco, such as cigarettes, e-cigarettes, and chewing tobacco. If you need help quitting, ask your health care provider. Eat heart-healthy foods. Talk with a dietitian to make an eating plan that is right for you. Exercise regularly as told  by your health care provider. Do not  drink alcohol. Lose weight if you are overweight. Do not use drugs, including cannabis. General instructions If you have obstructive sleep apnea, manage your condition as told by your health care provider. Do not use diet pills unless your health care provider approves. Diet pills can make heart problems worse. Keep all follow-up visits as told by your health care provider. This is important. Contact a health care provider if you: Notice a change in the rate, rhythm, or strength of your heartbeat. Are taking a blood thinner and you notice more bruising. Tire more easily when you exercise or do heavy work. Have a sudden change in weight. Get help right away if you have:  Chest pain, abdominal pain, sweating, or weakness. Trouble breathing. Side effects of blood thinners, such as blood in your vomit, stool, or urine, or bleeding that cannot stop. Any symptoms of a stroke. "BE FAST" is an easy way to remember the main warning signs of a stroke: B - Balance. Signs are dizziness, sudden trouble walking, or loss of balance. E - Eyes. Signs are trouble seeing or a sudden change in vision. F - Face. Signs are sudden weakness or numbness of the face, or the face or eyelid drooping on one side. A - Arms. Signs are weakness or numbness in an arm. This happens suddenly and usually on one side of the body. S - Speech. Signs are sudden trouble speaking, slurred speech, or trouble understanding what people say. T - Time. Time to call emergency services. Write down what time symptoms started. Other signs of a stroke, such as: A sudden, severe headache with no known cause. Nausea or vomiting. Seizure. These symptoms may represent a serious problem that is an emergency. Do not wait to see if the symptoms will go away. Get medical help right away. Call your local emergency services (911 in the [place] Do not drive yourself to the hospital. Summary Atrial fibrillation is a type of irregular or rapid  heartbeat (arrhythmia). Symptoms include a feeling that your heart is beating fast or irregularly. You may be given medicines to prevent blood clots or to treat heart rate or heart rhythm problems. Get help right away if you have signs or symptoms of a stroke. Get help right away if you cannot catch your breath or have chest pain or pressure. This information is not intended to replace advice given to you by your health care provider. Make sure you discuss any questions you have with your health care provider. Document Revised: 07/19/2018 Document Reviewed: 07/19/2018 Elsevier Patient Education  [employer].

## 2022-03-04 DIAGNOSIS — L821 Other seborrheic keratosis: Secondary | ICD-10-CM | POA: Diagnosis not present

## 2022-03-04 DIAGNOSIS — L570 Actinic keratosis: Secondary | ICD-10-CM | POA: Diagnosis not present

## 2022-03-04 DIAGNOSIS — L814 Other melanin hyperpigmentation: Secondary | ICD-10-CM | POA: Diagnosis not present

## 2022-03-04 DIAGNOSIS — B078 Other viral warts: Secondary | ICD-10-CM | POA: Diagnosis not present

## 2022-03-06 ENCOUNTER — Other Ambulatory Visit: Payer: Self-pay | Admitting: Physical Medicine and Rehabilitation

## 2022-03-08 ENCOUNTER — Ambulatory Visit: Payer: Medicare Other | Attending: Internal Medicine | Admitting: Internal Medicine

## 2022-03-08 ENCOUNTER — Telehealth: Payer: Self-pay

## 2022-03-08 ENCOUNTER — Encounter: Payer: Self-pay | Admitting: Internal Medicine

## 2022-03-08 VITALS — BP 120/90 | HR 109 | Ht 60.5 in | Wt 158.6 lb

## 2022-03-08 DIAGNOSIS — I4891 Unspecified atrial fibrillation: Secondary | ICD-10-CM

## 2022-03-08 DIAGNOSIS — Z01810 Encounter for preprocedural cardiovascular examination: Secondary | ICD-10-CM | POA: Diagnosis not present

## 2022-03-08 NOTE — Telephone Encounter (Signed)
Patient informed that our clinic shows no record of prescribing Tramadol for her and that the medication is also no longer listed in her medication list. She was encouraged per Dr. Melford Aase to reach out to her previous prescribing provider for a refill of the requested medication.

## 2022-03-08 NOTE — H&P (View-Only) (Signed)
Cardiology Office Note   Date:  03/08/2022   ID:  Paizly, Mcowen [birth year], MRN [id number]  PCP:  Unk Pinto, MD  Cardiologist:   Dorris Carnes, MD   Patient presents for evaluation of atrial fibrillation     History of Present Illness: [name] is a 82 y.o. female with a history of HTN, HL, prediabetes, followed by Dr Melford Aase   Seen in early January  In atrial fibrillation  I saw her in 2011 for [place] and giving out   Echo and stress echo done  OK     The pt follows with Dr Melford Aase  Seen in Dec   and Jan  Found to be in afib   Pt placed on Eliquis  The pt denies palpitations     No dizziness  No syncope   No CP  No fluttering    Breathng is OK    Not as active as she used to be  Has knee problems and scoliosis wich limit her activity   Pt says she does not have sleep apnea    Current Meds  Medication Sig   ALPRAZolam (XANAX) 0.5 MG tablet TAKE 1/2 -1 TABLET AT BEDTIME ONLY IF NEEDED FOR SLEEP & LIMIT TO 5 DAYS /WEEK TO AVOID ADDICTION & DEMENTIA   apixaban (ELIQUIS) 5 MG TABS tablet Take  1 tablet  2 x /day (every 12 hr) to prevent Blood clots   cetirizine (ZYRTEC) 10 MG tablet Take 10 mg by mouth as needed.   Cholecalciferol (VITAMIN D3) 2000 UNITS capsule Take 2,000 Units by mouth daily.   citalopram (CELEXA) 20 MG tablet Take  1 tablet  Daily for Mood                                                   /                                       TAKE                              BY                      MOUTH   diltiazem (CARTIA XT) 240 MG 24 hr capsule Take 1 tablet Daily for  Heart Rhythm   Flaxseed, Linseed, (FLAX SEED OIL) 1000 MG CAPS Take 1,000 mg by mouth daily.   Magnesium 400 MG CAPS Take 400 mg by mouth daily.   montelukast (SINGULAIR) 10 MG tablet TAKE 1 TABLET BY MOUTH DAILY FOR ALLERGIES   Multiple Vitamin (MULTIVITAMIN) tablet Take 1 tablet by mouth daily.   NON FORMULARY Calcium 1800 mg 1 tablet daily   OVER THE COUNTER MEDICATION Otc Mucinex 1 tab  daily   simvastatin (ZOCOR) 40 MG tablet Take  1 tablet  at Bedtime  for Cholesterol                                                   /  TAKE                    BY                       MOUTH   tiZANidine (ZANAFLEX) 4 MG tablet TAKE 1 TABLET (4 MG TOTAL) BY MOUTH EVERY 8 (EIGHT) HOURS AS NEEDED FOR MUSCLE SPASMS   traZODone (DESYREL) 150 MG tablet TAKE 1/2 TO 1 TABLET 1 HOUR BEFORE BEDTIME IF NEEDED FOR SLEEP   vitamin C (ASCORBIC ACID) 500 MG tablet Take 500 mg by mouth daily.     Allergies:   Ace inhibitors, Ampicillin, Mevacor [lovastatin], Penicillins, and Sulfa antibiotics   Past Medical History:  Diagnosis Date   Anxiety    Dyslipidemia    on Rx x20 years   HTN (hypertension)    Hyperlipidemia    Insomnia    Morton's neuroma of left foot    Multiple allergies    Osteoarthritis    Polycythemia    PVC (premature ventricular contraction)     Past Surgical History:  Procedure Laterality Date   ABDOMINAL HYSTERECTOMY     CATARACT EXTRACTION Right 2019   Dr. Ellie Lunch    KNEE ARTHROSCOPY     R   TONSILLECTOMY     VESICOVAGINAL FISTULA CLOSURE W/ TAH       Social History:  The patient  reports that she has never smoked. She has never used smokeless tobacco. She reports current alcohol use.   Family History:  The patient's family history includes Cancer in her father; Diabetes in her brother; Heart attack in her mother; Heart disease in her mother; Hyperlipidemia in her brother; Osteoarthritis in her father and mother.    ROS:  Please see the history of present illness. All other systems are reviewed and  Negative to the above problem except as noted.    PHYSICAL EXAM: VS:  BP (!) 120/90   Pulse (!) 109   Ht 5' 0.5" (1.537 m)   Wt 158 lb 9.6 oz (71.9 kg)   SpO2 97%   BMI 30.46 kg/m   GEN: Well nourished, well developed, in no acute distress  HEENT: normal  Neck: no JVD, no  carotid bruit Cardiac: Irreg irreg  S1, S2  no S3  No  murmurs   Lipedema Respiratory:  clear to auscultation bilaterally,  GI: soft, nontender, nondistended, + BS  No hepatomegaly  MS: no deformity Moving all extremities   Skin: warm and dry, no rash Neuro:  Strength and sensation are intact Psych: euthymic mood, full affect   EKG:  EKG is ordered today.Atrial fibrllation  109 bpm  nonspecific ST changes    Lipid Panel    Component Value Date/Time   CHOL 186 02/02/2022 1508   TRIG 167 (H) 02/02/2022 1508   HDL 75 02/02/2022 1508   CHOLHDL 2.5 02/02/2022 1508   VLDL 18 06/28/2016 1149   LDLCALC 84 02/02/2022 1508      Wt Readings from Last 3 Encounters:  03/08/22 158 lb 9.6 oz (71.9 kg)  02/09/22 161 lb 6.4 oz (73.2 kg)  02/02/22 155 lb 3.2 oz (70.4 kg)      ASSESSMENT AND PLAN:  1  Atrial fib   Persistent (CHADSVASC is 4)    Currently on Elquis   She does not appear to sense     I would recomm echo to define   LVEF /RVEF and LA/RA/Valves I would recomm setting up for cardioversion  4 wks from starting Eliquis       2 HTN  On diltiazem   BP  overall has  been good   Follow     3  HL  LDL 84  HDL 75   Keep on simvistatin    4  vit D  last level high  Recheck     Follow up in clinic after cardioversion    Current medicines are reviewed at length with the patient today.  The patient does not have concerns regarding medicines.  Signed, Dorris Carnes, MD  03/08/2022 3:16 PM    [address], [place], [place]  [address] Phone: [phone]; Fax: [phone]

## 2022-03-08 NOTE — Patient Instructions (Addendum)
Medication Instructions:   *If you need a refill on your cardiac medications before your next appointment, please call your pharmacy*   Lab Work: Bmet, cbc, vit d today  If you have labs (blood work) drawn today and your tests are completely normal, you will receive your results only by: [place] (if you have MyChart) OR A paper copy in the mail If you have any lab test that is abnormal or we need to change your treatment, we will call you to review the results.   Testing/Procedures: Your physician has recommended that you have a Cardioversion (DCCV). Electrical Cardioversion uses a jolt of electricity to your heart either through paddles or wired patches attached to your chest. This is a controlled, usually prescheduled, procedure. Defibrillation is done under light anesthesia in the hospital, and you usually go home the day of the procedure. This is done to get your heart back into a normal rhythm. You are not awake for the procedure. Please see the instruction sheet given to you today.  Your physician has requested that you have an echocardiogram. Echocardiography is a painless test that uses sound waves to create images of your heart. It provides your doctor with information about the size and shape of your heart and how well your heart's chambers and valves are working. This procedure takes approximately one hour. There are no restrictions for this procedure. Please do NOT wear cologne, perfume, aftershave, or lotions (deodorant is allowed). Please arrive 15 minutes prior to your appointment time.    Follow-Up: At [facility], you and your health needs are our priority.  As part of our continuing mission to provide you with exceptional heart care, we have created designated Provider Care Teams.  These Care Teams include your primary Cardiologist (physician) and Advanced Practice Providers (APPs -  Physician Assistants and Nurse Practitioners) who all work together to  provide you with the care you need, when you need it.  We recommend signing up for the patient portal called "MyChart".  Sign up information is provided on this After Visit Summary.  MyChart is used to connect with patients for Virtual Visits (Telemedicine).  Patients are able to view lab/test results, encounter notes, upcoming appointments, etc.  Non-urgent messages can be sent to your provider as well.   To learn more about what you can do with MyChart, go to [email or web address].    Your next appointment: after her Cardioversion 03/25/2022

## 2022-03-08 NOTE — Progress Notes (Signed)
 Cardiology Office Note   Date:  03/08/2022   ID:  Kelsey Lopez, DOB 09/26/1940, MRN [id number]  PCP:  McKeown, William, MD  Cardiologist:   Rydan Gulyas, MD   Patient presents for evaluation of atrial fibrillation     History of Present Illness: Kelsey Lopez is a 81 y.o. female with a history of HTN, HL, prediabetes, followed by Dr McKeown   Seen in early January  In atrial fibrillation  I saw her in 2011 for [place] and giving out   Echo and stress echo done  OK     The pt follows with Dr McKeown  Seen in Dec   and Jan  Found to be in afib   Pt placed on Eliquis  The pt denies palpitations     No dizziness  No syncope   No CP  No fluttering    Breathng is OK    Not as active as she used to be  Has knee problems and scoliosis wich limit her activity   Pt says she does not have sleep apnea    Current Meds  Medication Sig   ALPRAZolam (XANAX) 0.5 MG tablet TAKE 1/2 -1 TABLET AT BEDTIME ONLY IF NEEDED FOR SLEEP & LIMIT TO 5 DAYS /WEEK TO AVOID ADDICTION & DEMENTIA   apixaban (ELIQUIS) 5 MG TABS tablet Take  1 tablet  2 x /day (every 12 hr) to prevent Blood clots   cetirizine (ZYRTEC) 10 MG tablet Take 10 mg by mouth as needed.   Cholecalciferol (VITAMIN D3) 2000 UNITS capsule Take 2,000 Units by mouth daily.   citalopram (CELEXA) 20 MG tablet Take  1 tablet  Daily for Mood                                                   /                                       TAKE                              BY                      MOUTH   diltiazem (CARTIA XT) 240 MG 24 hr capsule Take 1 tablet Daily for  Heart Rhythm   Flaxseed, Linseed, (FLAX SEED OIL) 1000 MG CAPS Take 1,000 mg by mouth daily.   Magnesium 400 MG CAPS Take 400 mg by mouth daily.   montelukast (SINGULAIR) 10 MG tablet TAKE 1 TABLET BY MOUTH DAILY FOR ALLERGIES   Multiple Vitamin (MULTIVITAMIN) tablet Take 1 tablet by mouth daily.   NON FORMULARY Calcium 1800 mg 1 tablet daily   OVER THE COUNTER MEDICATION Otc Mucinex 1 tab  daily   simvastatin (ZOCOR) 40 MG tablet Take  1 tablet  at Bedtime  for Cholesterol                                                   /                                      TAKE                    BY                       MOUTH   tiZANidine (ZANAFLEX) 4 MG tablet TAKE 1 TABLET (4 MG TOTAL) BY MOUTH EVERY 8 (EIGHT) HOURS AS NEEDED FOR MUSCLE SPASMS   traZODone (DESYREL) 150 MG tablet TAKE 1/2 TO 1 TABLET 1 HOUR BEFORE BEDTIME IF NEEDED FOR SLEEP   vitamin C (ASCORBIC ACID) 500 MG tablet Take 500 mg by mouth daily.     Allergies:   Ace inhibitors, Ampicillin, Mevacor [lovastatin], Penicillins, and Sulfa antibiotics   Past Medical History:  Diagnosis Date   Anxiety    Dyslipidemia    on Rx x20 years   HTN (hypertension)    Hyperlipidemia    Insomnia    Morton's neuroma of left foot    Multiple allergies    Osteoarthritis    Polycythemia    PVC (premature ventricular contraction)     Past Surgical History:  Procedure Laterality Date   ABDOMINAL HYSTERECTOMY     CATARACT EXTRACTION Right 2019   Dr. McCuen    KNEE ARTHROSCOPY     R   TONSILLECTOMY     VESICOVAGINAL FISTULA CLOSURE W/ TAH       Social History:  The patient  reports that she has never smoked. She has never used smokeless tobacco. She reports current alcohol use.   Family History:  The patient's family history includes Cancer in her father; Diabetes in her brother; Heart attack in her mother; Heart disease in her mother; Hyperlipidemia in her brother; Osteoarthritis in her father and mother.    ROS:  Please see the history of present illness. All other systems are reviewed and  Negative to the above problem except as noted.    PHYSICAL EXAM: VS:  BP (!) 120/90   Pulse (!) 109   Ht 5' 0.5" (1.537 m)   Wt 158 lb 9.6 oz (71.9 kg)   SpO2 97%   BMI 30.46 kg/m   GEN: Well nourished, well developed, in no acute distress  HEENT: normal  Neck: no JVD, no  carotid bruit Cardiac: Irreg irreg  S1, S2  no S3  No  murmurs   Lipedema Respiratory:  clear to auscultation bilaterally,  GI: soft, nontender, nondistended, + BS  No hepatomegaly  MS: no deformity Moving all extremities   Skin: warm and dry, no rash Neuro:  Strength and sensation are intact Psych: euthymic mood, full affect   EKG:  EKG is ordered today.Atrial fibrllation  109 bpm  nonspecific ST changes    Lipid Panel    Component Value Date/Time   CHOL 186 02/02/2022 1508   TRIG 167 (H) 02/02/2022 1508   HDL 75 02/02/2022 1508   CHOLHDL 2.5 02/02/2022 1508   VLDL 18 06/28/2016 1149   LDLCALC 84 02/02/2022 1508      Wt Readings from Last 3 Encounters:  03/08/22 158 lb 9.6 oz (71.9 kg)  02/09/22 161 lb 6.4 oz (73.2 kg)  02/02/22 155 lb 3.2 oz (70.4 kg)      ASSESSMENT AND PLAN:  1  Atrial fib   Persistent (CHADSVASC is 4)    Currently on Elquis   She does not appear to sense     I would recomm echo to define   LVEF /RVEF and LA/RA/Valves I would recomm setting up for cardioversion   4 wks from starting Eliquis       2 HTN  On diltiazem   BP  overall has  been good   Follow     3  HL  LDL 84  HDL 75   Keep on simvistatin    4  vit D  last level high  Recheck     Follow up in clinic after cardioversion    Current medicines are reviewed at length with the patient today.  The patient does not have concerns regarding medicines.  Signed, Aemon Koeller, MD  03/08/2022 3:16 PM    [facility] [address], , [place]  [address] Phone: [phone]; Fax: [phone]    

## 2022-03-10 IMAGING — DX DG CHEST 1V PORT
1 series · 1 of 1 positions shown · non-contrast
Comparison: 06/06/2020

CLINICAL DATA: Tachycardia

EXAM:
PORTABLE CHEST 1 VIEW

[chest ap]
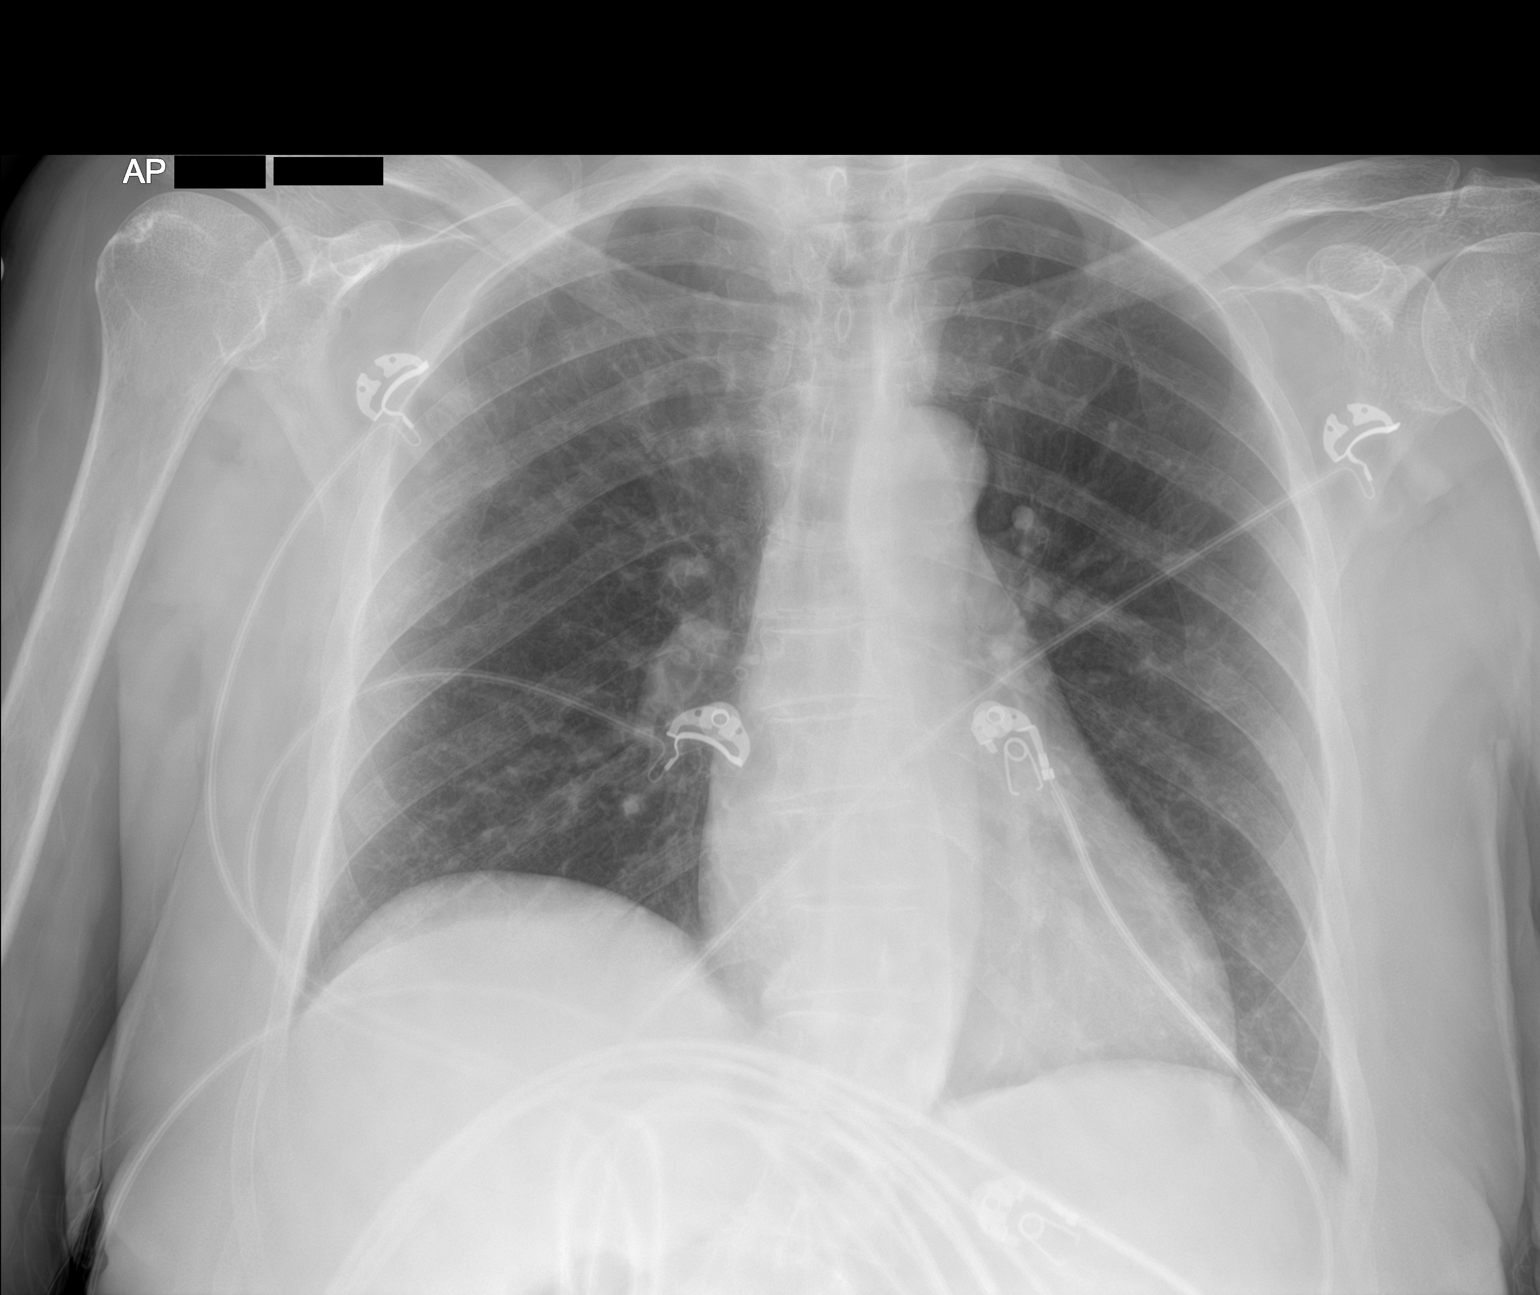

[1 of 1 positions shown; findings below may reference images not displayed]

FINDINGS: The heart size and mediastinal contours are within normal limits.
Both lungs are clear. The visualized skeletal structures are
unremarkable.

Unchanged elevation of right hemidiaphragm.
IMPRESSION: No acute cardiopulmonary process

## 2022-03-25 ENCOUNTER — Ambulatory Visit (HOSPITAL_COMMUNITY)
Admission: RE | Admit: 2022-03-25 | Discharge: 2022-03-25 | Disposition: A | Discharge status: Home / Self Care | Payer: Medicare Other | Attending: Cardiology | Admitting: Cardiology

## 2022-03-25 ENCOUNTER — Encounter (HOSPITAL_COMMUNITY): Payer: Self-pay | Admitting: Cardiology

## 2022-03-25 ENCOUNTER — Ambulatory Visit (HOSPITAL_COMMUNITY): Payer: Medicare Other | Admitting: Anesthesiology

## 2022-03-25 ENCOUNTER — Other Ambulatory Visit: Payer: Self-pay

## 2022-03-25 ENCOUNTER — Encounter (HOSPITAL_COMMUNITY)
Admission: RE | Disposition: A | Discharge status: Home / Self Care | Payer: Self-pay | Source: Home / Self Care | Attending: Cardiology

## 2022-03-25 ENCOUNTER — Ambulatory Visit (HOSPITAL_BASED_OUTPATIENT_CLINIC_OR_DEPARTMENT_OTHER): Payer: Medicare Other | Admitting: Anesthesiology

## 2022-03-25 DIAGNOSIS — I4819 Other persistent atrial fibrillation: Secondary | ICD-10-CM | POA: Diagnosis not present

## 2022-03-25 DIAGNOSIS — F418 Other specified anxiety disorders: Secondary | ICD-10-CM | POA: Diagnosis not present

## 2022-03-25 DIAGNOSIS — Z01818 Encounter for other preprocedural examination: Secondary | ICD-10-CM

## 2022-03-25 DIAGNOSIS — I4891 Unspecified atrial fibrillation: Secondary | ICD-10-CM

## 2022-03-25 DIAGNOSIS — Z7901 Long term (current) use of anticoagulants: Secondary | ICD-10-CM | POA: Insufficient documentation

## 2022-03-25 DIAGNOSIS — N189 Chronic kidney disease, unspecified: Secondary | ICD-10-CM

## 2022-03-25 DIAGNOSIS — I10 Essential (primary) hypertension: Secondary | ICD-10-CM | POA: Diagnosis not present

## 2022-03-25 DIAGNOSIS — I471 Supraventricular tachycardia, unspecified: Secondary | ICD-10-CM

## 2022-03-25 DIAGNOSIS — I129 Hypertensive chronic kidney disease with stage 1 through stage 4 chronic kidney disease, or unspecified chronic kidney disease: Secondary | ICD-10-CM

## 2022-03-25 HISTORY — PX: CARDIOVERSION: SHX1299

## 2022-03-25 LAB — POCT I-STAT, CHEM 8
BUN: 17 mg/dL (ref 8–23)
Calcium, Ion: 1.13 mmol/L — ABNORMAL LOW (ref 1.15–1.40)
Chloride: 107 mmol/L (ref 98–111)
Creatinine, Ser: 1 mg/dL (ref 0.44–1.00)
Glucose, Bld: 108 mg/dL — ABNORMAL HIGH (ref 70–99)
HCT: 35 % — ABNORMAL LOW (ref 36.0–46.0)
Hemoglobin: 11.9 g/dL — ABNORMAL LOW (ref 12.0–15.0)
Potassium: 3.8 mmol/L (ref 3.5–5.1)
Sodium: 140 mmol/L (ref 135–145)
TCO2: 21 mmol/L — ABNORMAL LOW (ref 22–32)

## 2022-03-25 SURGERY — CARDIOVERSION
Anesthesia: General

## 2022-03-25 MED ORDER — SODIUM CHLORIDE 0.9 % IV SOLN: INTRAVENOUS | Status: DC | PRN

## 2022-03-25 MED ORDER — SODIUM CHLORIDE 0.9 % IV SOLN: INTRAVENOUS | Status: DC

## 2022-03-25 MED ORDER — LIDOCAINE 2% (20 MG/ML) 5 ML SYRINGE
INTRAMUSCULAR | Status: DC | PRN
  Administered 2022-03-25: 100 mg via INTRAVENOUS

## 2022-03-25 MED ORDER — PROPOFOL 10 MG/ML IV BOLUS
INTRAVENOUS | Status: DC | PRN
  Administered 2022-03-25: 70 mg via INTRAVENOUS

## 2022-03-25 NOTE — Anesthesia Postprocedure Evaluation (Signed)
Anesthesia Post Note  Patient: Kelsey Lopez  Procedure(s) Performed: CARDIOVERSION     Patient location during evaluation: PACU Anesthesia Type: General Level of consciousness: awake and alert Pain management: pain level controlled Vital Signs Assessment: post-procedure vital signs reviewed and stable Respiratory status: spontaneous breathing, nonlabored ventilation, respiratory function stable and patient connected to nasal cannula oxygen Cardiovascular status: blood pressure returned to baseline and stable Postop Assessment: no apparent nausea or vomiting Anesthetic complications: no   No notable events documented.  Last Vitals:  Vitals:   03/25/22 1000 03/25/22 1010  BP: 120/65 135/69  Pulse: 76 78  Resp: 18 19  Temp:    SpO2: 96% 95%    Last Pain:  Vitals:   03/25/22 1010  TempSrc: Temporal  PainSc: 0-No pain                 Mendel Binsfeld

## 2022-03-25 NOTE — Anesthesia Procedure Notes (Signed)
Procedure Name: General with mask airway Date/Time: 03/25/2022 9:45 AM  Performed by: Mariea Clonts, CRNAPre-anesthesia Checklist: Timeout performed, Patient being monitored, Suction available, Emergency Drugs available and Patient identified Patient Re-evaluated:Patient Re-evaluated prior to induction Oxygen Delivery Method: Simple face mask Preoxygenation: Pre-oxygenation with 100% oxygen Induction Type: IV induction Ventilation: Mask ventilation without difficulty

## 2022-03-25 NOTE — Transfer of Care (Signed)
Immediate Anesthesia Transfer of Care Note  Patient: Kelsey Lopez  Procedure(s) Performed: CARDIOVERSION  Patient Location: Endoscopy Unit  Anesthesia Type:General  Level of Consciousness: awake, alert , and oriented  Airway & Oxygen Therapy: Patient Spontanous Breathing  Post-op Assessment: Report given to RN and Post -op Vital signs reviewed and stable  Post vital signs: Reviewed and stable  Last Vitals:  Vitals Value Taken Time  BP    Temp    Pulse    Resp    SpO2      Last Pain:  Vitals:   03/25/22 0910  TempSrc: Temporal  PainSc: 3          Complications: No notable events documented.

## 2022-03-25 NOTE — Interval H&P Note (Signed)
History and Physical Interval Note:  03/25/2022 9:18 AM  Kelsey Lopez  has presented today for surgery, with the diagnosis of atrial fibrillation.  The various methods of treatment have been discussed with the patient and family. After consideration of risks, benefits and other options for treatment, the patient has consented to  Procedure(s): CARDIOVERSION (N/A) as a surgical intervention.  The patient's history has been reviewed, patient examined, no change in status, stable for surgery.  I have reviewed the patient's chart and labs.  Questions were answered to the patient's satisfaction.     Fransico Him

## 2022-03-25 NOTE — Discharge Instructions (Signed)

## 2022-03-25 NOTE — CV Procedure (Signed)
    Electrical Cardioversion Procedure Note Kelsey Lopez [id number] 1940-09-08  Procedure: Electrical Cardioversion Indications:  Atrial Fibrillation  Time Out: Verified patient identification, verified procedure,medications/allergies/relevent history reviewed, required imaging and test results available.  Performed  Procedure Details  The patient was NPO after midnight. Anesthesia was administered at the beside  by Dr.Oddono with '70mg'$  of propofol and '100mg'$  Lidocaine.  Cardioversion was done with synchronized biphasic defibrillation with AP pads with 150watts.  The patient converted to normal sinus rhythm. The patient tolerated the procedure well   IMPRESSION:  Successful cardioversion of atrial fibrillation    Kelsey Lopez 03/25/2022, 9:19 AM

## 2022-03-25 NOTE — Anesthesia Preprocedure Evaluation (Addendum)
Anesthesia Evaluation  Patient identified by MRN, date of birth, ID band Patient awake    Reviewed: Allergy & Precautions, H&P , NPO status , Patient's Chart, lab work & pertinent test results  Airway Mallampati: II  TM Distance: >3 FB Neck ROM: Full    Dental no notable dental hx. (+) Teeth Intact, Dental Advisory Given, Caps   Pulmonary neg pulmonary ROS   Pulmonary exam normal breath sounds clear to auscultation       Cardiovascular Exercise Tolerance: Good hypertension, Pt. on medications negative cardio ROS Normal cardiovascular exam+ dysrhythmias  Rhythm:Regular Rate:Normal     Neuro/Psych  PSYCHIATRIC DISORDERS Anxiety Depression     Neuromuscular disease negative neurological ROS  negative psych ROS   GI/Hepatic negative GI ROS, Neg liver ROS,,,  Endo/Other  negative endocrine ROS    Renal/GU CRFRenal diseasenegative Renal ROS  negative genitourinary   Musculoskeletal negative musculoskeletal ROS (+) Arthritis , Osteoarthritis,    Abdominal   Peds negative pediatric ROS (+)  Hematology negative hematology ROS (+)   Anesthesia Other Findings   Reproductive/Obstetrics negative OB ROS                             Anesthesia Physical Anesthesia Plan  ASA: 3  Anesthesia Plan: General   Post-op Pain Management: Minimal or no pain anticipated   Induction: Intravenous  PONV Risk Score and Plan: 3 and Propofol infusion  Airway Management Planned: Natural Airway, Simple Face Mask and Mask  Additional Equipment: None  Intra-op Plan:   Post-operative Plan:   Informed Consent: I have reviewed the patients History and Physical, chart, labs and discussed the procedure including the risks, benefits and alternatives for the proposed anesthesia with the patient or authorized representative who has indicated his/her understanding and acceptance.       Plan Discussed with:  Anesthesiologist and CRNA  Anesthesia Plan Comments:         Anesthesia Quick Evaluation

## 2022-03-26 ENCOUNTER — Telehealth: Payer: Self-pay

## 2022-03-26 DIAGNOSIS — Z79899 Other long term (current) drug therapy: Secondary | ICD-10-CM

## 2022-03-26 DIAGNOSIS — E785 Hyperlipidemia, unspecified: Secondary | ICD-10-CM

## 2022-03-26 MED ORDER — ROSUVASTATIN CALCIUM 20 MG PO TABS: 20.0000 mg | ORAL_TABLET | Freq: Every day | ORAL | 3 refills | Status: DC

## 2022-03-26 NOTE — Telephone Encounter (Signed)
Per Dr Harrington Challenger,  Please switch patient to Crestor 20 mg     Follow up lipomed in 8 wks   Pt advised and asks to schedule her lab appt at her next OV 03/30/22 Echo.

## 2022-03-28 ENCOUNTER — Encounter (HOSPITAL_COMMUNITY): Payer: Self-pay | Admitting: Cardiology

## 2022-03-30 ENCOUNTER — Ambulatory Visit (HOSPITAL_COMMUNITY): Payer: Medicare Other | Attending: Internal Medicine

## 2022-03-30 DIAGNOSIS — I4891 Unspecified atrial fibrillation: Secondary | ICD-10-CM | POA: Diagnosis not present

## 2022-03-30 DIAGNOSIS — Z01810 Encounter for preprocedural cardiovascular examination: Secondary | ICD-10-CM | POA: Insufficient documentation

## 2022-03-30 LAB — ECHOCARDIOGRAM COMPLETE
Area-P 1/2: 4.96 cm2
S' Lateral: 3.1 cm

## 2022-03-31 NOTE — Progress Notes (Signed)
Cardiology Office Note:    Date:  04/02/2022   ID:  Tremayne, Wergin 07/08/1940, MRN [id number]  PCP:  Unk Pinto, MD   [facility] Providers Cardiologist:  None     Referring MD: Unk Pinto, MD   Chief Complaint: follow-up atrial fibrillation  History of Present Illness:    Kelsey Lopez is a very pleasant 82 y.o. female with a hx of HTN, HLD, and new onset atrial fibrillation 02/2022.   Seen in 2011 by Dr. Harrington Challenger for dyspnea on exertion. Echo and stress at that time ok. Seen by PCP in December and January, found to be in atrial fibrillation. Placed on Eliquis.    Seen by Dr. Harrington Challenger 03/08/2022 she denied palpitations, dizziness, syncope, chest pain, fluttering. Denies history of sleep apnea. Following 4 weeks of anticoagulation, she was advised to undergo cardioversion which was successfully completed on 03/25/2022. 2D echo 03/30/2022 revealed normal LVEF 60 to 65%, no RWMA, mild LVH, grade 3 diastolic dysfunction, moderately elevated pulmonary artery systolic pressure, moderate MR with centrally directed jet, mild calcification of the aortic valve with no stenosis present, mild PR.  Today, she is here alone for follow-up. Has felt her heart racing today but at no other times since cardioversion. Monitors at home with BP monitor, no evidence of fast HR. Reports only discomfort she has is from chronic back pain and it is particularly uncomfortable right now. Is wearing a back brace,has degenerative scoliosis, and has run out of tramadol. States her lower legs have been swollen for as long as she can remember. Improved since she stopped working full time.  She denies chest pain, shortness of breath, fatigue, palpitations, melena, hematuria, hemoptysis, diaphoresis, weakness, presyncope, syncope, orthopnea, and PND.  Past Medical History:  Diagnosis Date   Anxiety    Dyslipidemia    on Rx x20 years   HTN (hypertension)    Hyperlipidemia    Insomnia    Morton's neuroma of  left foot    Multiple allergies    Osteoarthritis    Polycythemia    PVC (premature ventricular contraction)     Past Surgical History:  Procedure Laterality Date   ABDOMINAL HYSTERECTOMY     CARDIOVERSION N/A 03/25/2022   Procedure: CARDIOVERSION;  Surgeon: Sueanne Margarita, MD;  Location: [facility];  Service: Cardiovascular;  Laterality: N/A;   CATARACT EXTRACTION Right 2019   Dr. Ellie Lunch    KNEE ARTHROSCOPY     R   TONSILLECTOMY     VESICOVAGINAL FISTULA CLOSURE W/ TAH      Current Medications: Current Meds  Medication Sig   ALPRAZolam (XANAX) 0.5 MG tablet TAKE 1/2 -1 TABLET AT BEDTIME ONLY IF NEEDED FOR SLEEP & LIMIT TO 5 DAYS /WEEK TO AVOID ADDICTION & DEMENTIA   apixaban (ELIQUIS) 5 MG TABS tablet Take  1 tablet  2 x /day (every 12 hr) to prevent Blood clots   cetirizine (ZYRTEC) 10 MG tablet Take 10 mg by mouth daily.   Cholecalciferol (VITAMIN D3) 2000 UNITS capsule Take 2,000 Units by mouth daily.   citalopram (CELEXA) 20 MG tablet Take  1 tablet  Daily for Mood                                                   /  TAKE                              BY                      MOUTH   diltiazem (CARTIA XT) 240 MG 24 hr capsule Take 1 tablet Daily for  Heart Rhythm   Flaxseed, Linseed, (FLAX SEED OIL) 1000 MG CAPS Take 1,000 mg by mouth daily.   GUAIFENESIN ER PO Take 400 mg by mouth every morning. Mucinex   Magnesium 400 MG CAPS Take 400 mg by mouth daily.   meloxicam (MOBIC) 15 MG tablet Take 15 mg by mouth daily.   metoprolol succinate (TOPROL XL) 25 MG 24 hr tablet Take 1 tablet (25 mg total) by mouth daily.   montelukast (SINGULAIR) 10 MG tablet TAKE 1 TABLET BY MOUTH DAILY FOR ALLERGIES   Multiple Vitamin (MULTIVITAMIN) tablet Take 1 tablet by mouth daily.   NON FORMULARY Take 1,800 mg by mouth daily. Calcium 1800 mg   rosuvastatin (CRESTOR) 20 MG tablet Take 1 tablet (20 mg total) by mouth daily.   tiZANidine (ZANAFLEX) 4 MG tablet  TAKE 1 TABLET (4 MG TOTAL) BY MOUTH EVERY 8 (EIGHT) HOURS AS NEEDED FOR MUSCLE SPASMS   traZODone (DESYREL) 150 MG tablet TAKE 1/2 TO 1 TABLET 1 HOUR BEFORE BEDTIME IF NEEDED FOR SLEEP (Patient taking differently: Take 150 mg by mouth at bedtime.)   vitamin C (ASCORBIC ACID) 500 MG tablet Take 500 mg by mouth daily.     Allergies:   Ace inhibitors, Ampicillin, Mevacor [lovastatin], Penicillins, and Sulfa antibiotics   Social History   Socioeconomic History   Marital status: Widowed    Spouse name: Not on file   Number of children: Not on file   Years of education: Not on file   Highest education level: Not on file  Occupational History   Not on file  Tobacco Use   Smoking status: Never   Smokeless tobacco: Never   Tobacco comments:    no tobacco   Substance and Sexual Activity   Alcohol use: Yes    Comment: rare   Drug use: Not on file   Sexual activity: Not on file  Other Topics Concern   Not on file  Social History Narrative   Married    Social Determinants of Health   Financial Resource Strain: Not on file  Food Insecurity: Not on file  Transportation Needs: Not on file  Physical Activity: Not on file  Stress: Not on file  Social Connections: Not on file     Family History: The patient's family history includes Cancer in her father; Diabetes in her brother; Heart attack in her mother; Heart disease in her mother; Hyperlipidemia in her brother; Osteoarthritis in her father and mother.  ROS:   Please see the history of present illness.    + tachycardia + chronic back pain All other systems reviewed and are negative.  Labs/Other Studies Reviewed:    The following studies were reviewed today:  Echo 03/30/22 1. Left ventricular ejection fraction, by estimation, is 60 to 65%. The  left ventricle has normal function. The left ventricle has no regional  wall motion abnormalities. There is mild concentric left ventricular  hypertrophy. Left ventricular diastolic   parameters are consistent with Grade III diastolic dysfunction  (restrictive).   2. Right ventricular systolic function is normal. The right ventricular  size is  normal. There is moderately elevated pulmonary artery systolic  pressure. The estimated right ventricular systolic pressure is 0000000 mmHg.   3. Left atrial size was severely dilated.   4. Right atrial size was mildly dilated.   5. The mitral valve is normal in structure. Moderate mitral valve  regurgitation. No evidence of mitral stenosis.   6. Tricuspid valve regurgitation is mild to moderate.   7. The aortic valve is tricuspid. There is mild calcification of the  aortic valve. Aortic valve regurgitation is not visualized. No aortic  stenosis is present.   8. The inferior vena cava is normal in size with <50% respiratory  variability, suggesting right atrial pressure of 8 mmHg.    Recent Labs: 02/02/2022: ALT 22; Magnesium 1.9; Platelets 280; TSH 2.24 03/25/2022: BUN 17; Creatinine, Ser 1.00; Hemoglobin 11.9; Potassium 3.8; Sodium 140  Recent Lipid Panel    Component Value Date/Time   CHOL 186 02/02/2022 1508   TRIG 167 (H) 02/02/2022 1508   HDL 75 02/02/2022 1508   CHOLHDL 2.5 02/02/2022 1508   VLDL 18 06/28/2016 1149   LDLCALC 84 02/02/2022 1508     Risk Assessment/Calculations:    CHA2DS2-VASc Score = 4  This indicates a 4.8% annual risk of stroke. The patient's score is based upon: CHF History: 0 HTN History: 1 Diabetes History: 0 Stroke History: 0 Vascular Disease History: 0 Age Score: 2 Gender Score: 1    Physical Exam:    VS:  BP (!) 140/72   Pulse (!) 159   Ht '5\' 6"'$  (1.676 m)   Wt 152 lb (68.9 kg)   SpO2 95%   BMI 24.53 kg/m     Wt Readings from Last 3 Encounters:  04/02/22 152 lb (68.9 kg)  03/25/22 155 lb (70.3 kg)  03/08/22 158 lb 9.6 oz (71.9 kg)     GEN:  Well nourished, well developed in no acute distress HEENT: Normal NECK: No JVD; No carotid bruits CARDIAC: RRR, no murmurs,  rubs, gallops RESPIRATORY:  Clear to auscultation without rales, wheezing or rhonchi  ABDOMEN: Soft, non-tender, non-distended MUSCULOSKELETAL:  Non pitting bilateral LE edema; No deformity. 2+ pedal pulses, equal bilaterally SKIN: Warm and dry NEUROLOGIC:  Alert and oriented x 3 PSYCHIATRIC:  Normal affect   EKG:  EKG is ordered today.  The initial ekg ordered today demonstrates SVT at 159 bpm with subsequent slowing of HR with SR, PACs Repeat EKG 40 minutes later revealed normal sinus rhythm at 83 bpm with prolonged QT   HYPERTENSION CONTROL Vitals:   04/02/22 1417 04/02/22 1442  BP: (!) 184/86 (!) 140/72    The patient's blood pressure is elevated above target today.  In order to address the patient's elevated BP: A new medication was prescribed today.     Diagnoses:    1. SVT (supraventricular tachycardia)   2. Medication management   3. PAF (paroxysmal atrial fibrillation) ([place])   4. Essential hypertension   5. Hyperlipidemia, unspecified hyperlipidemia type   6. Nonspecific abnormal electrocardiogram (ECG) (EKG)   7. Chronic heart failure with preserved ejection fraction (HFpEF) ([place])   8. Chronic anticoagulation    Assessment and Plan:     Abnormal EKG/SVT: Initial EKG revealed SVT at 159 bpm with evidence of slowing on the same EKG. EKG repeated 30-40 minutes later revealed NSR at 83 bpm.  ST depression noted on initial EKG with improvement on subsequent EKG reviewed with Dr. Acie Fredrickson. He feels that it is tachycardia mediated and age related. No chest pain,  dyspnea. Will have her return in 1 to 2 weeks for follow-up EKG. Advised her to notify us if she has symptoms to angina. Back pain may be contributing to tachycardia. Adding metoprolol 25 mg once daily. Close follow-up.   PAF on chronic anticoagulation: Successful DCCV on 03/24/2022.  No missed doses of Eliquis.  No bleeding concerns. As noted above has noted faster HR today. No evidence on EKG of return of a fib. We are  adding metoprolol for management of SVT. Continue Eliquis 5 mg twice daily which is appropriate dose.   HFpEF: Normal LV function, grade 3 diastolic dysfunction on echo 03/30/22.  Reports chronic LE edema that has improved recently per her report. No dyspnea, shortness of breath, orthopnea, PND. Appears euvolemic on exam. Dr. Harrington Challenger, primary cardiologist, has ordered a limited echo to further evaluated LV strain. She is physically limited by back pain. Mostly sits and reads. Encouraged low-sodium, heart healthy diet. Monitor daily weight.   Hypertension: BP is elevated today and remains elevated on my recheck.  Reports home BP readings are better.  We are adding metoprolol as noted above.  Advised her to continue to monitor BP.  Hyperlipidemia LDL goal < 100: LDL 84, elevated triglycerides at 167 on 02/02/22. Getting NMR per Dr. Harrington Challenger.      Disposition: 1-2 weeks with me  Medication Adjustments/Labs and Tests Ordered: Current medicines are reviewed at length with the patient today.  Concerns regarding medicines are outlined above.  Orders Placed This Encounter  Procedures   EKG 12-Lead   Meds ordered this encounter  Medications   metoprolol succinate (TOPROL XL) 25 MG 24 hr tablet    Sig: Take 1 tablet (25 mg total) by mouth daily.    Dispense:  90 tablet    Refill:  3    Patient Instructions  Medication Instructions:   START Toprol one (1) tablet by mouth ( 25 mg ) daily.   *If you need a refill on your cardiac medications before your next appointment, please call your pharmacy*   Lab Work:  TODAY!!!!1 BMET/CBC/VIT D/NMR/LIPO PROFILE/PRO BNP  If you have labs (blood work) drawn today and your tests are completely normal, you will receive your results only by: [place] (if you have MyChart) OR A paper copy in the mail If you have any lab test that is abnormal or we need to change your treatment, we will call you to review the results.   Testing/Procedures:  Your  physician has requested that you have an echocardiogram. Echocardiography is a painless test that uses sound waves to create images of your heart. It provides your doctor with information about the size and shape of your heart and how well your heart's chambers and valves are working. This procedure takes approximately one hour. There are no restrictions for this procedure. Please do NOT wear perfume or lotions (deodorant is allowed). Please arrive 15 minutes prior to your appointment time.   Follow-Up: At [facility], you and your health needs are our priority.  As part of our continuing mission to provide you with exceptional heart care, we have created designated Provider Care Teams.  These Care Teams include your primary Cardiologist (physician) and Advanced Practice Providers (APPs -  Physician Assistants and Nurse Practitioners) who all work together to provide you with the care you need, when you need it.  We recommend signing up for the patient portal called "MyChart".  Sign up information is provided on this After Visit Summary.  MyChart  is used to connect with patients for Virtual Visits (Telemedicine).  Patients are able to view lab/test results, encounter notes, upcoming appointments, etc.  Non-urgent messages can be sent to your provider as well.   To learn more about what you can do with MyChart, go to [email or web address].    Your next appointment:   1 week(s)  Provider:   Lauree Chandler, MD       Signed, Emmaline Life, NP  04/02/2022 4:44 PM    [place]

## 2022-04-01 ENCOUNTER — Telehealth: Payer: Self-pay

## 2022-04-01 DIAGNOSIS — I10 Essential (primary) hypertension: Secondary | ICD-10-CM

## 2022-04-01 DIAGNOSIS — Z79899 Other long term (current) drug therapy: Secondary | ICD-10-CM

## 2022-04-01 DIAGNOSIS — I4891 Unspecified atrial fibrillation: Secondary | ICD-10-CM

## 2022-04-01 NOTE — Telephone Encounter (Signed)
The patient has been notified of the result and verbalized understanding.  All questions (if any) were answered. Pt has APP appt tomorrow that she would like to keep to go over all of this and she will have her labs while she is here.

## 2022-04-01 NOTE — Telephone Encounter (Signed)
-----   Message from Fay Records, MD sent at 03/31/2022  3:02 PM EST ----- Echo shows normal systolic function of LV and RV  There is mild LVH The atria are dilated   Explains tendency for afib  The Left ventricle appears stiffer, with decreased relaxation Please set pt up for liimited echo images to assess strain pattern Also set pt up for BMET and BNP

## 2022-04-02 ENCOUNTER — Ambulatory Visit: Payer: Medicare Other

## 2022-04-02 ENCOUNTER — Ambulatory Visit: Payer: Medicare Other | Attending: Nurse Practitioner | Admitting: Nurse Practitioner

## 2022-04-02 ENCOUNTER — Encounter: Payer: Self-pay | Admitting: Nurse Practitioner

## 2022-04-02 VITALS — BP 140/72 | HR 159 | Ht 66.0 in | Wt 152.0 lb

## 2022-04-02 DIAGNOSIS — E785 Hyperlipidemia, unspecified: Secondary | ICD-10-CM

## 2022-04-02 DIAGNOSIS — I10 Essential (primary) hypertension: Secondary | ICD-10-CM

## 2022-04-02 DIAGNOSIS — I480 Paroxysmal atrial fibrillation: Secondary | ICD-10-CM

## 2022-04-02 DIAGNOSIS — I471 Supraventricular tachycardia, unspecified: Secondary | ICD-10-CM

## 2022-04-02 DIAGNOSIS — Z01810 Encounter for preprocedural cardiovascular examination: Secondary | ICD-10-CM | POA: Diagnosis not present

## 2022-04-02 DIAGNOSIS — Z7901 Long term (current) use of anticoagulants: Secondary | ICD-10-CM

## 2022-04-02 DIAGNOSIS — I5032 Chronic diastolic (congestive) heart failure: Secondary | ICD-10-CM

## 2022-04-02 DIAGNOSIS — Z79899 Other long term (current) drug therapy: Secondary | ICD-10-CM

## 2022-04-02 DIAGNOSIS — R9431 Abnormal electrocardiogram [ECG] [EKG]: Secondary | ICD-10-CM

## 2022-04-02 DIAGNOSIS — I4891 Unspecified atrial fibrillation: Secondary | ICD-10-CM | POA: Diagnosis not present

## 2022-04-02 MED ORDER — METOPROLOL SUCCINATE ER 25 MG PO TB24: 25.0000 mg | ORAL_TABLET | Freq: Every day | ORAL | 3 refills | Status: DC

## 2022-04-02 NOTE — Patient Instructions (Signed)
Medication Instructions:   START Toprol one (1) tablet by mouth ( 25 mg ) daily.   *If you need a refill on your cardiac medications before your next appointment, please call your pharmacy*   Lab Work:  TODAY!!!!1 BMET/CBC/VIT D/NMR/LIPO PROFILE/PRO BNP  If you have labs (blood work) drawn today and your tests are completely normal, you will receive your results only by: [place] (if you have MyChart) OR A paper copy in the mail If you have any lab test that is abnormal or we need to change your treatment, we will call you to review the results.   Testing/Procedures:  Your physician has requested that you have an echocardiogram. Echocardiography is a painless test that uses sound waves to create images of your heart. It provides your doctor with information about the size and shape of your heart and how well your heart's chambers and valves are working. This procedure takes approximately one hour. There are no restrictions for this procedure. Please do NOT wear perfume or lotions (deodorant is allowed). Please arrive 15 minutes prior to your appointment time.   Follow-Up: At [facility], you and your health needs are our priority.  As part of our continuing mission to provide you with exceptional heart care, we have created designated Provider Care Teams.  These Care Teams include your primary Cardiologist (physician) and Advanced Practice Providers (APPs -  Physician Assistants and Nurse Practitioners) who all work together to provide you with the care you need, when you need it.  We recommend signing up for the patient portal called "MyChart".  Sign up information is provided on this After Visit Summary.  MyChart is used to connect with patients for Virtual Visits (Telemedicine).  Patients are able to view lab/test results, encounter notes, upcoming appointments, etc.  Non-urgent messages can be sent to your provider as well.   To learn more about what you can do with  MyChart, go to [email or web address].    Your next appointment:   1 week(s)  Provider:   Lauree Chandler, MD

## 2022-04-03 LAB — BASIC METABOLIC PANEL
BUN/Creatinine Ratio: 10 — ABNORMAL LOW (ref 12–28)
BUN: 9 mg/dL (ref 8–27)
CO2: 22 mmol/L (ref 20–29)
Calcium: 9.7 mg/dL (ref 8.7–10.3)
Chloride: 102 mmol/L (ref 96–106)
Creatinine, Ser: 0.87 mg/dL (ref 0.57–1.00)
Glucose: 113 mg/dL — ABNORMAL HIGH (ref 70–99)
Potassium: 3.5 mmol/L (ref 3.5–5.2)
Sodium: 142 mmol/L (ref 134–144)
eGFR: 67 mL/min/{1.73_m2} (ref >59)

## 2022-04-03 LAB — NMR, LIPOPROFILE
Cholesterol, Total: 164 mg/dL (ref 100–199)
HDL Particle Number: 36.2 umol/L (ref >=30.5)
HDL-C: 67 mg/dL (ref >39)
LDL Particle Number: 755 nmol/L (ref <1000)
LDL Size: 20.4 nm — ABNORMAL LOW (ref >20.5)
LDL-C (NIH Calc): 76 mg/dL (ref 0–99)
LP-IR Score: <25 (ref <=45)
Small LDL Particle Number: 310 nmol/L (ref <=527)
Triglycerides: 122 mg/dL (ref 0–149)

## 2022-04-03 LAB — PRO B NATRIURETIC PEPTIDE: NT-Pro BNP: 735 pg/mL (ref 0–738)

## 2022-04-05 ENCOUNTER — Other Ambulatory Visit: Payer: Self-pay

## 2022-04-05 LAB — BASIC METABOLIC PANEL
BUN/Creatinine Ratio: 11 — ABNORMAL LOW (ref 12–28)
BUN: 9 mg/dL (ref 8–27)
CO2: 22 mmol/L (ref 20–29)
Calcium: 9.8 mg/dL (ref 8.7–10.3)
Chloride: 103 mmol/L (ref 96–106)
Creatinine, Ser: 0.84 mg/dL (ref 0.57–1.00)
Glucose: 106 mg/dL — ABNORMAL HIGH (ref 70–99)
Potassium: 3.5 mmol/L (ref 3.5–5.2)
Sodium: 143 mmol/L (ref 134–144)
eGFR: 70 mL/min/{1.73_m2} (ref >59)

## 2022-04-05 LAB — CBC
Hematocrit: 37.1 % (ref 34.0–46.6)
Hemoglobin: 12.1 g/dL (ref 11.1–15.9)
MCH: 30.6 pg (ref 26.6–33.0)
MCHC: 32.6 g/dL (ref 31.5–35.7)
MCV: 94 fL (ref 79–97)
Platelets: 307 10*3/uL (ref 150–450)
RBC: 3.95 x10E6/uL (ref 3.77–5.28)
RDW: 13 % (ref 11.7–15.4)
WBC: 9.3 10*3/uL (ref 3.4–10.8)

## 2022-04-05 LAB — VITAMIN D 25 HYDROXY (VIT D DEFICIENCY, FRACTURES): Vit D, 25-Hydroxy: 145 ng/mL — ABNORMAL HIGH (ref 30.0–100.0)

## 2022-04-08 NOTE — Progress Notes (Signed)
Cardiology Office Note:    Date:  04/13/2022   ID:  Kelsey Lopez, DOB 1940-06-15, MRN [id number]  PCP:  Unk Pinto, MD   [facility] Providers Cardiologist:  None     Referring MD: Unk Pinto, MD   Chief Complaint: follow-up atrial fibrillation  History of Present Illness:    Kelsey Lopez is a very pleasant 82 y.o. female with a hx of HTN, HLD, and new onset atrial fibrillation 02/2022.   Seen in 2011 by Dr. Harrington Challenger for dyspnea on exertion. Echo and stress at that time ok. Seen by PCP in December and January, found to be in atrial fibrillation. Placed on Eliquis.    Seen by Dr. Harrington Challenger 03/08/2022 she denied palpitations, dizziness, syncope, chest pain, fluttering. Denies history of sleep apnea. Following 4 weeks of anticoagulation, she was advised to undergo cardioversion which was successfully completed on 03/25/2022. 2D echo 03/30/2022 revealed normal LVEF 60 to 65%, no RWMA, mild LVH, grade 3 diastolic dysfunction, moderately elevated pulmonary artery systolic pressure, moderate MR with centrally directed jet, mild calcification of the aortic valve with no stenosis present, mild PR. Advised by Dr. Harrington Challenger, primary cardiologist, to have repeat limited echo which has been scheduled for March 2024.  Seen in cardiology clinic by me on 04/02/22. Has felt her heart racing today but at no other times since cardioversion. Monitors at home with BP monitor, no evidence of fast HR. Reports only discomfort she has is from chronic back pain and it is particularly uncomfortable right now. Is wearing a back brace,has degenerative scoliosis, and has run out of tramadol. States her lower legs have been swollen for as long as she can remember. EKG revealed SVT at 159 bpm which broke on its own while still connected to EKG. Subsequent tracing revealed sinus rhythm with frequent PACs at 83 bpm. She was advised to start metoprolol 25 mg once daily and return in 1-2 weeks for follow-up. No evidence of a  fib, continuing Eliquis 5 mg twice daily. Lab work which had been ordered previously was completed and revealed stable renal function and electrolytes as well as stable blood counts. BNP was mildly elevated, lipids overall looked good. Advised to stop vit D supplement due to elevated vit D level.  Today, she is here alone for follow-up and reports she is feeling well. No episodes of feeling her heart race, no palpitations. Has continued her regular activities, which is mostly playing bridge and visiting with friends. No concerns about current medications. No lightheadedness, chest discomfort, shortness of breath, PND, orthopnea, presyncope, syncope.  Past Medical History:  Diagnosis Date   Anxiety    Dyslipidemia    on Rx x20 years   HTN (hypertension)    Hyperlipidemia    Insomnia    Morton's neuroma of left foot    Multiple allergies    Osteoarthritis    Polycythemia    PVC (premature ventricular contraction)     Past Surgical History:  Procedure Laterality Date   ABDOMINAL HYSTERECTOMY     CARDIOVERSION N/A 03/25/2022   Procedure: CARDIOVERSION;  Surgeon: Sueanne Margarita, MD;  Location: [facility];  Service: Cardiovascular;  Laterality: N/A;   CATARACT EXTRACTION Right 2019   Dr. Ellie Lunch    KNEE ARTHROSCOPY     R   TONSILLECTOMY     VESICOVAGINAL FISTULA CLOSURE W/ TAH      Current Medications: Current Meds  Medication Sig   ALPRAZolam (XANAX) 0.5 MG tablet TAKE 1/2 -1 TABLET AT  BEDTIME ONLY IF NEEDED FOR SLEEP & LIMIT TO 5 DAYS /WEEK TO AVOID ADDICTION & DEMENTIA   apixaban (ELIQUIS) 5 MG TABS tablet Take  1 tablet  2 x /day (every 12 hr) to prevent Blood clots   cetirizine (ZYRTEC) 10 MG tablet Take 10 mg by mouth daily.   citalopram (CELEXA) 20 MG tablet Take  1 tablet  Daily for Mood                                                   /                                       TAKE                              BY                      MOUTH   diltiazem (CARTIA XT) 240 MG 24 hr  capsule Take 1 tablet Daily for  Heart Rhythm   Flaxseed, Linseed, (FLAX SEED OIL) 1000 MG CAPS Take 1,000 mg by mouth daily.   GUAIFENESIN ER PO Take 400 mg by mouth every morning. Mucinex   Magnesium 400 MG CAPS Take 400 mg by mouth daily.   meloxicam (MOBIC) 15 MG tablet Take 15 mg by mouth daily.   metoprolol succinate (TOPROL XL) 25 MG 24 hr tablet Take 1 tablet (25 mg total) by mouth daily.   montelukast (SINGULAIR) 10 MG tablet TAKE 1 TABLET BY MOUTH DAILY FOR ALLERGIES   Multiple Vitamin (MULTIVITAMIN) tablet Take 1 tablet by mouth daily.   NON FORMULARY Take 1,800 mg by mouth daily. Calcium 1800 mg   rosuvastatin (CRESTOR) 20 MG tablet Take 1 tablet (20 mg total) by mouth daily.   tiZANidine (ZANAFLEX) 4 MG tablet TAKE 1 TABLET (4 MG TOTAL) BY MOUTH EVERY 8 (EIGHT) HOURS AS NEEDED FOR MUSCLE SPASMS   traMADol (ULTRAM) 50 MG tablet Take 1-2 tablets (50-100 mg total) by mouth daily as needed.   traZODone (DESYREL) 150 MG tablet TAKE 1/2 TO 1 TABLET 1 HOUR BEFORE BEDTIME IF NEEDED FOR SLEEP (Patient taking differently: Take 150 mg by mouth at bedtime.)   vitamin C (ASCORBIC ACID) 500 MG tablet Take 500 mg by mouth daily.     Allergies:   Ace inhibitors, Ampicillin, Mevacor [lovastatin], Penicillins, and Sulfa antibiotics   Social History   Socioeconomic History   Marital status: Widowed    Spouse name: Not on file   Number of children: Not on file   Years of education: Not on file   Highest education level: Not on file  Occupational History   Not on file  Tobacco Use   Smoking status: Never   Smokeless tobacco: Never   Tobacco comments:    no tobacco   Substance and Sexual Activity   Alcohol use: Yes    Comment: rare   Drug use: Not on file   Sexual activity: Not on file  Other Topics Concern   Not on file  Social History Narrative   Married    Social Determinants of Health   Financial Resource Strain: Not on file  Food  Insecurity: Not on file  Transportation  Needs: Not on file  Physical Activity: Not on file  Stress: Not on file  Social Connections: Not on file     Family History: The patient's family history includes Cancer in her father; Diabetes in her brother; Heart attack in her mother; Heart disease in her mother; Hyperlipidemia in her brother; Osteoarthritis in her father and mother.  ROS:   Please see the history of present illness.   All other systems reviewed and are negative.  Labs/Other Studies Reviewed:    The following studies were reviewed today:  Echo 03/30/22 1. Left ventricular ejection fraction, by estimation, is 60 to 65%. The  left ventricle has normal function. The left ventricle has no regional  wall motion abnormalities. There is mild concentric left ventricular  hypertrophy. Left ventricular diastolic  parameters are consistent with Grade III diastolic dysfunction  (restrictive).   2. Right ventricular systolic function is normal. The right ventricular  size is normal. There is moderately elevated pulmonary artery systolic  pressure. The estimated right ventricular systolic pressure is 0000000 mmHg.   3. Left atrial size was severely dilated.   4. Right atrial size was mildly dilated.   5. The mitral valve is normal in structure. Moderate mitral valve  regurgitation. No evidence of mitral stenosis.   6. Tricuspid valve regurgitation is mild to moderate.   7. The aortic valve is tricuspid. There is mild calcification of the  aortic valve. Aortic valve regurgitation is not visualized. No aortic  stenosis is present.   8. The inferior vena cava is normal in size with <50% respiratory  variability, suggesting right atrial pressure of 8 mmHg.    Recent Labs: 02/02/2022: ALT 22; Magnesium 1.9; TSH 2.24 04/02/2022: BUN 9; Creatinine, Ser 0.84; Hemoglobin 12.1; NT-Pro BNP 735; Platelets 307; Potassium 3.5; Sodium 143  Recent Lipid Panel    Component Value Date/Time   CHOL 186 02/02/2022 1508   TRIG 167 (H)  02/02/2022 1508   HDL 75 02/02/2022 1508   CHOLHDL 2.5 02/02/2022 1508   VLDL 18 06/28/2016 1149   LDLCALC 84 02/02/2022 1508     Risk Assessment/Calculations:    CHA2DS2-VASc Score = 4  This indicates a 4.8% annual risk of stroke. The patient's score is based upon: CHF History: 0 HTN History: 1 Diabetes History: 0 Stroke History: 0 Vascular Disease History: 0 Age Score: 2 Gender Score: 1    Physical Exam:    VS:  BP 132/64   Pulse (!) 53   Ht 5' 4.5" (1.638 m)   Wt 150 lb 12.8 oz (68.4 kg)   SpO2 94%   BMI 25.49 kg/m     Wt Readings from Last 3 Encounters:  04/13/22 150 lb 12.8 oz (68.4 kg)  04/02/22 152 lb (68.9 kg)  03/25/22 155 lb (70.3 kg)     GEN:  Well nourished, well developed in no acute distress HEENT: Normal NECK: No JVD; No carotid bruits CARDIAC: RRR, no murmurs, rubs, gallops RESPIRATORY:  Clear to auscultation without rales, wheezing or rhonchi  ABDOMEN: Soft, non-tender, non-distended MUSCULOSKELETAL:  Non pitting bilateral LE edema; No deformity. 2+ pedal pulses, equal bilaterally SKIN: Warm and dry NEUROLOGIC:  Alert and oriented x 3 PSYCHIATRIC:  Normal affect   EKG:  EKG is ordered today.  EKG reveals sinus bradycardia at 53 bpm, nonspecific T wave abnormality   Diagnoses:    1. SVT (supraventricular tachycardia)   2. Chronic anticoagulation   3. PAF (paroxysmal atrial fibrillation) (  [place])   4. Chronic heart failure with preserved ejection fraction (HFpEF) ([place])   5. Hyperlipidemia LDL goal <100     Assessment and Plan:     SVT: At office visit on 04/02/22, EKG revealed SVT at 159 bpm with evidence of slowing on the same EKG. EKG repeated 30-40 minutes later revealed NSR at 83 bpm. Metoprolol 25 mg once daily was added. EKG today reveals sinus bradycardia. She is feeling well on current medication regimen with no further episodes of palpitations or tachycardia. Continue metoprolol, diltiazem.   PAF on chronic anticoagulation:  Successful DCCV on 03/24/2022. Remains in sinus rhythm on EKG today. No missed doses of Eliquis. No bleeding concerns. No evidence on EKG of return of a fib. Continue Eliquis 5 mg twice daily which is appropriate dose. Continue metoprolol and diltiazem.   HFpEF: Normal LV function, grade 3 diastolic dysfunction on echo 03/30/22. Reports chronic LE edema that has improved recently. No dyspnea, shortness of breath, orthopnea, PND. Weight is stable. Appears euvolemic on exam. Has limited echo  3/20 to further evaluate diastolic dysfunction. Admits she is not very physically active, limited by back pain. Encouraged low-sodium, heart healthy diet. Monitor daily weight.   Hypertension: BP is initially elevated today but improved on my recheck. Asked her to monitor BP on a consistent basis and notify us if > 140/80.  Hyperlipidemia LDL goal < 100: NMR profile 04/02/22 with LDL, triglycerides, HDL at goal. Continue rosuvastatin.     Disposition: 3-4 months with Dr. Harrington Challenger  Medication Adjustments/Labs and Tests Ordered: Current medicines are reviewed at length with the patient today.  Concerns regarding medicines are outlined above.  No orders of the defined types were placed in this encounter.  No orders of the defined types were placed in this encounter.   Patient Instructions  Medication Instructions:   Your physician recommends that you continue on your current medications as directed. Please refer to the Current Medication list given to you today.   *If you need a refill on your cardiac medications before your next appointment, please call your pharmacy*   Lab Work:  None ordered.  If you have labs (blood work) drawn today and your tests are completely normal, you will receive your results only by: [place] (if you have MyChart) OR A paper copy in the mail If you have any lab test that is abnormal or we need to change your treatment, we will call you to review the  results.   Testing/Procedures:  Keep your echo appointment on Wednesday, March 20.   Follow-Up: At [facility], you and your health needs are our priority.  As part of our continuing mission to provide you with exceptional heart care, we have created designated Provider Care Teams.  These Care Teams include your primary Cardiologist (physician) and Advanced Practice Providers (APPs -  Physician Assistants and Nurse Practitioners) who all work together to provide you with the care you need, when you need it.  We recommend signing up for the patient portal called "MyChart".  Sign up information is provided on this After Visit Summary.  MyChart is used to connect with patients for Virtual Visits (Telemedicine).  Patients are able to view lab/test results, encounter notes, upcoming appointments, etc.  Non-urgent messages can be sent to your provider as well.   To learn more about what you can do with MyChart, go to [email or web address].    Your next appointment: 3 month(s)  Provider:   Dorris Carnes, MD  Signed, Emmaline Life, NP  04/13/2022 4:28 PM    [place]

## 2022-04-09 ENCOUNTER — Ambulatory Visit: Payer: Medicare Other | Admitting: Cardiovascular Disease

## 2022-04-09 ENCOUNTER — Other Ambulatory Visit: Payer: Self-pay | Admitting: Orthopaedic Surgery

## 2022-04-09 MED ORDER — TRAMADOL HCL 50 MG PO TABS: 50.0000 mg | ORAL_TABLET | Freq: Every day | ORAL | 0 refills | Status: DC | PRN

## 2022-04-13 ENCOUNTER — Ambulatory Visit: Payer: Medicare Other | Attending: Nurse Practitioner | Admitting: Nurse Practitioner

## 2022-04-13 ENCOUNTER — Encounter: Payer: Self-pay | Admitting: Nurse Practitioner

## 2022-04-13 VITALS — BP 132/64 | HR 53 | Ht 64.5 in | Wt 150.8 lb

## 2022-04-13 DIAGNOSIS — E785 Hyperlipidemia, unspecified: Secondary | ICD-10-CM | POA: Diagnosis not present

## 2022-04-13 DIAGNOSIS — Z7901 Long term (current) use of anticoagulants: Secondary | ICD-10-CM

## 2022-04-13 DIAGNOSIS — I480 Paroxysmal atrial fibrillation: Secondary | ICD-10-CM | POA: Diagnosis not present

## 2022-04-13 DIAGNOSIS — I471 Supraventricular tachycardia, unspecified: Secondary | ICD-10-CM | POA: Diagnosis not present

## 2022-04-13 DIAGNOSIS — I5032 Chronic diastolic (congestive) heart failure: Secondary | ICD-10-CM

## 2022-04-13 NOTE — Patient Instructions (Signed)
Medication Instructions:   Your physician recommends that you continue on your current medications as directed. Please refer to the Current Medication list given to you today.   *If you need a refill on your cardiac medications before your next appointment, please call your pharmacy*   Lab Work:  None ordered.  If you have labs (blood work) drawn today and your tests are completely normal, you will receive your results only by: [place] (if you have MyChart) OR A paper copy in the mail If you have any lab test that is abnormal or we need to change your treatment, we will call you to review the results.   Testing/Procedures:  Keep your echo appointment on Wednesday, March 20.   Follow-Up: At [facility], you and your health needs are our priority.  As part of our continuing mission to provide you with exceptional heart care, we have created designated Provider Care Teams.  These Care Teams include your primary Cardiologist (physician) and Advanced Practice Providers (APPs -  Physician Assistants and Nurse Practitioners) who all work together to provide you with the care you need, when you need it.  We recommend signing up for the patient portal called "MyChart".  Sign up information is provided on this After Visit Summary.  MyChart is used to connect with patients for Virtual Visits (Telemedicine).  Patients are able to view lab/test results, encounter notes, upcoming appointments, etc.  Non-urgent messages can be sent to your provider as well.   To learn more about what you can do with MyChart, go to [email or web address].    Your next appointment: 3 month(s)  Provider:   Dorris Carnes, MD

## 2022-04-14 NOTE — Addendum Note (Signed)
Addended by: Maryan Rued on: 04/14/2022 09:37 AM   Modules accepted: Orders

## 2022-04-28 ENCOUNTER — Ambulatory Visit (HOSPITAL_COMMUNITY): Payer: Medicare Other | Attending: Cardiovascular Disease

## 2022-04-28 DIAGNOSIS — I4891 Unspecified atrial fibrillation: Secondary | ICD-10-CM | POA: Insufficient documentation

## 2022-04-28 DIAGNOSIS — I10 Essential (primary) hypertension: Secondary | ICD-10-CM | POA: Insufficient documentation

## 2022-04-28 DIAGNOSIS — Z79899 Other long term (current) drug therapy: Secondary | ICD-10-CM

## 2022-04-28 LAB — ECHOCARDIOGRAM LIMITED
Area-P 1/2: 5.13 cm2
MV M vel: 4.24 m/s
MV Peak grad: 71.9 mmHg
S' Lateral: 2.8 cm

## 2022-05-07 ENCOUNTER — Other Ambulatory Visit: Payer: Self-pay | Admitting: Nurse Practitioner

## 2022-05-07 ENCOUNTER — Other Ambulatory Visit: Payer: Self-pay | Admitting: Internal Medicine

## 2022-05-07 DIAGNOSIS — M159 Polyosteoarthritis, unspecified: Secondary | ICD-10-CM

## 2022-05-07 DIAGNOSIS — F419 Anxiety disorder, unspecified: Secondary | ICD-10-CM

## 2022-05-13 ENCOUNTER — Ambulatory Visit: Payer: Medicare Other | Admitting: Nurse Practitioner

## 2022-05-17 ENCOUNTER — Encounter: Payer: Self-pay | Admitting: Nurse Practitioner

## 2022-05-17 ENCOUNTER — Ambulatory Visit (INDEPENDENT_AMBULATORY_CARE_PROVIDER_SITE_OTHER): Payer: Medicare Other | Admitting: Nurse Practitioner

## 2022-05-17 VITALS — BP 148/80 | HR 54 | Temp 100.0°F | Ht 65.0 in | Wt 151.0 lb

## 2022-05-17 DIAGNOSIS — E782 Mixed hyperlipidemia: Secondary | ICD-10-CM | POA: Diagnosis not present

## 2022-05-17 DIAGNOSIS — F325 Major depressive disorder, single episode, in full remission: Secondary | ICD-10-CM | POA: Diagnosis not present

## 2022-05-17 DIAGNOSIS — R7309 Other abnormal glucose: Secondary | ICD-10-CM | POA: Diagnosis not present

## 2022-05-17 DIAGNOSIS — M8589 Other specified disorders of bone density and structure, multiple sites: Secondary | ICD-10-CM

## 2022-05-17 DIAGNOSIS — I471 Supraventricular tachycardia, unspecified: Secondary | ICD-10-CM

## 2022-05-17 DIAGNOSIS — E663 Overweight: Secondary | ICD-10-CM

## 2022-05-17 DIAGNOSIS — N182 Chronic kidney disease, stage 2 (mild): Secondary | ICD-10-CM | POA: Diagnosis not present

## 2022-05-17 DIAGNOSIS — M1990 Unspecified osteoarthritis, unspecified site: Secondary | ICD-10-CM

## 2022-05-17 DIAGNOSIS — Z0000 Encounter for general adult medical examination without abnormal findings: Secondary | ICD-10-CM

## 2022-05-17 DIAGNOSIS — I4819 Other persistent atrial fibrillation: Secondary | ICD-10-CM

## 2022-05-17 DIAGNOSIS — G4700 Insomnia, unspecified: Secondary | ICD-10-CM | POA: Diagnosis not present

## 2022-05-17 DIAGNOSIS — Z79899 Other long term (current) drug therapy: Secondary | ICD-10-CM

## 2022-05-17 DIAGNOSIS — I10 Essential (primary) hypertension: Secondary | ICD-10-CM | POA: Diagnosis not present

## 2022-05-17 DIAGNOSIS — E559 Vitamin D deficiency, unspecified: Secondary | ICD-10-CM

## 2022-05-17 NOTE — Progress Notes (Signed)
MEDICARE ANNUAL WELLNESS VISIT AND 3 month  Assessment:    Encounter for Annual Physical Exam with abnormal findings Due annually  Health Maintenance reviewed Healthy lifestyle reviewed and goals set  Essential hypertension Discussed DASH (Dietary Approaches to Stop Hypertension) DASH diet is lower in sodium than a typical American diet. Cut back on foods that are high in saturated fat, cholesterol, and trans fats. Eat more whole-grain foods, fish, poultry, and nuts Remain active and exercise as tolerated daily.  Monitor BP at home-Call if greater than 130/80.  Check CMP/CBC   Other abnormal glucose Education: Reviewed 'ABCs' of diabetes management  Discussed goals to be met and/or maintained include A1C (<7) Blood pressure (<130/80) Cholesterol (LDL <70) Continue Eye Exam yearly  Continue Dental Exam Q6 mo Discussed dietary recommendations Discussed Physical Activity recommendations Foot exam UTD Check A1C  Mixed hyperlipidemia Discussed lifestyle modifications. Recommended diet heavy in fruits and veggies, omega 3's. Decrease consumption of animal meats, cheeses, and dairy products. Remain active and exercise as tolerated. Continue to monitor. Check lipids/TSH  Depression/ Anxiety Continue medications.   Reviewed relaxation techniques.  Sleep hygiene. Recommended Cognitive Behavioral Therapy (CBT). Recommended mindfulness meditation and exercise.   Insight-oriented psychotherapy given for 16 minutes exclusively. Psychoeducation:  encouraged personality growth wand development through coping techniques and problem-solving skills. Limit/Decrease/Monitor drug/alcohol intake.    Medication management All medications discussed and reviewed in full. All questions and concerns regarding medications addressed.    Vitamin D deficiency Above goal Adjust supplement based on pending reading. Monitor Vitamin D levels  Osteoarthritis, unspecified osteoarthritis type,  unspecified site RICE, NSAIDS, followed by Dr. Roda ShuttersXu  OVERWEIGHT Discussed appropriate BMI Diet modification. Physical activity. Encouraged/praised to build confidence.  Insomnia Discussed good sleep hygiene. Establish bed and wake times. Sleep restriction-only sleep estimated hrs sleep. Bed only for sex and sleep, only sleep when sleepy, out of bed if anxious (stimulus control). Reviewed relaxation techniques, mindful meditations. Expected sleep duration. Addressed worries about not sleeping.   Stage 2 CKD Discussed how what you eat and drink can aide in kidney protection. Stay well hydrated. Avoid high salt foods. Avoid NSAIDS. Keep BP and BG well controlled.   Take medications as prescribed. Remain active and exercise as tolerated daily. Maintain weight.  Continue to monitor. Check CMP/GFR/Microablumin  Osteopenia  Pursue a combination of weight-bearing exercises and strength training. Patients with severe mobility impairment should be referred for physical therapy. Advised on fall prevention measures including proper lighting in all rooms, removal of area rugs and floor clutter, use of walking devices as deemed appropriate, avoidance of uneven walking surfaces. Smoking cessation and moderate alcohol consumption if applicable Consume 800 to 1000 IU of vitamin D daily with a goal vitamin D serum value of 30 ng/mL or higher. Aim for 1000 to 1200 mg of elemental calcium daily through supplements and/or dietary sources. SVT (HCC)/atrial fib Continue Eliquis Cardiology managing Report to the ER if any chest pain, shortness of breath, nausea, dizziness, severe HA, changes vision/speech  Orders Placed This Encounter  Procedures   DG Bone Density    Standing Status:   Future    Standing Expiration Date:   05/17/2023    Order Specific Question:   Reason for Exam (SYMPTOM  OR DIAGNOSIS REQUIRED)    Answer:   Osteopenia    Order Specific Question:   Preferred imaging location?     Answer:   [facility]   VITAMIN D 25 Hydroxy (Vit-D Deficiency, Fractures)    Notify office for further  evaluation and treatment, questions or concerns if any reported s/s fail to improve.   The patient was advised to call back or seek an in-person evaluation if any symptoms worsen or if the condition fails to improve as anticipated.   Further disposition pending results of labs. Discussed med's effects and SE's.    I discussed the assessment and treatment plan with the patient. The patient was provided an opportunity to ask questions and all were answered. The patient agreed with the plan and demonstrated an understanding of the instructions.  Discussed med's effects and SE's. Screening labs and tests as requested with regular follow-up as recommended.  I provided 35 minutes of face-to-face time during this encounter including counseling, chart review, and critical decision making was preformed.  Today's Plan of Care is based on a patient-centered health care approach known as shared decision making - the decisions, tests and treatments allow for patient preferences and values to be balanced with clinical evidence.    Future Appointments  Date Time Provider Department Center  06/25/2022  2:00 PM Pricilla Riffle, MD [facility]  08/06/2022  3:20 PM Pricilla Riffle, MD [facility]  08/16/2022  2:30 PM Lucky Cowboy, MD [facility] None  11/17/2022 10:30 AM Adela Glimpse, NP [facility] None  02/21/2023 10:00 AM Lucky Cowboy, MD [facility] None  05/23/2023  4:00 PM Adela Glimpse, NP [facility] None      Plan:   During the course of the visit the patient was educated and counseled about appropriate screening and preventive services including:   Pneumococcal vaccine  Influenza vaccine Td vaccine Screening electrocardiogram Screening mammography Bone densitometry screening Colorectal cancer screening Diabetes screening Glaucoma screening Nutrition  counseling  Advanced directives: given information/requested   Subjective:   Kelsey Lopez is a 82 y.o. female who presents for Medicare Annual Wellness Visit and 3 month follow up. She has Hyperlipidemia, mixed; Essential hypertension; Anxiety; Osteoarthritis; Abnormal glucose; Medication management; Vitamin D deficiency; Overweight (BMI 25.0-29.9); Insomnia; Depression, major, in remission; CKD (chronic kidney disease) stage 2, GFR 60-89 ml/min; Unilateral primary osteoarthritis, left knee; Unilateral primary osteoarthritis, right knee; Osteopenia; SVT (supraventricular tachycardia); High risk medication use; Elevated LFTs; and Persistent atrial fibrillation on their problem list.  Overall she reports feeling well today.  She has no new or additional concerns at this time.    She does have a hx of SVT and persistent atrial fibrillation.  Currently on Eliquis 5 mg BID.  Follows with Cardiology, last seen 04/13/2022. She is to continue current medication regimen.  Remains in sinus rhythm on EKG.  She has hx of second hand smoke exposure, prone to bronchitis, normal CXRs, no problems since on Singulair, zyrtec and mucinex.   She is on celexa for anxiety, started after her husband passed and continues to perceive benefit, she is on xanax PRN (takes 1/2 tab 3-4 days a week  if very stressed) and she is on trazodone 150 mg daily at night. Very involved in church.   Following with Dr. Roda Shutters for knee pain, bone on bone, also degenerative scoliosis, did see neurosurgery but doesn't want surgery, PT helped and doing well with occasional injections, just had last week and currently pain is very well controlled. Manges with heat, rest, Meloxicam, occasional PRN via ortho.   BMI is Body mass index is 25.13 kg/m., she has been working on diet and exercise. Wt Readings from Last 3 Encounters:  05/17/22 151 lb (68.5 kg)  04/13/22 150 lb 12.8 oz (68.4 kg)  04/02/22  152 lb (68.9 kg)   She admits hasn't been  checking BPs at home, does have cuff, BP was also elevated at last 2 visits, previously well controlled, today their BP is BP: (!) 148/80. Currently taking losartan ? 25 mg per patient, script states 50 mg, also HCTZ 12.5. Was previously prescribed 100 mg/25 mg but was reduced at some point. She notes was having more pain/stress until this week.  She does not workout. She denies chest pain, shortness of breath, dizziness.   She is on cholesterol medication (simvastatin 40 mg daily) and denies myalgias. Her cholesterol is at goal. The cholesterol last visit was:   Lab Results  Component Value Date   CHOL 186 02/02/2022   HDL 75 02/02/2022   LDLCALC 84 02/02/2022   TRIG 167 (H) 02/02/2022   CHOLHDL 2.5 02/02/2022   Last Z6X in the office was:  Lab Results  Component Value Date   HGBA1C 5.5 02/02/2022    She has CKD II monitored at this office; last GFR:  Lab Results  Component Value Date   EGFR 70 04/02/2022   Patient is on Vitamin D supplement, 2000 IU.   Lab Results  Component Value Date   VD25OH 145.0 (H) 04/02/2022      Medication Review   Current Outpatient Medications (Cardiovascular):    diltiazem (CARTIA XT) 240 MG 24 hr capsule, Take 1 tablet Daily for  Heart Rhythm   metoprolol succinate (TOPROL XL) 25 MG 24 hr tablet, Take 1 tablet (25 mg total) by mouth daily.   rosuvastatin (CRESTOR) 20 MG tablet, Take 1 tablet (20 mg total) by mouth daily.  Current Outpatient Medications (Respiratory):    cetirizine (ZYRTEC) 10 MG tablet, Take 10 mg by mouth daily.   GUAIFENESIN ER PO, Take 400 mg by mouth every morning. Mucinex   montelukast (SINGULAIR) 10 MG tablet, TAKE 1 TABLET BY MOUTH DAILY FOR ALLERGIES  Current Outpatient Medications (Analgesics):    meloxicam (MOBIC) 15 MG tablet, TAKE 1/2 TO 1 TABLET DAILY WITH FOOD FOR PAIN & INFLAMMATION   traMADol (ULTRAM) 50 MG tablet, Take 1-2 tablets (50-100 mg total) by mouth daily as needed.  Current Outpatient Medications  (Hematological):    apixaban (ELIQUIS) 5 MG TABS tablet, Take  1 tablet  2 x /day (every 12 hr) to prevent Blood clots  Current Outpatient Medications (Other):    ALPRAZolam (XANAX) 0.5 MG tablet, TAKE 1/2 -1 TABLET AT BEDTIME ONLY IF NEEDED FOR SLEEP & LIMIT TO 5 DAYS /WEEK TO AVOID ADDICTION & DEMENTIA   citalopram (CELEXA) 20 MG tablet, TAKE 1 TABLET  EVERY DAY FOR MOOD   Flaxseed, Linseed, (FLAX SEED OIL) 1000 MG CAPS, Take 1,000 mg by mouth daily.   Magnesium 400 MG CAPS, Take 400 mg by mouth daily.   Multiple Vitamin (MULTIVITAMIN) tablet, Take 1 tablet by mouth daily.   NON FORMULARY, Take 1,800 mg by mouth daily. Calcium 1800 mg   tiZANidine (ZANAFLEX) 4 MG tablet, TAKE 1 TABLET (4 MG TOTAL) BY MOUTH EVERY 8 (EIGHT) HOURS AS NEEDED FOR MUSCLE SPASMS   traZODone (DESYREL) 150 MG tablet, TAKE 1/2 TO 1 TABLET 1 HOUR BEFORE BEDTIME IF NEEDED FOR SLEEP (Patient taking differently: Take 150 mg by mouth at bedtime.)   vitamin C (ASCORBIC ACID) 500 MG tablet, Take 500 mg by mouth daily.  Current Problems (verified) Patient Active Problem List   Diagnosis Date Noted   Persistent atrial fibrillation 03/25/2022   Elevated LFTs 05/13/2021   High risk  medication use 05/12/2021   SVT (supraventricular tachycardia) 07/12/2020   Osteopenia 04/15/2020   Unilateral primary osteoarthritis, left knee 05/04/2019   Unilateral primary osteoarthritis, right knee 05/04/2019   CKD (chronic kidney disease) stage 2, GFR 60-89 ml/min 01/14/2017   Depression, major, in remission 11/27/2014   Overweight (BMI 25.0-29.9) 04/10/2014   Insomnia 04/10/2014   Vitamin D deficiency 01/07/2014   Abnormal glucose 01/03/2014   Medication management 01/03/2014   Anxiety    Osteoarthritis    Hyperlipidemia, mixed 06/05/2009   Essential hypertension 06/05/2009    Immunization History  Administered Date(s) Administered   Influenza Split 11/15/2011, 11/28/2012, 10/31/2013, 01/07/2014   Influenza, High Dose Seasonal  PF 11/27/2014, 11/28/2015, 09/29/2016, 10/30/2017, 10/01/2018, 10/23/2019, 10/14/2020, 11/25/2021   Influenza-Unspecified 09/29/2016, 10/01/2018   PFIZER Comirnaty(Gray Top)Covid-19 Tri-Sucrose Vaccine 05/12/2020   PFIZER(Purple Top)SARS-COV-2 Vaccination 03/02/2019, 03/23/2019, 11/20/2019   Pfizer Covid-19 Vaccine Bivalent Booster 6yrs & up 12/01/2020, 06/11/2021, 12/01/2021   Pneumococcal Conjugate-13 10/02/2013   Pneumococcal Polysaccharide-23 08/03/2017   Respiratory Syncytial Virus Vaccine,Recomb Aduvanted(Arexvy) 12/01/2021   Tdap 08/01/2013   Zoster Recombinat (Shingrix) 11/10/2016, 01/10/2017   Zoster, Live 05/25/2011   Health Maintenance  Topic Date Due   DEXA SCAN  12/26/2021   COVID-19 Vaccine (8 - 2023-24 season) 01/26/2022   MAMMOGRAM  06/03/2022   INFLUENZA VACCINE  09/09/2022   Medicare Annual Wellness (AWV)  05/17/2023   DTaP/Tdap/Td (2 - Td or Tdap) 08/02/2023   Pneumonia Vaccine 4+ Years old  Completed   Zoster Vaccines- Shingrix  Completed   HPV VACCINES  Aged Out   Last colonoscopy: 03/08/2018 DONE  Last mammogram: 05/2021 - scheduled for 06/08/22 Last pap smear/pelvic exam: 2007, DONE DEXA: 12/2019, L fem T - 2.3, Due - ordered   Dr. Charlotte Sanes, (Eye), last visit 2023 Dr. Irving Copas, (Dentist), 2024, goes q6m  Patient Care Team: Lucky Cowboy, MD as PCP - General (Internal Medicine) Cherlyn Roberts, MD as Consulting Physician (Dermatology) Vida Rigger, MD as Consulting Physician (Gastroenterology) Pricilla Riffle, MD as Consulting Physician (Cardiology) Tarry Kos, MD as Attending Physician (Orthopedic Surgery)   Allergies Allergies  Allergen Reactions   Ace Inhibitors     Cough   Ampicillin     rash   Mevacor [Lovastatin]     Elevated LFT's   Penicillins     REACTION: hives and  itching   Sulfa Antibiotics     rash    SURGICAL HISTORY She  has a past surgical history that includes Tonsillectomy; Vesicovaginal fistula closure w/ TAH; Knee  arthroscopy; Abdominal hysterectomy; Cataract extraction (Right, 2019); and Cardioversion (N/A, 03/25/2022). FAMILY HISTORY Her family history includes Cancer in her father; Diabetes in her brother; Heart attack in her mother; Heart disease in her mother; Hyperlipidemia in her brother; Osteoarthritis in her father and mother. SOCIAL HISTORY She  reports that she has never smoked. She has never used smokeless tobacco. She reports current alcohol use.  Physical activity: Current Exercise Habits: Home exercise routine Cardiac risk factors:   Depression/mood screen:      05/17/2022    2:58 PM  Depression screen PHQ 2/9  Decreased Interest 0  Down, Depressed, Hopeless 0  PHQ - 2 Score 0    ADLs:     05/17/2022    2:53 PM 02/02/2022   12:15 AM  In your present state of health, do you have any difficulty performing the following activities:  Hearing? 0 0  Vision? 0 0  Difficulty concentrating or making decisions? 0 0  Walking or climbing  stairs? 0 0  Dressing or bathing? 0 0  Doing errands, shopping? 0 0  Preparing Food and eating ? N   Using the Toilet? N   In the past six months, have you accidently leaked urine? N   Do you have problems with loss of bowel control? N   Managing your Medications? N   Managing your Finances? N   Housekeeping or managing your Housekeeping? N      Cognitive Testing  Alert? Yes  Normal Appearance?Yes  Oriented to person? Yes  Place? Yes   Time? Yes  Recall of three objects?  Yes  Can perform simple calculations? Yes  Displays appropriate judgment?Yes  Can read the correct time from a watch face?Yes  EOL planning: Does Patient Have a Medical Advance Directive?: Yes Type of Advance Directive: Living will   Objective:     Blood pressure (!) 148/80, pulse (!) 54, temperature 100 F (37.8 C), height 5\' 5"  (1.651 m), weight 151 lb (68.5 kg), SpO2 98 %. Body mass index is 25.13 kg/m.  General appearance: alert, no distress, WD/WN,  female HEENT:  normocephalic, sclerae anicteric, TMs pearly, nares patent, no discharge or erythema, pharynx normal Oral cavity: MMM, no lesions Neck: supple, no lymphadenopathy, no thyromegaly, no masses Heart: RRR, normal S1, S2, no murmurs Lungs: CTA bilaterally, no wheezes, rhonchi, or rales Abdomen: +bs, soft, non tender, non distended, no masses, no hepatomegaly, no splenomegaly Musculoskeletal: nontender, no swelling, mild scoliosis Extremities: no edema, no cyanosis, no clubbing Pulses: 2+ symmetric, upper and lower extremities, normal cap refill Neurological: alert, oriented x 3, CN2-12 intact, strength normal upper extremities and lower extremities, sensation normal throughout, DTRs 2+ throughout, no cerebellar signs, gait slow steady Skin: erythematous rash around bilateral eyes Psychiatric: normal affect, behavior normal, pleasant  Breast:  defer Rectal: defer   Medicare Attestation I have personally reviewed: The patient's medical and social history Their use of alcohol, tobacco or illicit drugs Their current medications and supplements The patient's functional ability including ADLs,fall risks, home safety risks, cognitive, and hearing and visual impairment Diet and physical activities Evidence for depression or mood disorders  The patient's weight, height, BMI, and visual acuity have been recorded in the chart.  I have made referrals, counseling, and provided education to the patient based on review of the above and I have provided the patient with a written personalized care plan for preventive services.     Adela Glimpse, NP   05/17/2022

## 2022-05-17 NOTE — Patient Instructions (Signed)

## 2022-05-18 LAB — VITAMIN D 25 HYDROXY (VIT D DEFICIENCY, FRACTURES): Vit D, 25-Hydroxy: 98 ng/mL (ref 30–100)

## 2022-05-19 ENCOUNTER — Other Ambulatory Visit: Payer: Self-pay | Admitting: Nurse Practitioner

## 2022-05-19 DIAGNOSIS — F419 Anxiety disorder, unspecified: Secondary | ICD-10-CM

## 2022-06-02 ENCOUNTER — Other Ambulatory Visit: Payer: Self-pay | Admitting: Physician Assistant

## 2022-06-08 DIAGNOSIS — Z1231 Encounter for screening mammogram for malignant neoplasm of breast: Secondary | ICD-10-CM | POA: Diagnosis not present

## 2022-06-21 ENCOUNTER — Other Ambulatory Visit: Payer: Self-pay | Admitting: Internal Medicine

## 2022-06-24 NOTE — Progress Notes (Signed)
Cardiology Office Note   Date:  06/25/2022   ID:  Kelsey Lopez 11-Jul-1940, MRN [id number]  PCP:  Lucky Cowboy, MD  Cardiologist:   Dietrich Pates, MD   Patient presents for evaluation of atrial fibrillation     History of Present Illness: Kelsey Lopez is a 82 y.o. female with a history of HTN, HL, prediabetes, followed by Dr Oneta Rack   In Jan 2024 she was found to be in afib   Placed on Eliquis       I saw the pt in jan 2024  for afib   She underwent DCCV in Feb  Seen by Benjamine Sprague in march    SR at time    Since seen she hs done OK  Breathing is OK   No CP  No palpitations    Current Meds  Medication Sig   ALPRAZolam (XANAX) 0.5 MG tablet 1/2-1 TABLET AT BEDTIME ONLY IF NEEDED FOR SLEEP. LIMIT 5 DAYS/WEEK TO AVOID ADDICTION & DEMENTIA   apixaban (ELIQUIS) 5 MG TABS tablet Take  1 tablet  2 x /day (every 12 hr) to prevent Blood clots   cetirizine (ZYRTEC) 10 MG tablet Take 10 mg by mouth daily.   citalopram (CELEXA) 20 MG tablet TAKE 1 TABLET  EVERY DAY FOR MOOD   diltiazem (CARTIA XT) 240 MG 24 hr capsule Take 1 tablet Daily for  Heart Rhythm   Flaxseed, Linseed, (FLAX SEED OIL) 1000 MG CAPS Take 1,000 mg by mouth daily.   GUAIFENESIN ER PO Take 400 mg by mouth every morning. Mucinex   Magnesium 400 MG CAPS Take 400 mg by mouth daily.   meloxicam (MOBIC) 15 MG tablet TAKE 1/2 TO 1 TABLET DAILY WITH FOOD FOR PAIN & INFLAMMATION   metoprolol succinate (TOPROL XL) 25 MG 24 hr tablet Take 1 tablet (25 mg total) by mouth daily.   montelukast (SINGULAIR) 10 MG tablet TAKE 1 TABLET BY MOUTH DAILY FOR ALLERGIES   Multiple Vitamin (MULTIVITAMIN) tablet Take 1 tablet by mouth daily.   NON FORMULARY Take 1,800 mg by mouth daily. Calcium 1800 mg   rosuvastatin (CRESTOR) 20 MG tablet Take 1 tablet (20 mg total) by mouth daily.   tiZANidine (ZANAFLEX) 4 MG tablet TAKE 1 TABLET (4 MG TOTAL) BY MOUTH EVERY 8 (EIGHT) HOURS AS NEEDED FOR MUSCLE SPASMS   traMADol (ULTRAM) 50 MG tablet  Take 1-2 tablets (50-100 mg total) by mouth daily as needed.   traZODone (DESYREL) 150 MG tablet TAKE 1/2 TO 1 TABLET 1 HOUR BEFORE BEDTIME IF NEEDED FOR SLEEP (Patient taking differently: Take 150 mg by mouth at bedtime.)   vitamin C (ASCORBIC ACID) 500 MG tablet Take 500 mg by mouth daily.     Allergies:   Ace inhibitors, Ampicillin, Mevacor [lovastatin], Penicillins, and Sulfa antibiotics   Past Medical History:  Diagnosis Date   Anxiety    Dyslipidemia    on Rx x20 years   HTN (hypertension)    Hyperlipidemia    Insomnia    Morton's neuroma of left foot    Multiple allergies    Osteoarthritis    Polycythemia    PVC (premature ventricular contraction)     Past Surgical History:  Procedure Laterality Date   ABDOMINAL HYSTERECTOMY     CARDIOVERSION N/A 03/25/2022   Procedure: CARDIOVERSION;  Surgeon: Quintella Reichert, MD;  Location: [facility];  Service: Cardiovascular;  Laterality: N/A;   CATARACT EXTRACTION Right 2019   Dr. Charlotte Sanes  KNEE ARTHROSCOPY     R   TONSILLECTOMY     VESICOVAGINAL FISTULA CLOSURE W/ TAH       Social History:  The patient  reports that she has never smoked. She has never used smokeless tobacco. She reports current alcohol use.   Family History:  The patient's family history includes Cancer in her father; Diabetes in her brother; Heart attack in her mother; Heart disease in her mother; Hyperlipidemia in her brother; Osteoarthritis in her father and mother.    ROS:  Please see the history of present illness. All other systems are reviewed and  Negative to the above problem except as noted.    PHYSICAL EXAM: VS:  BP 134/70   Pulse (!) 53   Ht 5\' 5"  (1.651 m)   Wt 148 lb (67.1 kg)   SpO2 97%   BMI 24.63 kg/m   GEN: Well nourished, well developed,in NAD  HEENT: normal  Neck: no JVD, no  bruits  Cardiac: RRR.  No S3  No murmurs   Mild lipedema Respiratory:  clear to auscultation GI: soft, nontender, No hepatomegaly   EKG:  EKG is not  ordered today   Echo June 02, 2022    1. Left ventricular ejection fraction by 3D volume is 62 %. The left  ventricle has no regional wall motion abnormalities. Left ventricular  diastolic parameters were normal. The average left ventricular global  longitudinal strain is -17.9 %. The global  longitudinal strain is at the lower limit of normal.   2. Right ventricular systolic function is normal. The right ventricular  size is normal. There is normal pulmonary artery systolic pressure.   3. Left atrial size was severely dilated.   4. The mitral valve is myxomatous. Moderate mitral valve regurgitation.  There is mild late systolic prolapse of both leaflets of the mitral valve.   5. The aortic valve is tricuspid. Aortic valve regurgitation is trivial.  No aortic stenosis is present.   6. The inferior vena cava is normal in size with greater than 50%  respiratory variability, suggesting right atrial pressure of 3 mmHg.   7. The markedly increased E/A ratio is likely related to atrial  mechanical failure or stunning after a recent episode of atrial  fibrillation. the diastolic mitral annulus velocities suggest normal LV  diastolic function.   Feb 2024   1. Left ventricular ejection fraction, by estimation, is 60 to 65%. The  left ventricle has normal function. The left ventricle has no regional  wall motion abnormalities. There is mild concentric left ventricular  hypertrophy. Left ventricular diastolic  parameters are consistent with Grade III diastolic dysfunction  (restrictive).   2. Right ventricular systolic function is normal. The right ventricular  size is normal. There is moderately elevated pulmonary artery systolic  pressure. The estimated right ventricular systolic pressure is 54.0 mmHg.   3. Left atrial size was severely dilated.   4. Right atrial size was mildly dilated.   5. The mitral valve is normal in structure. Moderate mitral valve  regurgitation. No evidence of  mitral stenosis.   6. Tricuspid valve regurgitation is mild to moderate.   7. The aortic valve is tricuspid. There is mild calcification of the  aortic valve. Aortic valve regurgitation is not visualized. No aortic  stenosis is present.   8. The inferior vena cava is normal in size with <50% respiratory  variability, suggesting right atrial pressure of 8 mmHg.    Lipid Panel    Component  Value Date/Time   CHOL 186 02/02/2022 1508   TRIG 167 (H) 02/02/2022 1508   HDL 75 02/02/2022 1508   CHOLHDL 2.5 02/02/2022 1508   VLDL 18 06/28/2016 1149   LDLCALC 84 02/02/2022 1508      Wt Readings from Last 3 Encounters:  06/25/22 148 lb (67.1 kg)  05/17/22 151 lb (68.5 kg)  04/13/22 150 lb 12.8 oz (68.4 kg)      ASSESSMENT AND PLAN:  1  Atrial fib  Pt found to be in afib in Jan 2024  Underwent DCCV   CLinically in SR P 53    Follow   Keep on Eliquis    She is on Diltiazem and Toprol XL   Will get 72 hour monitor to see HR control and rhythm    2 HTN  BP is OK on current regimen   3  Mitral regurgitation.   Moderate on echo   Follow over time     4  HL  LDL 76  HDL 67   ContinueCrestor    5  vit D  Will get level when comes in for Zio patch   Follow up in clinic next winter     Current medicines are reviewed at length with the patient today.  The patient does not have concerns regarding medicines.  Signed, Dietrich Pates, MD  06/25/2022 2:36 PM    [facility] [address] [place], [place], [place]  [address] Phone: [phone]; Fax: [phone]

## 2022-06-25 ENCOUNTER — Ambulatory Visit: Payer: Medicare Other

## 2022-06-25 ENCOUNTER — Ambulatory Visit: Payer: Medicare Other | Attending: Internal Medicine | Admitting: Internal Medicine

## 2022-06-25 ENCOUNTER — Encounter: Payer: Self-pay | Admitting: Internal Medicine

## 2022-06-25 VITALS — BP 134/70 | HR 53 | Ht 65.0 in | Wt 148.0 lb

## 2022-06-25 DIAGNOSIS — I480 Paroxysmal atrial fibrillation: Secondary | ICD-10-CM

## 2022-06-25 NOTE — Patient Instructions (Addendum)
Medication Instructions:   *If you need a refill on your cardiac medications before your next appointment, please call your pharmacy*   Lab Work: vit d level next time she is near the office   If you have labs (blood work) drawn today and your tests are completely normal, you will receive your results only by: MyChart Message (if you have MyChart) OR A paper copy in the mail If you have any lab test that is abnormal or we need to change your treatment, we will call you to review the results.   Testing/Procedures:  Kelsey Lopez- Long Term Monitor Instructions  Your physician has requested you wear a ZIO patch monitor for 3 days.  This is a single patch monitor. [employer] supplies one patch monitor per enrollment. Additional stickers are not available. Please do not apply patch if you will be having a Nuclear Stress Test,  Echocardiogram, Cardiac CT, MRI, or Chest Xray during the period you would be wearing the  monitor. The patch cannot be worn during these tests. You cannot remove and re-apply the  ZIO XT patch monitor.  Your ZIO patch monitor will be mailed 3 day USPS to your address on file. It may take 3-5 days  to receive your monitor after you have been enrolled.  Once you have received your monitor, please review the enclosed instructions. Your monitor  has already been registered assigning a specific monitor serial # to you.  Billing and Patient Assistance Program Information  We have supplied [employer] with any of your insurance information on file for billing purposes. [employer] offers a sliding scale Patient Assistance Program for patients that do not have  insurance, or whose insurance does not completely cover the cost of the ZIO monitor.  You must apply for the Patient Assistance Program to qualify for this discounted rate.  To apply, please call [employer] at [phone], select option 4, select option 2, ask to apply for  Patient Assistance Program. Kelsey Lopez will ask your household  income, and how many people  are in your household. They will quote your out-of-pocket cost based on that information.  [employer] will also be able to set up a 32-month, interest-free payment plan if needed.  Applying the monitor   Shave hair from upper left chest.  Hold abrader disc by orange tab. Rub abrader in 40 strokes over the upper left chest as  indicated in your monitor instructions.  Clean area with 4 enclosed alcohol pads. Let dry.  Apply patch as indicated in monitor instructions. Patch will be placed under collarbone on left  side of chest with arrow pointing upward.  Rub patch adhesive wings for 2 minutes. Remove white label marked "1". Remove the white  label marked "2". Rub patch adhesive wings for 2 additional minutes.  While looking in a mirror, press and release button in center of patch. A small green light will  flash 3-4 times. This will be your only indicator that the monitor has been turned on.  Do not shower for the first 24 hours. You may shower after the first 24 hours.  Press the button if you feel a symptom. You will hear a small click. Record Date, Time and  Symptom in the Patient Logbook.  When you are ready to remove the patch, follow instructions on the last 2 pages of Patient  Logbook. Stick patch monitor onto the last page of Patient Logbook.  Place Patient Logbook in the blue and white box. Use locking tab on box and  tape box closed  securely. The blue and white box has prepaid postage on it. Please place it in the mailbox as  soon as possible. Your physician should have your test results approximately 7 days after the  monitor has been mailed back to [facility].  Call [facility] Customer Care at [phone] if you have questions regarding  your ZIO XT patch monitor. Call them immediately if you see an orange light blinking on your  monitor.  If your monitor falls off in less than 4 days, contact our Monitor department at [phone].  If your  monitor becomes loose or falls off after 4 days call [employer] at [phone] for  suggestions on securing your monitor    Follow-Up: At [facility], you and your health needs are our priority.  As part of our continuing mission to provide you with exceptional heart care, we have created designated Provider Care Teams.  These Care Teams include your primary Cardiologist (physician) and Advanced Practice Providers (APPs -  Physician Assistants and Nurse Practitioners) who all work together to provide you with the care you need, when you need it.  We recommend signing up for the patient portal called "MyChart".  Sign up information is provided on this After Visit Summary.  MyChart is used to connect with patients for Virtual Visits (Telemedicine).  Patients are able to view lab/test results, encounter notes, upcoming appointments, etc.  Non-urgent messages can be sent to your provider as well.   To learn more about what you can do with MyChart, go to [email or web address].    Your next appointment:   8 month(s)  Dr Dietrich Pates     Other Instructions

## 2022-06-25 NOTE — Progress Notes (Unsigned)
Enrolled patient for a 3 day Zio XT monitor to be mailed to patients home  

## 2022-06-25 NOTE — Addendum Note (Signed)
Addended by: Bertram Millard on: 06/25/2022 03:43 PM   Modules accepted: Orders

## 2022-08-06 ENCOUNTER — Ambulatory Visit: Payer: Medicare Other | Admitting: Internal Medicine

## 2022-08-16 ENCOUNTER — Other Ambulatory Visit: Payer: Self-pay | Admitting: Nurse Practitioner

## 2022-08-16 ENCOUNTER — Encounter: Payer: Self-pay | Admitting: Internal Medicine

## 2022-08-16 ENCOUNTER — Other Ambulatory Visit: Payer: Self-pay | Admitting: Internal Medicine

## 2022-08-16 ENCOUNTER — Ambulatory Visit (INDEPENDENT_AMBULATORY_CARE_PROVIDER_SITE_OTHER): Payer: Medicare Other | Admitting: Internal Medicine

## 2022-08-16 VITALS — BP 118/60 | HR 68 | Temp 97.6°F | Resp 16 | Ht 65.0 in | Wt 146.8 lb

## 2022-08-16 DIAGNOSIS — I10 Essential (primary) hypertension: Secondary | ICD-10-CM

## 2022-08-16 DIAGNOSIS — R7309 Other abnormal glucose: Secondary | ICD-10-CM | POA: Diagnosis not present

## 2022-08-16 DIAGNOSIS — E559 Vitamin D deficiency, unspecified: Secondary | ICD-10-CM | POA: Diagnosis not present

## 2022-08-16 DIAGNOSIS — D6859 Other primary thrombophilia: Secondary | ICD-10-CM | POA: Diagnosis not present

## 2022-08-16 DIAGNOSIS — I480 Paroxysmal atrial fibrillation: Secondary | ICD-10-CM | POA: Diagnosis not present

## 2022-08-16 DIAGNOSIS — Z79899 Other long term (current) drug therapy: Secondary | ICD-10-CM

## 2022-08-16 DIAGNOSIS — E782 Mixed hyperlipidemia: Secondary | ICD-10-CM | POA: Diagnosis not present

## 2022-08-16 DIAGNOSIS — I4819 Other persistent atrial fibrillation: Secondary | ICD-10-CM

## 2022-08-16 NOTE — Patient Instructions (Addendum)
Due to recent changes in healthcare laws, you may see the results of your imaging and laboratory studies on MyChart before your provider has had a chance to review them.  We understand that in some cases there may be results that are confusing or concerning to you. Not all laboratory results come back in the same time frame and the provider may be waiting for multiple results in order to interpret others.  Please give [place] 48 hours in order for your provider to thoroughly review all the results before contacting the office for clarification of your results.  ++++++++++++++++++++++++++  Vit D  & Vit C 1,000 mg   are recommended to help protect  against the Covid-19 and other Corona viruses.    Also it's recommended  to take  Zinc 50 mg  x 1/2 tablet = 25 mg  / day  to help  protect against the Covid-19   and best place to get  is also on [employer].[email or web address]  and don't pay more than 6-8 cents /pill !   +++++++++++++++++++++++++++++++++++++++ Recommend Adult Low Dose Aspirin or  coated  Aspirin 81 mg daily  To reduce risk of Colon Cancer 40 %,  Skin Cancer 26 % ,  Melanoma 46%  and  Pancreatic cancer 60% +++++++++++++++++++++++++++++++++++++++++ Vitamin D goal  is between 70-100.  Please make sure that you are taking your Vitamin D as directed.  It is very important as a natural anti-inflammatory  helping hair, skin, and nails, as well as reducing stroke and heart attack risk.  It helps your bones and helps with mood. It also decreases numerous cancer risks so please take it as directed.  Low Vit D is associated with a 200-300% higher risk for CANCER  and 200-300% higher risk for HEART   ATTACK  &  STROKE.   .....................................Marland Kitchen It is also associated with higher death rate at younger ages,  autoimmune diseases like Rheumatoid arthritis, Lupus, Multiple Sclerosis.    Also many other serious conditions, like depression, Alzheimer's Dementia, infertility, muscle aches,  fatigue, fibromyalgia - just to name a few. +++++++++++++++++++++++++++++++++++++++++ Recommend the book "The END of DIETING" by Dr Monico Hoar  & the book "The END of DIABETES " by Dr Monico Hoar At [email or web address] - get book & Audio CD's    Being diabetic has a  300% increased risk for heart attack, stroke, cancer, and alzheimer- type vascular dementia. It is very important that you work harder with diet by avoiding all foods that are white. Avoid white rice (brown & wild rice is OK), white potatoes (sweetpotatoes in moderation is OK), White bread or wheat bread or anything made out of white flour like bagels, donuts, rolls, buns, biscuits, cakes, pastries, cookies, pizza crust, and pasta (made from white flour & egg whites) - vegetarian pasta or spinach or wheat pasta is OK. Multigrain breads like Arnold's or [employer], or multigrain sandwich thins or flatbreads.  Diet, exercise and weight loss can reverse and cure diabetes in the early stages.  Diet, exercise and weight loss is very important in the control and prevention of complications of diabetes which affects every system in your body, ie. Brain - dementia/stroke, eyes - glaucoma/blindness, heart - heart attack/heart failure, kidneys - dialysis, stomach - gastric paralysis, intestines - malabsorption, nerves - severe painful neuritis, circulation - gangrene & loss of a leg(s), and finally cancer and Alzheimers.    I recommend avoid fried & greasy foods,  sweets/candy, white rice (brown or wild rice or Quinoa  is OK), white potatoes (sweet potatoes are OK) - anything made from white flour - bagels, doughnuts, rolls, buns, biscuits,white and wheat breads, pizza crust and traditional pasta made of white flour & egg white(vegetarian pasta or spinach or wheat pasta is OK).  Multi-grain bread is OK - like multi-grain flat bread or sandwich thins. Avoid alcohol in excess. Exercise is also important.    Eat all the vegetables you want - avoid meat,  especially red meat and dairy - especially cheese.  Cheese is the most concentrated form of trans-fats which is the worst thing to clog up our arteries. Veggie cheese is OK which can be found in the fresh produce section at [employer] or [employer] or [employer]  +++++++++++++++++++++++++++++++++++++++ DASH Eating Plan  DASH stands for "Dietary Approaches to Stop Hypertension."   The DASH eating plan is a healthy eating plan that has been shown to reduce high blood pressure (hypertension). Additional health benefits may include reducing the risk of type 2 diabetes mellitus, heart disease, and stroke. The DASH eating plan may also help with weight loss. WHAT DO I NEED TO KNOW ABOUT THE DASH EATING PLAN? For the DASH eating plan, you will follow these general guidelines: Choose foods with a percent daily value for sodium of less than 5% (as listed on the food label). Use salt-free seasonings or herbs instead of table salt or sea salt. Check with your health care provider or pharmacist before using salt substitutes. Eat lower-sodium products, often labeled as "lower sodium" or "no salt added." Eat fresh foods. Eat more vegetables, fruits, and low-fat dairy products. Choose whole grains. Look for the word "whole" as the first word in the ingredient list. Choose fish  Limit sweets, desserts, sugars, and sugary drinks. Choose heart-healthy fats. Eat veggie cheese  Eat more home-cooked food and less restaurant, buffet, and fast food. Limit fried foods. Cook foods using methods other than frying. Limit canned vegetables. If you do use them, rinse them well to decrease the sodium. When eating at a restaurant, ask that your food be prepared with less salt, or no salt if possible.                      WHAT FOODS CAN I EAT? Read Dr Francis Dowse Fuhrman's books on The End of Dieting & The End of Diabetes  Grains Whole grain or whole wheat bread. Brown rice. Whole grain or whole wheat pasta. Quinoa,  bulgur, and whole grain cereals. Low-sodium cereals. Corn or whole wheat flour tortillas. Whole grain cornbread. Whole grain crackers. Low-sodium crackers.  Vegetables Fresh or frozen vegetables (raw, steamed, roasted, or grilled). Low-sodium or reduced-sodium tomato and vegetable juices. Low-sodium or reduced-sodium tomato sauce and paste. Low-sodium or reduced-sodium canned vegetables.   Fruits All fresh, canned (in natural juice), or frozen fruits.  Protein Products  All fish and seafood.  Dried beans, peas, or lentils. Unsalted nuts and seeds. Unsalted canned beans.  Dairy Low-fat dairy products, such as skim or 1% milk, 2% or reduced-fat cheeses, low-fat ricotta or cottage cheese, or plain low-fat yogurt. Low-sodium or reduced-sodium cheeses.  Fats and Oils Tub margarines without trans fats. Light or reduced-fat mayonnaise and salad dressings (reduced sodium). Avocado. Safflower, olive, or canola oils. Natural peanut or almond butter.  Other Unsalted popcorn and pretzels. The items listed above may not be a complete list of recommended foods or beverages. Contact your dietitian for more options.  +++++++++++++++  WHAT FOODS ARE NOT RECOMMENDED? Grains/ White flour  or wheat flour White bread. White pasta. White rice. Refined cornbread. Bagels and croissants. Crackers that contain trans fat.  Vegetables  Creamed or fried vegetables. Vegetables in a . Regular canned vegetables. Regular canned tomato sauce and paste. Regular tomato and vegetable juices.  Fruits Dried fruits. Canned fruit in light or heavy syrup. Fruit juice.  Meat and Other Protein Products Meat in general - RED meat & White meat.  Fatty cuts of meat. Ribs, chicken wings, all processed meats as bacon, sausage, bologna, salami, fatback, hot dogs, bratwurst and packaged luncheon meats.  Dairy Whole or 2% milk, cream, half-and-half, and cream cheese. Whole-fat or sweetened yogurt. Full-fat cheeses or blue cheese.  Non-dairy creamers and whipped toppings. Processed cheese, cheese spreads, or cheese curds.  Condiments Onion and garlic salt, seasoned salt, table salt, and sea salt. Canned and packaged gravies. Worcestershire sauce. Tartar sauce. Barbecue sauce. Teriyaki sauce. Soy sauce, including reduced sodium. Steak sauce. Fish sauce. Oyster sauce. Cocktail sauce. Horseradish. Ketchup and mustard. Meat flavorings and tenderizers. Bouillon cubes. Hot sauce. Tabasco sauce. Marinades. Taco seasonings. Relishes.  Fats and Oils Butter, stick margarine, lard, shortening and bacon fat. Coconut, palm kernel, or palm oils. Regular salad dressings.  Pickles and olives. Salted popcorn and pretzels.  The items listed above may not be a complete list of foods and beverages to avoid.

## 2022-08-16 NOTE — Progress Notes (Signed)
\ Future Appointments  Date Time Provider Department  08/16/2022                                 6 mo ov    2:30 PM Lucky Cowboy, MD [facility]  09/28/2022  3:20 PM Pricilla Riffle, MD [facility]  11/17/2022                               9 mo ov 10:30 AM Adela Glimpse, NP [facility]  02/21/2023                               cpe 10:00 AM Lucky Cowboy, MD [facility]  05/23/2023                               wellness  4:00 PM Adela Glimpse, NP [facility]    History of Present Illness:       This very nice 82 y.o.  WWF  presents for 3 month follow up with HTN, HLD, Pre-Diabetes and Vitamin D Deficiency.         Patient is treated for HTN (1996)  & BP has been controlled at home. Today's BP is at goal -  118/60.  In Jan 2024, Patient was dx'd with Afib & initiated on Eliquis . Patient is followed by Dr Dietrich Pates.  In Feb 2024 , patient had CV to NSR.  2-D Cardiac echo showed diastolic Dysfunction &  No CHF.  Patient has had no complaints of any cardiac type chest pain, palpitations, dyspnea Pollyann Kennedy /PND, dizziness, claudication, or dependent edema.        Hyperlipidemia is controlled with diet & Rosuvastatin. Patient denies myalgias or other med SE's. Last Lipids were at goal :  Lab Results  Component Value Date   CHOL 186 02/02/2022   HDL 75 02/02/2022   LDLCALC 84 02/02/2022   TRIG 167 (H) 02/02/2022   CHOLHDL 2.5 02/02/2022     Also, the patient has history of  PreDiabetes (A1c 5.8% /2015) and has had no symptoms of reactive hypoglycemia, diabetic polys, paresthesias or visual blurring.  Last A1c was normal & at goal :  Lab Results  Component Value Date   HGBA1C 5.5 02/02/2022                                                          Further, the patient also has history of Vitamin D Deficiency and Level in Feb was elevated at 145 & Vit D was d/c'd. Repeat Vit D on Apr 8 was 35 and  Vit D was resumed.  Lab Results  Component Value Date   VD25OH 98 05/17/2022      Current Outpatient Medications on File Prior to Visit  Medication Sig   ALPRAZolam 0.5 MG tablet 1/2-1 TABLET AT BEDTIME ONLY IF NEEDED    apixaban (ELIQUIS) 5 MG TABS  Take  1 tablet  2 x /day   cetirizine 10 MG tablet Take  daily.   citalopram 20 MG tablet TAKE 1 TABLET  EVERY DAY  diltiazem XT 240 MG 24 hr capsule Take 1 tablet Daily    FLAX SEED OIL 1000 MG CAPS Take  daily.   GUAIFENESIN ER  Mucinex 400 mg  Take every morning.   Magnesium 400 MG CAPS Take  daily.   meloxicam 15 MG tablet TAKE 1/2 TO 1 TABLET DAILY    metoprolol succinate  XL 25 MG  Take 1 tablet daily.   montelukast 10 MG tablet TAKE 1 TABLET  DAILY   Multiple Vitamin Take 1 tablet daily.   Calcium 1800 mg Take daily.    rosuvastatin 20 MG tablet Take 1 tablet  daily.   tiZANidine 4 MG tablet TAKE 1 TABLET EVERY 8  HRS AS NEEDED    traMADol 50 MG tablet Take 1-2 tablets  daily as needed.   traZODone 150 MG tablet TAKE 1/2 TO 1 TABLET 1 HOUR BEFORE BEDTIME    vitamin C 500 MG tablet Take 500 mg by mouth daily.     Allergies  Allergen Reactions   Ace Inhibitors     Cough   Ampicillin     rash   Mevacor [Lovastatin]     Elevated LFT's   Penicillins     REACTION: hives and  itching   Sulfa Antibiotics     rash     PMHx:   Past Medical History:  Diagnosis Date   Anxiety    Dyslipidemia    on Rx x20 years   HTN (hypertension)    Hyperlipidemia    Insomnia    Morton's neuroma of left foot    Multiple allergies    Osteoarthritis    Polycythemia    PVC (premature ventricular contraction)      Immunization History  Administered Date(s) Administered   Influenza Split 11/15/2011, 11/28/2012, 10/31/2013, 01/07/2014   Influenza, High Dose  10/01/2018, 10/23/2019, 10/14/2020, 11/25/2021   Influenza 09/29/2016, 10/01/2018   PFIZER Comirnaty Covid-19 Vacc 05/12/2020   PFIZER-SARS-COV-2 Vacc 03/02/2019, 03/23/2019, 11/20/2019   Pfizer Covid-19 Vaccine Bivalent  12/01/2020, 06/11/2021,  12/01/2021   Pneumococcal -13 10/02/2013   Pneumococcal -23 08/03/2017   RSV Vaccine (Arexvy) 12/01/2021   Tdap 08/01/2013   Zoster Recombinant(Shingrix) 11/10/2016, 01/10/2017   Zoster, Live 05/25/2011     Past Surgical History:  Procedure Laterality Date   ABDOMINAL HYSTERECTOMY     CARDIOVERSION N/A 03/25/2022   Procedure: CARDIOVERSION;  Surgeon: Quintella Reichert, MD;  Location: [facility];  Service: Cardiovascular;  Laterality: N/A;   CATARACT EXTRACTION Right 2019   Dr. Charlotte Sanes    KNEE ARTHROSCOPY     R   TONSILLECTOMY     VESICOVAGINAL FISTULA CLOSURE W/ TAH       FHx:    Reviewed / unchanged   SHx:    Reviewed / unchanged    Systems Review:  Constitutional: Denies fever, chills, wt changes, headaches, insomnia, fatigue, night sweats, change in appetite. Eyes: Denies redness, blurred vision, diplopia, discharge, itchy, watery eyes.  ENT: Denies discharge, congestion, post nasal drip, epistaxis, sore throat, earache, hearing loss, dental pain, tinnitus, vertigo, sinus pain, snoring.  CV: Denies chest pain, palpitations, irregular heartbeat, syncope, dyspnea, diaphoresis, orthopnea, PND, claudication or edema. Respiratory: denies cough, dyspnea, DOE, pleurisy, hoarseness, laryngitis, wheezing.  Gastrointestinal: Denies dysphagia, odynophagia, heartburn, reflux, water brash, abdominal pain or cramps, nausea, vomiting, bloating, diarrhea, constipation, hematemesis, melena, hematochezia  or hemorrhoids. Genitourinary: Denies dysuria, frequency, urgency, nocturia, hesitancy, discharge, hematuria or flank pain. Musculoskeletal: Denies arthralgias, myalgias, stiffness, jt. swelling, pain, limping or strain/sprain.  Skin:  Denies pruritus, rash, hives, warts, acne, eczema or change in skin lesion(s). Neuro: No weakness, tremor, incoordination, spasms, paresthesia or pain. Psychiatric: Denies confusion, memory loss or sensory loss. Endo: Denies change in weight, skin or hair  change.  Heme/Lymph: No excessive bleeding, bruising or enlarged lymph nodes.   Physical Exam  BP 118/60   Pulse 68   Temp 97.6 F (36.4 C)   Resp 16   Ht 5\' 5"  (1.651 m)   Wt 146 lb 12.8 oz (66.6 kg)   SpO2 97%   BMI 24.43 kg/m   Appears  well nourished, well groomed  and in no distress.  Eyes: PERRLA, EOMs, conjunctiva no swelling or erythema. Sinuses: No frontal/maxillary tenderness ENT/Mouth: EAC's clear, TM's nl w/o erythema, bulging. Nares clear w/o erythema, swelling, exudates. Oropharynx clear without erythema or exudates. Oral hygiene is good. Tongue normal, non obstructing. Hearing intact.  Neck: Supple. Thyroid not palpable. Car 2+/2+ without bruits, nodes or JVD. Chest: Respirations nl with BS clear & equal w/o rales, rhonchi, wheezing or stridor.  Cor: Heart sounds normal w/ regular rate and rhythm without sig. murmurs, gallops, clicks or rubs. Peripheral pulses normal and equal  without edema.  Abdomen: Soft & bowel sounds normal. Non-tender w/o guarding, rebound, hernias, masses or organomegaly.  Lymphatics: Unremarkable.  Musculoskeletal: Full ROM all peripheral extremities, joint stability, 5/5 strength and normal gait.  Skin: Warm, dry without exposed rashes, lesions or ecchymosis apparent.  Neuro: Cranial nerves intact, reflexes equal bilaterally. Sensory-motor testing grossly intact. Tendon reflexes grossly intact.  Pysch: Alert & oriented x 3.  Insight and judgement nl & appropriate. No ideations.   Assessment and Plan:   1. Essential hypertension  - Continue medication, monitor blood pressure at home.  - Continue DASH diet.  Reminder to go to the ER if any CP,  SOB, nausea, dizziness, severe HA, changes vision/speech.      - CBC with Differential/Platelet - COMPLETE METABOLIC PANEL WITH GFR - Magnesium - TSH   2. Hyperlipidemia, mixed  - Continue diet/meds, exercise,& lifestyle modifications.  - Continue monitor periodic cholesterol/liver &  renal functions     - Lipid panel - TSH   3. Abnormal glucose  - Continue diet, exercise  - Lifestyle modifications.  - Monitor appropriate labs   - Hemoglobin A1c - Insulin, random   4. Vitamin D deficiency  - Continue supplementation     - VITAMIN D 25 Hydroxy  5. Paroxysmal A-fib (HCC)  - TSH   6. Thrombophilia (HCC)  - CBC with Differential/Platelet   7. Medication management  - CBC with Differential/Platelet - COMPLETE METABOLIC PANEL WITH GFR - Magnesium - Lipid panel - TSH - Hemoglobin A1c - Insulin, random - VITAMIN D 25 Hydroxy         Discussed  regular exercise, BP monitoring, weight control to achieve/maintain BMI less than 25 and discussed med and SE's. Recommended labs to assess /monitor clinical status .  I discussed the assessment and treatment plan with the patient. The patient was provided an opportunity to ask questions and all were answered. The patient agreed with the plan and demonstrated an understanding of the instructions.  I provided over 30 minutes of exam, counseling, chart review and  complex critical decision making.        The patient was advised to call back or seek an in-person evaluation if the symptoms worsen or if the condition fails to improve as anticipated.   Marinus Maw, MD

## 2022-08-17 LAB — CBC WITH DIFFERENTIAL/PLATELET
Absolute Monocytes: 460 cells/uL (ref 200–950)
Basophils Absolute: 50 cells/uL (ref 0–200)
Basophils Relative: 0.8 %
Eosinophils Absolute: 139 cells/uL (ref 15–500)
Eosinophils Relative: 2.2 %
HCT: 40.8 % (ref 35.0–45.0)
Hemoglobin: 13.6 g/dL (ref 11.7–15.5)
Lymphs Abs: 1430 cells/uL (ref 850–3900)
MCH: 30.8 pg (ref 27.0–33.0)
MCHC: 33.3 g/dL (ref 32.0–36.0)
MCV: 92.3 fL (ref 80.0–100.0)
MPV: 10.3 fL (ref 7.5–12.5)
Monocytes Relative: 7.3 %
Neutro Abs: 4221 cells/uL (ref 1500–7800)
Neutrophils Relative %: 67 %
Platelets: 219 10*3/uL (ref 140–400)
RBC: 4.42 10*6/uL (ref 3.80–5.10)
RDW: 15.5 % — ABNORMAL HIGH (ref 11.0–15.0)
Total Lymphocyte: 22.7 %
WBC: 6.3 10*3/uL (ref 3.8–10.8)

## 2022-08-17 LAB — COMPLETE METABOLIC PANEL WITH GFR
AG Ratio: 2.2 (calc) (ref 1.0–2.5)
ALT: 17 U/L (ref 6–29)
AST: 20 U/L (ref 10–35)
Albumin: 4.4 g/dL (ref 3.6–5.1)
Alkaline phosphatase (APISO): 75 U/L (ref 37–153)
BUN: 10 mg/dL (ref 7–25)
CO2: 30 mmol/L (ref 20–32)
Calcium: 10 mg/dL (ref 8.6–10.4)
Chloride: 102 mmol/L (ref 98–110)
Creat: 0.73 mg/dL (ref 0.60–0.95)
Globulin: 2 g/dL (calc) (ref 1.9–3.7)
Glucose, Bld: 152 mg/dL — ABNORMAL HIGH (ref 65–99)
Potassium: 3.8 mmol/L (ref 3.5–5.3)
Sodium: 140 mmol/L (ref 135–146)
Total Bilirubin: 0.6 mg/dL (ref 0.2–1.2)
Total Protein: 6.4 g/dL (ref 6.1–8.1)
eGFR: 83 mL/min/{1.73_m2} (ref >=60)

## 2022-08-17 LAB — INSULIN, RANDOM: Insulin: 76.3 u[IU]/mL — ABNORMAL HIGH

## 2022-08-17 LAB — MAGNESIUM: Magnesium: 1.9 mg/dL (ref 1.5–2.5)

## 2022-08-17 LAB — VITAMIN D 25 HYDROXY (VIT D DEFICIENCY, FRACTURES): Vit D, 25-Hydroxy: 70 ng/mL (ref 30–100)

## 2022-08-17 LAB — LIPID PANEL
Cholesterol: 176 mg/dL (ref <200)
HDL: 74 mg/dL (ref >=50)
LDL Cholesterol (Calc): 83 mg/dL (calc)
Non-HDL Cholesterol (Calc): 102 mg/dL (calc) (ref <130)
Total CHOL/HDL Ratio: 2.4 (calc) (ref <5.0)
Triglycerides: 98 mg/dL (ref <150)

## 2022-08-17 LAB — TSH: TSH: 2.8 mIU/L (ref 0.40–4.50)

## 2022-08-17 LAB — HEMOGLOBIN A1C W/OUT EAG: Hgb A1c MFr Bld: 5.6 % of total Hgb (ref <5.7)

## 2022-08-17 NOTE — Progress Notes (Signed)
^<^<^<^<^<^<^<^<^<^<^<^<^<^<^<^<^<^<^<^<^<^<^<^<^<^<^<^<^<^<^<^<^<^<^<^<^ ^>^>^>^>^>^>^>^>^>^>^>>^>^>^>^>^>^>^>^>^>^>^>^>^>^>^>^>^>^>^>^>^>^>^>^>^>  -  Test results slightly outside the reference range are not unusual. If there is anything important, I will review this with you,  otherwise it is considered normal test values.  If you have further questions,  please do not hesitate to contact me at the office or via My Chart.   ^<^<^<^<^<^<^<^<^<^<^<^<^<^<^<^<^<^<^<^<^<^<^<^<^<^<^<^<^<^<^<^<^<^<^<^<^ ^>^>^>^>^>^>^>^>^>^>^>^>^>^>^>^>^>^>^>^>^>^>^>^>^>^>^>^>^>^>^>^>^>^>^>^>^  -  A1c - Normal - No Diabetes - Great !  ^>^>^>^>^>^>^>^>^>^>^>^>^>^>^>^>^>^>^>^>^>^>^>^>^>^>^>^>^>^>^>^>^>^>^>^>^  -  Vitamin D = 70 - Also Excellent   -   please keep dose same   ^>^>^>^>^>^>^>^>^>^>^>^>^>^>^>^>^>^>^>^>^>^>^>^>^>^>^>^>^>^>^>^>^>^>^>^>^  - Chol = 176     -   Excellent   - Very low risk for Heart Attack  / Stroke  ^>^>^>^>^>^>^>^>^>^>^>^>^>^>^>^>^>^>^>^>^>^>^>^>^>^>^>^>^>^>^>^>^>^>^>^>^ ^>^>^>^>^>^>^>^>^>^>^>^>^>^>^>^>^>^>^>^>^>^>^>^>^>^>^>^>^>^>^>^>^>^>^>^>^  - All Else - CBC - Kidneys - Electrolytes - Liver - Magnesium & Thyroid    - all  Normal / OK  ^>^>^>^>^>^>^>^>^>^>^>^>^>^>^>^>^>^>^>^>^>^>^>^>^>^>^>^>^>^>^>^>^>^>^>^>^ ^>^>^>^>^>^>^>^>^>^>^>^>^>^>^>^>^>^>^>^>^>^>^>^>^>^>^>^>^>^>^>^>^>^>^>^>^

## 2022-09-16 ENCOUNTER — Other Ambulatory Visit: Payer: Self-pay | Admitting: Internal Medicine

## 2022-09-16 DIAGNOSIS — I4719 Other supraventricular tachycardia: Secondary | ICD-10-CM

## 2022-09-27 NOTE — Progress Notes (Unsigned)
Cardiology Office Note   Date:  09/28/2022   ID:  Kelsey Lopez November 09, 1940, MRN [id number]  PCP:  Kelsey Cowboy, MD  Cardiologist:   Dietrich Pates, MD   Patient presents for follow up of atrial fibrillation     History of Present Illness: Kelsey Lopez is a 82 y.o. female with a history of HTN, HL, prediabetes, followed by Dr Oneta Rack   In Jan 2024 she was found to be in afib   Placed on Eliquis      Feb 2024 underwent cardioversion     I saw the pt in May 2024  She was set up for a monitor   Mailed in in but not received  She says she is feeling great  No Cp  No SOB   No palptations   No dizziness   Walking some    Current Meds  Medication Sig   ALPRAZolam (XANAX) 0.5 MG tablet 1/2-1 TABLET AT BEDTIME ONLY IF NEEDED FOR SLEEP. LIMIT 5 DAYS/WEEK TO AVOID ADDICTION & DEMENTIA   cetirizine (ZYRTEC) 10 MG tablet Take 10 mg by mouth daily.   citalopram (CELEXA) 20 MG tablet TAKE 1 TABLET  EVERY DAY FOR MOOD   diltiazem (CARDIZEM CD) 240 MG 24 hr capsule Take  1 tablet  Daily for Heart Rhythm                                                                     /                                                                   TAKE                                         BY                                                 MOUTH   ELIQUIS 5 MG TABS tablet TAKE 1 TABLET 2 TIMES A DAY (EVERY 12 HR) TO PREVENT BLOOD CLOTS   Flaxseed, Linseed, (FLAX SEED OIL) 1000 MG CAPS Take 1,000 mg by mouth daily.   GUAIFENESIN ER PO Take 400 mg by mouth every morning. Mucinex   Magnesium 400 MG CAPS Take 400 mg by mouth daily.   meloxicam (MOBIC) 15 MG tablet TAKE 1/2 TO 1 TABLET DAILY WITH FOOD FOR PAIN & INFLAMMATION   metoprolol succinate (TOPROL XL) 25 MG 24 hr tablet Take 1 tablet (25 mg total) by mouth daily.   montelukast (SINGULAIR) 10 MG tablet TAKE 1 TABLET BY MOUTH DAILY FOR ALLERGIES   Multiple Vitamin (MULTIVITAMIN) tablet Take 1 tablet by mouth daily.   NON FORMULARY Take 1,800 mg  by mouth daily. Calcium 1800 mg   rosuvastatin (CRESTOR) 20  MG tablet Take 1 tablet (20 mg total) by mouth daily.   tiZANidine (ZANAFLEX) 4 MG tablet TAKE 1 TABLET (4 MG TOTAL) BY MOUTH EVERY 8 (EIGHT) HOURS AS NEEDED FOR MUSCLE SPASMS   traMADol (ULTRAM) 50 MG tablet Take 1-2 tablets (50-100 mg total) by mouth daily as needed.   traZODone (DESYREL) 150 MG tablet TAKE 1/2 TO 1 TABLET 1 HOUR BEFORE BEDTIME IF NEEDED FOR SLEEP (Patient taking differently: Take 150 mg by mouth at bedtime.)   vitamin C (ASCORBIC ACID) 500 MG tablet Take 500 mg by mouth daily.     Allergies:   Ace inhibitors, Ampicillin, Mevacor [lovastatin], Penicillins, and Sulfa antibiotics   Past Medical History:  Diagnosis Date   Anxiety    Dyslipidemia    on Rx x20 years   HTN (hypertension)    Hyperlipidemia    Insomnia    Morton's neuroma of left foot    Multiple allergies    Osteoarthritis    Polycythemia    PVC (premature ventricular contraction)     Past Surgical History:  Procedure Laterality Date   ABDOMINAL HYSTERECTOMY     CARDIOVERSION N/A 03/25/2022   Procedure: CARDIOVERSION;  Surgeon: Quintella Reichert, MD;  Location: [facility];  Service: Cardiovascular;  Laterality: N/A;   CATARACT EXTRACTION Right 2019   Dr. Charlotte Sanes    KNEE ARTHROSCOPY     R   TONSILLECTOMY     VESICOVAGINAL FISTULA CLOSURE W/ TAH       Social History:  The patient  reports that she has never smoked. She has never used smokeless tobacco. She reports current alcohol use.   Family History:  The patient's family history includes Cancer in her father; Diabetes in her brother; Heart attack in her mother; Heart disease in her mother; Hyperlipidemia in her brother; Osteoarthritis in her father and mother.    ROS:  Please see the history of present illness. All other systems are reviewed and  Negative to the above problem except as noted.    PHYSICAL EXAM: VS:  BP 132/70   Pulse (!) 58   Ht 5\' 5"  (1.651 m)   Wt 147 lb (66.7  kg)   SpO2 96%   BMI 24.46 kg/m   GEN: Well nourished, well developed,in NAD  HEENT: normal  Neck: no JVD, no  bruit Cardiac: RRR  No S3  No murmurs   Mild lipedema   GI: soft, nontender, No hepatomegaly   EKG:  EKG is not ordered today   Echo June 02, 2022    1. Left ventricular ejection fraction by 3D volume is 62 %. The left  ventricle has no regional wall motion abnormalities. Left ventricular  diastolic parameters were normal. The average left ventricular global  longitudinal strain is -17.9 %. The global  longitudinal strain is at the lower limit of normal.   2. Right ventricular systolic function is normal. The right ventricular  size is normal. There is normal pulmonary artery systolic pressure.   3. Left atrial size was severely dilated.   4. The mitral valve is myxomatous. Moderate mitral valve regurgitation.  There is mild late systolic prolapse of both leaflets of the mitral valve.   5. The aortic valve is tricuspid. Aortic valve regurgitation is trivial.  No aortic stenosis is present.   6. The inferior vena cava is normal in size with greater than 50%  respiratory variability, suggesting right atrial pressure of 3 mmHg.   7. The markedly increased E/A ratio is likely  related to atrial  mechanical failure or stunning after a recent episode of atrial  fibrillation. the diastolic mitral annulus velocities suggest normal LV  diastolic function.   Feb 2024   1. Left ventricular ejection fraction, by estimation, is 60 to 65%. The  left ventricle has normal function. The left ventricle has no regional  wall motion abnormalities. There is mild concentric left ventricular  hypertrophy. Left ventricular diastolic  parameters are consistent with Grade III diastolic dysfunction  (restrictive).   2. Right ventricular systolic function is normal. The right ventricular  size is normal. There is moderately elevated pulmonary artery systolic  pressure. The estimated right  ventricular systolic pressure is 54.0 mmHg.   3. Left atrial size was severely dilated.   4. Right atrial size was mildly dilated.   5. The mitral valve is normal in structure. Moderate mitral valve  regurgitation. No evidence of mitral stenosis.   6. Tricuspid valve regurgitation is mild to moderate.   7. The aortic valve is tricuspid. There is mild calcification of the  aortic valve. Aortic valve regurgitation is not visualized. No aortic  stenosis is present.   8. The inferior vena cava is normal in size with <50% respiratory  variability, suggesting right atrial pressure of 8 mmHg.    Lipid Panel    Component Value Date/Time   CHOL 176 08/16/2022 1421   TRIG 98 08/16/2022 1421   HDL 74 08/16/2022 1421   CHOLHDL 2.4 08/16/2022 1421   VLDL 18 06/28/2016 1149   LDLCALC 83 08/16/2022 1421      Wt Readings from Last 3 Encounters:  09/28/22 147 lb (66.7 kg)  08/16/22 146 lb 12.8 oz (66.6 kg)  06/25/22 148 lb (67.1 kg)      ASSESSMENT AND PLAN:  1  Atrial fib  Pt found to be in afib in Jan 2024  Underwent DCCV   CLinically in SR P 53    Follow   Keep on Eliquis She feels great   No dizziness    She mailed in monitor  ? If got lost in mail   Will try to track down  Keep on same meds for now     2 HTN  BP is Fair on current regimen Continued    3  Mitral regurgitation.   Moderate on echo   Follow over time   No significant murmur on exam       4  HL  LDL 83  HDL 74  in July 2024   ContinueCrestor    F/U next spring    Current medicines are reviewed at length with the patient today.  The patient does not have concerns regarding medicines.  Signed, Dietrich Pates, MD  09/28/2022 3:49 PM    [facility] [address] [place], [place], [place]  [address] Phone: [phone]; Fax: [phone]

## 2022-09-28 ENCOUNTER — Encounter: Payer: Self-pay | Admitting: Internal Medicine

## 2022-09-28 ENCOUNTER — Ambulatory Visit: Payer: Medicare Other | Attending: Internal Medicine | Admitting: Internal Medicine

## 2022-09-28 VITALS — BP 132/70 | HR 58 | Ht 65.0 in | Wt 147.0 lb

## 2022-09-28 DIAGNOSIS — I480 Paroxysmal atrial fibrillation: Secondary | ICD-10-CM

## 2022-09-28 NOTE — Patient Instructions (Signed)
Medication Instructions:   *If you need a refill on your cardiac medications before your next appointment, please call your pharmacy*   Lab Work:  If you have labs (blood work) drawn today and your tests are completely normal, you will receive your results only by: MyChart Message (if you have MyChart) OR A paper copy in the mail If you have any lab test that is abnormal or we need to change your treatment, we will call you to review the results.   Testing/Procedures:    Follow-Up: At [facility], you and your health needs are our priority.  As part of our continuing mission to provide you with exceptional heart care, we have created designated Provider Care Teams.  These Care Teams include your primary Cardiologist (physician) and Advanced Practice Providers (APPs -  Physician Assistants and Nurse Practitioners) who all work together to provide you with the care you need, when you need it.  We recommend signing up for the patient portal called "MyChart".  Sign up information is provided on this After Visit Summary.  MyChart is used to connect with patients for Virtual Visits (Telemedicine).  Patients are able to view lab/test results, encounter notes, upcoming appointments, etc.  Non-urgent messages can be sent to your provider as well.   To learn more about what you can do with MyChart, go to [email or web address].    Your next appointment:   8 month(s) END OF MAY 2025 DR PAULA ROSS     Other Instructions

## 2022-09-30 DIAGNOSIS — M419 Scoliosis, unspecified: Secondary | ICD-10-CM | POA: Diagnosis not present

## 2022-09-30 DIAGNOSIS — M41124 Adolescent idiopathic scoliosis, thoracic region: Secondary | ICD-10-CM | POA: Diagnosis not present

## 2022-09-30 DIAGNOSIS — M4316 Spondylolisthesis, lumbar region: Secondary | ICD-10-CM | POA: Diagnosis not present

## 2022-09-30 DIAGNOSIS — M41126 Adolescent idiopathic scoliosis, lumbar region: Secondary | ICD-10-CM | POA: Diagnosis not present

## 2022-10-19 ENCOUNTER — Other Ambulatory Visit: Payer: Self-pay | Admitting: Nurse Practitioner

## 2022-10-19 DIAGNOSIS — F419 Anxiety disorder, unspecified: Secondary | ICD-10-CM

## 2022-11-16 DIAGNOSIS — M41124 Adolescent idiopathic scoliosis, thoracic region: Secondary | ICD-10-CM | POA: Diagnosis not present

## 2022-11-16 DIAGNOSIS — M4316 Spondylolisthesis, lumbar region: Secondary | ICD-10-CM | POA: Diagnosis not present

## 2022-11-16 DIAGNOSIS — M41126 Adolescent idiopathic scoliosis, lumbar region: Secondary | ICD-10-CM | POA: Diagnosis not present

## 2022-11-17 ENCOUNTER — Encounter: Payer: Self-pay | Admitting: Nurse Practitioner

## 2022-11-17 ENCOUNTER — Ambulatory Visit (INDEPENDENT_AMBULATORY_CARE_PROVIDER_SITE_OTHER): Payer: Medicare Other | Admitting: Nurse Practitioner

## 2022-11-17 VITALS — BP 136/70 | HR 62 | Temp 97.8°F | Ht 65.0 in | Wt 149.8 lb

## 2022-11-17 DIAGNOSIS — F325 Major depressive disorder, single episode, in full remission: Secondary | ICD-10-CM

## 2022-11-17 DIAGNOSIS — N182 Chronic kidney disease, stage 2 (mild): Secondary | ICD-10-CM | POA: Diagnosis not present

## 2022-11-17 DIAGNOSIS — Z23 Encounter for immunization: Secondary | ICD-10-CM | POA: Diagnosis not present

## 2022-11-17 DIAGNOSIS — M8589 Other specified disorders of bone density and structure, multiple sites: Secondary | ICD-10-CM | POA: Diagnosis not present

## 2022-11-17 DIAGNOSIS — R7309 Other abnormal glucose: Secondary | ICD-10-CM

## 2022-11-17 DIAGNOSIS — I10 Essential (primary) hypertension: Secondary | ICD-10-CM | POA: Diagnosis not present

## 2022-11-17 DIAGNOSIS — M1990 Unspecified osteoarthritis, unspecified site: Secondary | ICD-10-CM | POA: Diagnosis not present

## 2022-11-17 DIAGNOSIS — G4700 Insomnia, unspecified: Secondary | ICD-10-CM | POA: Diagnosis not present

## 2022-11-17 DIAGNOSIS — Z79899 Other long term (current) drug therapy: Secondary | ICD-10-CM

## 2022-11-17 DIAGNOSIS — E782 Mixed hyperlipidemia: Secondary | ICD-10-CM

## 2022-11-17 DIAGNOSIS — E559 Vitamin D deficiency, unspecified: Secondary | ICD-10-CM

## 2022-11-17 DIAGNOSIS — I471 Supraventricular tachycardia, unspecified: Secondary | ICD-10-CM

## 2022-11-17 DIAGNOSIS — I4819 Other persistent atrial fibrillation: Secondary | ICD-10-CM

## 2022-11-17 DIAGNOSIS — F419 Anxiety disorder, unspecified: Secondary | ICD-10-CM

## 2022-11-17 DIAGNOSIS — E663 Overweight: Secondary | ICD-10-CM

## 2022-11-17 NOTE — Patient Instructions (Signed)

## 2022-11-17 NOTE — Progress Notes (Signed)
FOLLOW UP  Assessment:    Essential hypertension Discussed DASH (Dietary Approaches to Stop Hypertension) DASH diet is lower in sodium than a typical American diet. Cut back on foods that are high in saturated fat, cholesterol, and trans fats. Eat more whole-grain foods, fish, poultry, and nuts Remain active and exercise as tolerated daily.  Monitor BP at home-Call if greater than 130/80.  Check CMP/CBC  Other abnormal glucose Education: Reviewed 'ABCs' of diabetes management  Discussed goals to be met and/or maintained include A1C (<7) Blood pressure (<130/80) Cholesterol (LDL <70) Continue Eye Exam yearly  Continue Dental Exam Q6 mo Discussed dietary recommendations Discussed Physical Activity recommendations Foot exam UTD Check A1C  Mixed hyperlipidemia Discussed lifestyle modifications. Recommended diet heavy in fruits and veggies, omega 3's. Decrease consumption of animal meats, cheeses, and dairy products. Remain active and exercise as tolerated. Continue to monitor. Check lipids/TSH  Depression/ Anxiety Continue medications.   Reviewed relaxation techniques.  Sleep hygiene. Recommended Cognitive Behavioral Therapy (CBT). Recommended mindfulness meditation and exercise.   Insight-oriented psychotherapy given for 16 minutes exclusively. Psychoeducation:  encouraged personality growth wand development through coping techniques and problem-solving skills. Limit/Decrease/Monitor drug/alcohol intake.    Medication management All medications discussed and reviewed in full. All questions and concerns regarding medications addressed.    Vitamin D deficiency Above goal Adjust supplement based on pending reading. Monitor Vitamin D levels  Osteoarthritis, unspecified osteoarthritis type, unspecified site RICE when flared Orthopedics following  Overweight Discussed appropriate BMI Diet modification. Physical activity. Encouraged/praised to build  confidence.  Insomnia Discussed good sleep hygiene. Establish bed and wake times. Sleep restriction-only sleep estimated hrs sleep. Bed only for sleep, only sleep when sleepy, out of bed if anxious (stimulus control). Reviewed relaxation techniques, mindful meditations. Expected sleep duration. Addressed worries about not sleeping.   Stage 2 CKD Discussed how what you eat and drink can aide in kidney protection. Stay well hydrated. Avoid high salt foods. Avoid NSAIDS. Keep BP and BG well controlled.   Take medications as prescribed. Remain active and exercise as tolerated daily. Maintain weight.  Continue to monitor. Check CMP/GFR/Microablumin  Osteopenia  Pursue a combination of weight-bearing exercises and strength training. Advised on fall prevention measures including proper lighting in all rooms, removal of area rugs and floor clutter, use of walking devices as deemed appropriate, avoidance of uneven walking surfaces. Smoking cessation and moderate alcohol consumption if applicable Consume 800 to 1000 IU of vitamin D daily with a goal vitamin D serum value of 30 ng/mL or higher. Aim for 1000 to 1200 mg of elemental calcium daily through supplements and/or dietary sources.  SVT (HCC)/atrial fib Continue Eliquis Cardiology managing Report to the ER if any chest pain, shortness of breath, nausea, dizziness, severe HA, changes vision/speech  Flu vaccine need Administered - patient tolerated well  Orders Placed This Encounter  Procedures   Flu vaccine HIGH DOSE PF   CBC with Differential/Platelet   COMPLETE METABOLIC PANEL WITH GFR   Lipid panel   Hemoglobin A1c   Notify office for further evaluation and treatment, questions or concerns if any reported s/s fail to improve.   The patient was advised to call back or seek an in-person evaluation if any symptoms worsen or if the condition fails to improve as anticipated.   Further disposition pending results of labs.  Discussed med's effects and SE's.    I discussed the assessment and treatment plan with the patient. The patient was provided an opportunity to ask questions and all were  answered. The patient agreed with the plan and demonstrated an understanding of the instructions.  Discussed med's effects and SE's. Screening labs and tests as requested with regular follow-up as recommended.  I provided 30 minutes of face-to-face time during this encounter including counseling, chart review, and critical decision making was preformed.  Today's Plan of Care is based on a patient-centered health care approach known as shared decision making - the decisions, tests and treatments allow for patient preferences and values to be balanced with clinical evidence.    Future Appointments  Date Time Provider Department Center  02/21/2023 10:00 AM Lucky Cowboy, MD [facility] None  02/28/2023  9:20 AM Pricilla Riffle, MD [facility]  05/23/2023  4:00 PM Adela Glimpse, NP [facility] None   Subjective:   Kelsey Lopez is a 82 y.o. female who presents for a 3 month follow up. She has Hyperlipidemia, mixed; Essential hypertension; Anxiety; Osteoarthritis; Abnormal glucose; Medication management; Vitamin D deficiency; Overweight (BMI 25.0-29.9); Insomnia; Depression, major, in remission (HCC); CKD (chronic kidney disease) stage 2, GFR 60-89 ml/min; Unilateral primary osteoarthritis, left knee; Unilateral primary osteoarthritis, right knee; Osteopenia; SVT (supraventricular tachycardia) (HCC); High risk medication use; Elevated LFTs; Persistent atrial fibrillation (HCC); Thrombophilia (HCC); and Paroxysmal A-fib (HCC) on their problem list.  Overall she reports feeling well today.  She has no new or additional concerns at this time.    She does have a hx of SVT and persistent atrial fibrillation.  Currently on Eliquis 5 mg BID.  Follows with Cardiology, last seen 04/13/2022. She is to continue current medication  regimen.  Remains in sinus rhythm on EKG.  She has hx of second hand smoke exposure, prone to bronchitis, normal CXRs, no problems since on Singulair, zyrtec and mucinex.   She is on celexa for anxiety, started after her husband passed and continues to perceive benefit, she is on xanax PRN (takes 1/2 tab 3-4 days a week  if very stressed) and she is on trazodone 150 mg daily at night. Very involved in church.   Following with Dr. Roda Shutters for knee pain, bone on bone, also degenerative scoliosis, did see neurosurgery but doesn't want surgery, PT helped and doing well with occasional injections, just had last week and currently pain is very well controlled. Manges with heat, rest, Meloxicam, occasional PRN via ortho.   BMI is Body mass index is 24.93 kg/m., she has been working on diet and exercise. Wt Readings from Last 3 Encounters:  11/17/22 149 lb 12.8 oz (67.9 kg)  09/28/22 147 lb (66.7 kg)  08/16/22 146 lb 12.8 oz (66.6 kg)   She admits hasn't been checking BPs at home, does have cuff, overall well controlled, today their BP is BP: 136/70.  She does not workout. She denies chest pain, shortness of breath, dizziness.   She is on cholesterol medication (simvastatin 40 mg daily) and denies myalgias. Her cholesterol is at goal. The cholesterol last visit was:   Lab Results  Component Value Date   CHOL 176 08/16/2022   HDL 74 08/16/2022   LDLCALC 83 08/16/2022   TRIG 98 08/16/2022   CHOLHDL 2.4 08/16/2022   Last I6N in the office was:  Lab Results  Component Value Date   HGBA1C 5.6 08/16/2022    She has CKD II monitored at this office; last GFR:  Lab Results  Component Value Date   EGFR 83 08/16/2022   Patient is on Vitamin D supplement, 2000 IU.   Lab Results  Component Value Date  VD25OH 70 08/16/2022      Medication Review   Current Outpatient Medications (Cardiovascular):    diltiazem (CARDIZEM CD) 240 MG 24 hr capsule, Take  1 tablet  Daily for Heart Rhythm                                                                      /                                                                   TAKE                                         BY                                                 MOUTH   metoprolol succinate (TOPROL XL) 25 MG 24 hr tablet, Take 1 tablet (25 mg total) by mouth daily.   rosuvastatin (CRESTOR) 20 MG tablet, Take 1 tablet (20 mg total) by mouth daily.  Current Outpatient Medications (Respiratory):    cetirizine (ZYRTEC) 10 MG tablet, Take 10 mg by mouth daily.   GUAIFENESIN ER PO, Take 400 mg by mouth every morning. Mucinex   montelukast (SINGULAIR) 10 MG tablet, TAKE 1 TABLET BY MOUTH DAILY FOR ALLERGIES  Current Outpatient Medications (Analgesics):    meloxicam (MOBIC) 15 MG tablet, TAKE 1/2 TO 1 TABLET DAILY WITH FOOD FOR PAIN & INFLAMMATION   traMADol (ULTRAM) 50 MG tablet, Take 1-2 tablets (50-100 mg total) by mouth daily as needed.  Current Outpatient Medications (Hematological):    ELIQUIS 5 MG TABS tablet, TAKE 1 TABLET 2 TIMES A DAY (EVERY 12 HR) TO PREVENT BLOOD CLOTS  Current Outpatient Medications (Other):    ALPRAZolam (XANAX) 0.5 MG tablet, 1/2-1 TABLET AT BEDTIME ONLY IF NEEDED FOR SLEEP. LIMIT 5 DAYS/WEEK TO AVOID ADDICTION & DEMENTIA   citalopram (CELEXA) 20 MG tablet, TAKE 1 TABLET  EVERY DAY FOR MOOD   Flaxseed, Linseed, (FLAX SEED OIL) 1000 MG CAPS, Take 1,000 mg by mouth daily.   Magnesium 400 MG CAPS, Take 400 mg by mouth daily.   Multiple Vitamin (MULTIVITAMIN) tablet, Take 1 tablet by mouth daily.   NON FORMULARY, Take 1,800 mg by mouth daily. Calcium 1800 mg   tiZANidine (ZANAFLEX) 4 MG tablet, TAKE 1 TABLET (4 MG TOTAL) BY MOUTH EVERY 8 (EIGHT) HOURS AS NEEDED FOR MUSCLE SPASMS   traZODone (DESYREL) 150 MG tablet, TAKE 1/2 TO 1 TABLET 1 HOUR BEFORE BEDTIME IF NEEDED FOR SLEEP (Patient taking differently: Take 150 mg by mouth at bedtime.)   vitamin C (ASCORBIC ACID) 500 MG tablet, Take 500 mg by mouth  daily.  Current Problems (verified) Patient Active Problem List   Diagnosis Date Noted   Thrombophilia (  HCC) 08/16/2022   Paroxysmal A-fib (HCC) 08/16/2022   Persistent atrial fibrillation (HCC) 03/25/2022   Elevated LFTs 05/13/2021   High risk medication use 05/12/2021   SVT (supraventricular tachycardia) (HCC) 07/12/2020   Osteopenia 04/15/2020   Unilateral primary osteoarthritis, left knee 05/04/2019   Unilateral primary osteoarthritis, right knee 05/04/2019   CKD (chronic kidney disease) stage 2, GFR 60-89 ml/min 01/14/2017   Depression, major, in remission (HCC) 11/27/2014   Overweight (BMI 25.0-29.9) 04/10/2014   Insomnia 04/10/2014   Vitamin D deficiency 01/07/2014   Abnormal glucose 01/03/2014   Medication management 01/03/2014   Anxiety    Osteoarthritis    Hyperlipidemia, mixed 06/05/2009   Essential hypertension 06/05/2009    Immunization History  Administered Date(s) Administered   Influenza Split 11/15/2011, 11/28/2012, 10/31/2013, 01/07/2014   Influenza, High Dose Seasonal PF 11/27/2014, 11/28/2015, 09/29/2016, 10/30/2017, 10/01/2018, 10/23/2019, 10/14/2020, 11/25/2021, 11/17/2022   Influenza-Unspecified 09/29/2016, 10/01/2018   PFIZER Comirnaty(Gray Top)Covid-19 Tri-Sucrose Vaccine 05/12/2020   PFIZER(Purple Top)SARS-COV-2 Vaccination 03/02/2019, 03/23/2019, 11/20/2019   Pfizer Covid-19 Vaccine Bivalent Booster 63yrs & up 12/01/2020, 06/11/2021, 12/01/2021   Pneumococcal Conjugate-13 10/02/2013   Pneumococcal Polysaccharide-23 08/03/2017   Respiratory Syncytial Virus Vaccine,Recomb Aduvanted(Arexvy) 12/01/2021   Tdap 08/01/2013   Zoster Recombinant(Shingrix) 11/10/2016, 01/10/2017   Zoster, Live 05/25/2011   Health Maintenance  Topic Date Due   DEXA SCAN  12/26/2021   MAMMOGRAM  06/03/2022   COVID-19 Vaccine (8 - 2023-24 season) 10/10/2022   Medicare Annual Wellness (AWV)  05/17/2023   DTaP/Tdap/Td (2 - Td or Tdap) 08/02/2023   Pneumonia Vaccine 31+  Years old  Completed   INFLUENZA VACCINE  Completed   Zoster Vaccines- Shingrix  Completed   HPV VACCINES  Aged Out   Patient Care Team: Lucky Cowboy, MD as PCP - General (Internal Medicine) Cherlyn Roberts, MD as Consulting Physician (Dermatology) Vida Rigger, MD as Consulting Physician (Gastroenterology) Pricilla Riffle, MD as Consulting Physician (Cardiology) Tarry Kos, MD as Attending Physician (Orthopedic Surgery)   Allergies Allergies  Allergen Reactions   Ace Inhibitors     Cough   Ampicillin     rash   Mevacor [Lovastatin]     Elevated LFT's   Penicillins     REACTION: hives and  itching   Sulfa Antibiotics     rash    SURGICAL HISTORY She  has a past surgical history that includes Tonsillectomy; Vesicovaginal fistula closure w/ TAH; Knee arthroscopy; Abdominal hysterectomy; Cataract extraction (Right, 2019); and Cardioversion (N/A, 03/25/2022). FAMILY HISTORY Her family history includes Cancer in her father; Diabetes in her brother; Heart attack in her mother; Heart disease in her mother; Hyperlipidemia in her brother; Osteoarthritis in her father and mother. SOCIAL HISTORY She  reports that she has never smoked. She has never used smokeless tobacco. She reports current alcohol use.  Objective:     Blood pressure 136/70, pulse 62, temperature 97.8 F (36.6 C), height 5\' 5"  (1.651 m), weight 149 lb 12.8 oz (67.9 kg), SpO2 98%. Body mass index is 24.93 kg/m.  General appearance: alert, no distress, WD/WN,  female HEENT: normocephalic, sclerae anicteric, TMs pearly, nares patent, no discharge or erythema, pharynx normal Oral cavity: MMM, no lesions Neck: supple, no lymphadenopathy, no thyromegaly, no masses Heart: RRR, normal S1, S2, no murmurs Lungs: CTA bilaterally, no wheezes, rhonchi, or rales Abdomen: +bs, soft, non tender, non distended, no masses, no hepatomegaly, no splenomegaly Musculoskeletal: nontender, no swelling, mild scoliosis Extremities:  no edema, no cyanosis, no clubbing Pulses: 2+ symmetric, upper and lower  extremities, normal cap refill Neurological: alert, oriented x 3, CN2-12 intact, strength normal upper extremities and lower extremities, sensation normal throughout, DTRs 2+ throughout, no cerebellar signs, gait slow steady Skin: erythematous rash around bilateral eyes Psychiatric: normal affect, behavior normal, pleasant  Breast:  defer Rectal: defer  Adela Glimpse, NP   11/17/2022

## 2022-11-18 LAB — LIPID PANEL
Cholesterol: 160 mg/dL (ref <200)
HDL: 74 mg/dL (ref >=50)
LDL Cholesterol (Calc): 71 mg/dL
Non-HDL Cholesterol (Calc): 86 mg/dL (ref <130)
Total CHOL/HDL Ratio: 2.2 (calc) (ref <5.0)
Triglycerides: 73 mg/dL (ref <150)

## 2022-11-18 LAB — CBC WITH DIFFERENTIAL/PLATELET
Absolute Monocytes: 638 {cells}/uL (ref 200–950)
Basophils Absolute: 44 {cells}/uL (ref 0–200)
Basophils Relative: 0.4 %
Eosinophils Absolute: 165 {cells}/uL (ref 15–500)
Eosinophils Relative: 1.5 %
HCT: 41.9 % (ref 35.0–45.0)
Hemoglobin: 13.7 g/dL (ref 11.7–15.5)
Lymphs Abs: 968 {cells}/uL (ref 850–3900)
MCH: 32.1 pg (ref 27.0–33.0)
MCHC: 32.7 g/dL (ref 32.0–36.0)
MCV: 98.1 fL (ref 80.0–100.0)
MPV: 10.1 fL (ref 7.5–12.5)
Monocytes Relative: 5.8 %
Neutro Abs: 9185 {cells}/uL — ABNORMAL HIGH (ref 1500–7800)
Neutrophils Relative %: 83.5 %
Platelets: 226 10*3/uL (ref 140–400)
RBC: 4.27 10*6/uL (ref 3.80–5.10)
RDW: 12.5 % (ref 11.0–15.0)
Total Lymphocyte: 8.8 %
WBC: 11 10*3/uL — ABNORMAL HIGH (ref 3.8–10.8)

## 2022-11-18 LAB — COMPLETE METABOLIC PANEL WITH GFR
AG Ratio: 1.9 (calc) (ref 1.0–2.5)
ALT: 22 U/L (ref 6–29)
AST: 21 U/L (ref 10–35)
Albumin: 4 g/dL (ref 3.6–5.1)
Alkaline phosphatase (APISO): 92 U/L (ref 37–153)
BUN: 12 mg/dL (ref 7–25)
CO2: 27 mmol/L (ref 20–32)
Calcium: 9.7 mg/dL (ref 8.6–10.4)
Chloride: 104 mmol/L (ref 98–110)
Creat: 0.7 mg/dL (ref 0.60–0.95)
Globulin: 2.1 g/dL (ref 1.9–3.7)
Glucose, Bld: 133 mg/dL — ABNORMAL HIGH (ref 65–99)
Potassium: 3.6 mmol/L (ref 3.5–5.3)
Sodium: 140 mmol/L (ref 135–146)
Total Bilirubin: 0.5 mg/dL (ref 0.2–1.2)
Total Protein: 6.1 g/dL (ref 6.1–8.1)
eGFR: 86 mL/min/{1.73_m2} (ref >=60)

## 2022-11-18 LAB — HEMOGLOBIN A1C
Hgb A1c MFr Bld: 5.4 %{Hb} (ref <5.7)
Mean Plasma Glucose: 108 mg/dL
eAG (mmol/L): 6 mmol/L

## 2023-02-07 ENCOUNTER — Encounter: Payer: Medicare Other | Admitting: Internal Medicine

## 2023-02-12 ENCOUNTER — Other Ambulatory Visit: Payer: Self-pay | Admitting: Nurse Practitioner

## 2023-02-12 DIAGNOSIS — I4819 Other persistent atrial fibrillation: Secondary | ICD-10-CM

## 2023-02-20 ENCOUNTER — Encounter: Payer: Self-pay | Admitting: Internal Medicine

## 2023-02-20 NOTE — Progress Notes (Signed)
 N  O       S  H  O  W C  P  E     NEEDS Reschedule CPE   NEEDS 6 mo OV - Zniyah Midkiff   NEEDS 9 mo OV - Bascom MORITA      ADULT   &   ADOLESCENT      INTERNAL MEDICINE  Elsie Richards, M.D.          Lonell Rous, ANP        Bascom Necessary, FNP  [facility] [address]  [place], [place]. [address] Telephone [phone] Telefax [phone]  Annual Screening/Preventative Visit & Comprehensive Evaluation &  Examination   Future Appointments  Date Time Provider Department  02/21/2023 10:00 AM Richards Elsie, MD [facility]  02/28/2023  9:20 AM Okey Vina GAILS, MD [facility]  05/23/2023  4:00 PM Necessary Bascom, NP [facility]  03/06/2024 11:00 AM Richards Elsie, MD [facility]        This very nice 83 y.o.WWF  presents for a Screening /Preventative Visit & comprehensive evaluation and management of multiple medical co-morbidities.  Patient has been followed for HTN, HLD, Prediabetes  and Vitamin D  Deficiency. Patient has hx/o Depression in remission on meds.          HTN predates circa 1996.  Patient  has CKD2  2 0   her HTN . Patient's BP has been controlled at home and patient denies any cardiac symptoms as chest pain, palpitations, shortness of breath, dizziness or ankle swelling. Today's BP is at goal -     . Patient is followed by Dr Vina Okey for pAfib.       Patient's hyperlipidemia is controlled with diet and medications. Patient denies myalgias or other medication SE's. Last lipids were at goal :  Lab Results  Component Value Date   CHOL 174 05/12/2021   HDL 81 05/12/2021    LDLCALC 74 05/12/2021   TRIG 103 05/12/2021   CHOLHDL 2.1 05/12/2021         Patient has hx/o prediabetes (A1c 5.8% /2015) and patient denies reactive hypoglycemic symptoms, visual blurring, diabetic polys or paresthesias. Last A1c was normal & at goal :  Lab Results  Component Value Date   HGBA1C 5.3 01/12/2021         Finally, patient has history of Vitamin D  Deficiency and last Vitamin D  was near goal :  Lab Results  Component Value Date   VD25OH 33 01/12/2021       Current Outpatient Medications  Medication Instructions   VITAMIN C 500 mg Daily   cetirizine  10 mg, Oral, Daily,     citalopram  20 MG tablet TAKE 1 TABLET  EVERY DAY FOR MOOD   diltiazem  CD 240 MG 24 hr  Take  1 tablet  Daily   ELIQUIS  5 MG TABS tablet TAKE 1 TABLET 2 TIMES A DAY    Flax Seed    1,000 mg Daily   GUAIFENESIN ER   400 mg -each morning, Mucinex   Magnesium  400 mg\ Daily   meloxicam  15 MG tablet TAKE 1/2 TO 1 TABLET DAILY    metoprolol  succinate XL 25 mg Daily   montelukast  10 MG tablet TAKE 1 TABLET  DAILY   Multiple Vitamin  1 tablet Daily,     Calcium  1800 mg  Daily,    rosuvastatin    20 mg, Oral,  Daily   tiZANidine  4 mg, Oral,  Every 8 hours PRN   traZODone  150 MG tablet TAKE 1/2-1 TAB 1 HR BEFORE BEDTIME      Allergies  Allergen Reactions   Ace Inhibitors     Cough   Ampicillin     rash   Mevacor [Lovastatin]     Elevated LFT's        Penicillins     REACTION: hives and  itching   Sulfa Antibiotics     rash     Past Medical History:  Diagnosis Date   Anxiety    Dyslipidemia    on Rx x20 years   HTN (hypertension)    Hyperlipidemia    Insomnia    Morton's neuroma of left foot    Multiple allergies    Osteoarthritis    Polycythemia    PVC (premature ventricular contraction)      Health Maintenance  Topic Date Due   MAMMOGRAM  12/26/2020   TETANUS/TDAP  08/02/2023   Pneumonia Vaccine 58+ Years old  Completed   INFLUENZA VACCINE  Completed   DEXA SCAN   Completed   COVID-19 Vaccine  Completed   Zoster Vaccines- Shingrix  Completed   HPV VACCINES  Aged Out     Immunization History  Administered Date(s) Administered   Influenza Split 11/15/2011, 11/28/2012, 10/31/2013, 01/07/2014   Influenza, High Dose Seasonal PF 10/30/2017, 10/01/2018, 10/23/2019, 10/14/2020   Influenza 09/29/2016, 10/01/2018   PFIZER Covid-19 Tri-Sucrose Vacc 05/12/2020   PFIZER SARS-COV-2 Vacci 03/02/2019, 03/23/2019, 11/20/2019   Pfizer Covid-19 VaccBivalent Booster  12/01/2020   Pneumococcal -13 10/02/2013   Pneumococcal -23 08/03/2017   Tdap 08/01/2013   Zoster Recombinat (Shingrix) 11/10/2016, 01/10/2017   Zoster, Live 05/25/2011     Last Colon - 05/15/2012 - Dr Rosalie - Recc no f/u due to Age   Last MGM - 06/02/2021 - overdue  Last dexa BMD - 12/27/2019  - Osteopenia T score -2.3                                                            - Reordered 05/17/2022 - Overdue     Past Surgical History:  Procedure Laterality Date   ABDOMINAL HYSTERECTOMY     CATARACT EXTRACTION Right 2019   Dr. Leslee    KNEE ARTHROSCOPY  R   TONSILLECTOMY     VESICOVAGINAL FISTULA CLOSURE W/ TAH       Family History  Problem Relation Age of Onset   Osteoarthritis Mother    Heart attack Mother    Heart disease Mother    Cancer Father        lung   Osteoarthritis Father    Hyperlipidemia Brother    Diabetes Brother      Social History   Tobacco Use   Smoking status: Never   Smokeless tobacco: Never   Tobacco comments:    no tobacco   Substance Use Topics   Alcohol use: Yes    Comment: rare      ROS Constitutional: Denies fever, chills, weight loss/gain, headaches, insomnia,  night sweats, and change in appetite. Does c/o fatigue. Eyes: Denies redness, blurred vision, diplopia, discharge, itchy, watery eyes.  ENT: Denies discharge, congestion, post nasal drip, epistaxis, sore throat, earache, hearing loss, dental pain, Tinnitus, Vertigo,  Sinus pain, snoring.  Cardio: Denies chest pain, palpitations, irregular heartbeat, syncope, dyspnea, diaphoresis, orthopnea, PND, claudication, edema Respiratory: denies cough, dyspnea, DOE, pleurisy, hoarseness, laryngitis, wheezing.  Gastrointestinal: Denies dysphagia, heartburn, reflux, water brash, pain, cramps, nausea, vomiting, bloating, diarrhea, constipation, hematemesis, melena, hematochezia, jaundice, hemorrhoids Genitourinary: Denies dysuria, frequency, urgency, nocturia, hesitancy, discharge, hematuria, flank pain Breast: Breast lumps, nipple discharge, bleeding.  Musculoskeletal: Denies arthralgia, myalgia, stiffness, Jt. Swelling, pain, limp, and strain/sprain. Denies falls. Skin: Denies puritis, rash, hives, warts, acne, eczema, changing in skin lesion Neuro: No weakness, tremor, incoordination, spasms, paresthesia, pain Psychiatric: Denies confusion, memory loss, sensory loss. Denies Depression. Endocrine: Denies change in weight, skin, hair change, nocturia, and paresthesia, diabetic polys, visual blurring, hyper / hypo glycemic episodes.  Heme/Lymph: No excessive bleeding, bruising, enlarged lymph nodes.  Physical Exam  There were no vitals taken for this visit.  General Appearance: Well nourished, well groomed and in no apparent distress.  Eyes: PERRLA, EOMs, conjunctiva no swelling or erythema, normal fundi and vessels. Sinuses: No frontal/maxillary tenderness ENT/Mouth: EACs patent / TMs  nl. Nares clear without erythema, swelling, mucoid exudates. Oral hygiene is good. No erythema, swelling, or exudate. Tongue normal, non-obstructing. Tonsils not swollen or erythematous. Hearing normal.  Neck: Supple, thyroid  not palpable. No bruits, nodes or JVD. Respiratory: Respiratory effort normal.  BS equal and clear bilateral without rales, rhonci, wheezing or stridor. Cardio: Heart sounds are soft with irregular rate and rhythm and no murmurs, rubs or gallops. Peripheral pulses  are normal and equal bilaterally without edema. No aortic or femoral bruits. Chest: symmetric with normal excursions and percussion. Breasts: Deferred to upcoming MGM. Abdomen: Flat, soft with bowel sounds active. Nontender, no guarding, rebound, hernias, masses, or organomegaly.  Lymphatics: Non tender without lymphadenopathy.  Musculoskeletal: Full ROM all peripheral extremities, joint stability, 5/5 strength, and normal gait. Skin: Warm and dry without rashes, lesions, cyanosis, clubbing or  ecchymosis.  Neuro: Cranial nerves intact, reflexes equal bilaterally. Normal muscle tone, no cerebellar symptoms. Sensation intact.  Pysch: Alert and oriented X 3, normal affect, Insight and Judgment appropriate.    Assessment and Plan  1. Annual Preventative Screening Examination   2. Essential hypertension  - EKG 12-Lead - ? PAT with variable block   - Will hold her Losartan  & start on Diltiazem  & have patient return in 1 week to repeat EKG .    ( Patient given strict ER Precautions for ANY cardiac type sx's )  3. Hyperlipidemia, mixed  - EKG 12-Lead - Lipid panel -  TSH   4. Abnormal glucose  - EKG 12-Lead - Hemoglobin A1c - Insulin , random   5. Vitamin D  deficiency  - EKG 12-Lead - VITAMIN D  25 Hydroxy    6. Depression, major, in remission (HCC)  - TSH   7. Osteopenia of multiple sites  - COMPLETE METABOLIC PANEL WITH GFR   8. Stage 2 chronic kidney disease  - Urinalysis, Routine w reflex microscopic - Microalbumin / creatinine urine ratio - COMPLETE METABOLIC PANEL WITH GFR   9. Screening for colorectal cancer  - POC Hemoccult Bld/Stl    10. Screening for heart disease  - EKG 12-Lead   11. FHx: heart disease  - EKG 12-Lead   12. Medication management  - Urinalysis, Routine w reflex microscopic - Microalbumin / creatinine urine ratio - CBC with Differential/Platelet - COMPLETE METABOLIC PANEL WITH GFR - Magnesium - Lipid panel - TSH -  Hemoglobin A1c - Insulin , random - VITAMIN D  25 Hydroxy            Patient was counseled in prudent diet to achieve/maintain BMI less than 25 for weight control, BP monitoring, regular exercise and medications. Discussed med's effects and SE's. Screening labs and tests as requested with regular follow-up as recommended. Over 40 minutes of exam, counseling, chart review and high complex critical decision making was performed.   Elsie JONETTA Richards, MD

## 2023-02-21 ENCOUNTER — Ambulatory Visit: Payer: Medicare Other | Admitting: Internal Medicine

## 2023-02-21 DIAGNOSIS — N182 Chronic kidney disease, stage 2 (mild): Secondary | ICD-10-CM

## 2023-02-21 DIAGNOSIS — R7309 Other abnormal glucose: Secondary | ICD-10-CM

## 2023-02-21 DIAGNOSIS — M8580 Other specified disorders of bone density and structure, unspecified site: Secondary | ICD-10-CM

## 2023-02-21 DIAGNOSIS — Z136 Encounter for screening for cardiovascular disorders: Secondary | ICD-10-CM

## 2023-02-21 DIAGNOSIS — E559 Vitamin D deficiency, unspecified: Secondary | ICD-10-CM

## 2023-02-21 DIAGNOSIS — I480 Paroxysmal atrial fibrillation: Secondary | ICD-10-CM

## 2023-02-21 DIAGNOSIS — F325 Major depressive disorder, single episode, in full remission: Secondary | ICD-10-CM

## 2023-02-21 DIAGNOSIS — Z0001 Encounter for general adult medical examination with abnormal findings: Secondary | ICD-10-CM

## 2023-02-21 DIAGNOSIS — Z1211 Encounter for screening for malignant neoplasm of colon: Secondary | ICD-10-CM

## 2023-02-21 DIAGNOSIS — I10 Essential (primary) hypertension: Secondary | ICD-10-CM

## 2023-02-21 DIAGNOSIS — Z79899 Other long term (current) drug therapy: Secondary | ICD-10-CM

## 2023-02-21 DIAGNOSIS — E782 Mixed hyperlipidemia: Secondary | ICD-10-CM

## 2023-02-27 NOTE — Progress Notes (Unsigned)
Cardiology Office Note   Date:  02/28/2023   ID:  July, Czepiel Dec 13, 1940, MRN [id number]  PCP:  Lucky Cowboy, MD  Cardiologist:   Dietrich Pates, MD   Patient presents for follow up of atrial fibrillation     History of Present Illness: Kelsey Lopez is a 83 y.o. female with a history of HTN, HL, prediabetes,  IN Jan 2024 she was at  Dr [facility] office, found to be in afib   Placed on Eliquis   In Feb  2024 she underwent DCCV  I saw the pt in May 2024  She was set up for a monitor   Mailed in in but never received  I saw the pt in Aug 2024  Since seen she says she feels good   Denies palpitations   Breathing is good   She remains active    No dizziness     Current Meds  Medication Sig   cetirizine (ZYRTEC) 10 MG tablet Take 10 mg by mouth daily.   citalopram (CELEXA) 20 MG tablet TAKE 1 TABLET  EVERY DAY FOR MOOD   diltiazem (CARDIZEM CD) 240 MG 24 hr capsule Take  1 tablet  Daily for Heart Rhythm                                                                     /                                                                   TAKE                                         BY                                                 MOUTH   ELIQUIS 5 MG TABS tablet TAKE 1 TABLET 2 TIMES A DAY (EVERY 12 HR) TO PREVENT BLOOD CLOTS   Flaxseed, Linseed, (FLAX SEED OIL) 1000 MG CAPS Take 1,000 mg by mouth daily.   GUAIFENESIN ER PO Take 400 mg by mouth every morning. Mucinex   Magnesium 400 MG CAPS Take 400 mg by mouth daily.   meloxicam (MOBIC) 15 MG tablet TAKE 1/2 TO 1 TABLET DAILY WITH FOOD FOR PAIN & INFLAMMATION   metoprolol succinate (TOPROL XL) 25 MG 24 hr tablet Take 1 tablet (25 mg total) by mouth daily.   montelukast (SINGULAIR) 10 MG tablet TAKE 1 TABLET BY MOUTH DAILY FOR ALLERGIES   Multiple Vitamin (MULTIVITAMIN) tablet Take 1 tablet by mouth daily.   NON FORMULARY Take 1,800 mg by mouth daily. Calcium 1800 mg   rosuvastatin (CRESTOR) 20 MG tablet Take 1 tablet (20 mg  total) by mouth daily.   tiZANidine (ZANAFLEX) 4  MG tablet TAKE 1 TABLET (4 MG TOTAL) BY MOUTH EVERY 8 (EIGHT) HOURS AS NEEDED FOR MUSCLE SPASMS   traZODone (DESYREL) 150 MG tablet TAKE 1/2 TO 1 TABLET 1 HOUR BEFORE BEDTIME IF NEEDED FOR SLEEP (Patient taking differently: Take 150 mg by mouth at bedtime.)   vitamin C (ASCORBIC ACID) 500 MG tablet Take 500 mg by mouth daily.     Allergies:   Ace inhibitors, Ampicillin, Mevacor [lovastatin], Penicillins, and Sulfa antibiotics   Past Medical History:  Diagnosis Date   Anxiety    Dyslipidemia    on Rx x20 years   HTN (hypertension)    Hyperlipidemia    Insomnia    Morton's neuroma of left foot    Multiple allergies    Osteoarthritis    Polycythemia    PVC (premature ventricular contraction)     Past Surgical History:  Procedure Laterality Date   ABDOMINAL HYSTERECTOMY     CARDIOVERSION N/A 03/25/2022   Procedure: CARDIOVERSION;  Surgeon: Quintella Reichert, MD;  Location: [facility];  Service: Cardiovascular;  Laterality: N/A;   CATARACT EXTRACTION Right 2019   Dr. Charlotte Sanes    KNEE ARTHROSCOPY     R   TONSILLECTOMY     VESICOVAGINAL FISTULA CLOSURE W/ TAH       Social History:  The patient  reports that she has never smoked. She has never used smokeless tobacco. She reports current alcohol use.   Family History:  The patient's family history includes Cancer in her father; Diabetes in her brother; Heart attack in her mother; Heart disease in her mother; Hyperlipidemia in her brother; Osteoarthritis in her father and mother.    ROS:  Please see the history of present illness. All other systems are reviewed and  Negative to the above problem except as noted.    PHYSICAL EXAM: VS:  BP (!) 142/86   Pulse (!) 111   Ht 5' 5.5" (1.664 m)   Wt 147 lb 6.4 oz (66.9 kg)   SpO2 96%   BMI 24.16 kg/m   GEN: Well nourished, well developed,in NAD  HEENT: normal  Neck: JVP is not elevated   Cardiac:   Irreg irreg   No S3  No murmurs     GI: soft, nontender, No hepatomegaly  Ext  no LE edema   EKG:  EKG shows Atrial fibrillation   107 bpm    ST T wave changes, cannot exclude ischemia  Echo June 02, 2022    1. Left ventricular ejection fraction by 3D volume is 62 %. The left  ventricle has no regional wall motion abnormalities. Left ventricular  diastolic parameters were normal. The average left ventricular global  longitudinal strain is -17.9 %. The global  longitudinal strain is at the lower limit of normal.   2. Right ventricular systolic function is normal. The right ventricular  size is normal. There is normal pulmonary artery systolic pressure.   3. Left atrial size was severely dilated.   4. The mitral valve is myxomatous. Moderate mitral valve regurgitation.  There is mild late systolic prolapse of both leaflets of the mitral valve.   5. The aortic valve is tricuspid. Aortic valve regurgitation is trivial.  No aortic stenosis is present.   6. The inferior vena cava is normal in size with greater than 50%  respiratory variability, suggesting right atrial pressure of 3 mmHg.   7. The markedly increased E/A ratio is likely related to atrial  mechanical failure or stunning after a recent  episode of atrial  fibrillation. the diastolic mitral annulus velocities suggest normal LV  diastolic function.   Feb 2024   1. Left ventricular ejection fraction, by estimation, is 60 to 65%. The  left ventricle has normal function. The left ventricle has no regional  wall motion abnormalities. There is mild concentric left ventricular  hypertrophy. Left ventricular diastolic  parameters are consistent with Grade III diastolic dysfunction  (restrictive).   2. Right ventricular systolic function is normal. The right ventricular  size is normal. There is moderately elevated pulmonary artery systolic  pressure. The estimated right ventricular systolic pressure is 54.0 mmHg.   3. Left atrial size was severely dilated.   4.  Right atrial size was mildly dilated.   5. The mitral valve is normal in structure. Moderate mitral valve  regurgitation. No evidence of mitral stenosis.   6. Tricuspid valve regurgitation is mild to moderate.   7. The aortic valve is tricuspid. There is mild calcification of the  aortic valve. Aortic valve regurgitation is not visualized. No aortic  stenosis is present.   8. The inferior vena cava is normal in size with <50% respiratory  variability, suggesting right atrial pressure of 8 mmHg.    Lipid Panel    Component Value Date/Time   CHOL 160 11/17/2022 1052   TRIG 73 11/17/2022 1052   HDL 74 11/17/2022 1052   CHOLHDL 2.2 11/17/2022 1052   VLDL 18 06/28/2016 1149   LDLCALC 71 11/17/2022 1052      Wt Readings from Last 3 Encounters:  02/28/23 147 lb 6.4 oz (66.9 kg)  11/17/22 149 lb 12.8 oz (67.9 kg)  09/28/22 147 lb (66.7 kg)      ASSESSMENT AND PLAN:  1  Atrial fib  Pt found to be in afib in Jan 2024  Underwent DCCV   Today she is back in atrial fibrillation with rapid response    The pt does not sense this    She is breathing well  Remains active   No dizziness Recomm  Given that she has no symptoms I would recomm rate control and anticoagulation Will set up for a 72 hour Zio patch to evaluate HR control  2 HTN  BP is controlled    3  Mitral regurgitation.   Moderate on echo  Follow clinically and with periodic echoes    4  HL  Continue on Crestor     Current medicines are reviewed at length with the patient today.  The patient does not have concerns regarding medicines.  Signed, Dietrich Pates, MD  02/28/2023 10:35 PM    [facility] [address] [place], [place], [place]  [address] Phone: [phone]; Fax: [phone]

## 2023-02-28 ENCOUNTER — Ambulatory Visit: Payer: Medicare Other | Attending: Internal Medicine | Admitting: Internal Medicine

## 2023-02-28 ENCOUNTER — Ambulatory Visit (INDEPENDENT_AMBULATORY_CARE_PROVIDER_SITE_OTHER): Payer: Medicare Other

## 2023-02-28 ENCOUNTER — Encounter: Payer: Self-pay | Admitting: Internal Medicine

## 2023-02-28 VITALS — BP 142/86 | HR 111 | Ht 65.5 in | Wt 147.4 lb

## 2023-02-28 DIAGNOSIS — I4891 Unspecified atrial fibrillation: Secondary | ICD-10-CM

## 2023-02-28 DIAGNOSIS — R000 Tachycardia, unspecified: Secondary | ICD-10-CM | POA: Diagnosis not present

## 2023-02-28 NOTE — Patient Instructions (Addendum)
Medication Instructions:    *If you need a refill on your cardiac medications before your next appointment, please call your pharmacy*   Lab Work: If you have labs (blood work) drawn today and your tests are completely normal, you will receive your results only by: MyChart Message (if you have MyChart) OR A paper copy in the mail If you have any lab test that is abnormal or we need to change your treatment, we will call you to review the results.   Testing/Procedures:  Kelsey Lopez- Long Term Monitor Instructions  Your physician has requested you wear a ZIO patch monitor for 3 days.  This is a single patch monitor. [employer] supplies one patch monitor per enrollment. Additional stickers are not available. Please do not apply patch if you will be having a Nuclear Stress Test,  Echocardiogram, Cardiac CT, MRI, or Chest Xray during the period you would be wearing the  monitor. The patch cannot be worn during these tests. You cannot remove and re-apply the  ZIO XT patch monitor.  Your ZIO patch monitor will be mailed 3 day USPS to your address on file. It may take 3-5 days  to receive your monitor after you have been enrolled.  Once you have received your monitor, please review the enclosed instructions. Your monitor  has already been registered assigning a specific monitor serial # to you.  Billing and Patient Assistance Program Information  We have supplied [employer] with any of your insurance information on file for billing purposes. [employer] offers a sliding scale Patient Assistance Program for patients that do not have  insurance, or whose insurance does not completely cover the cost of the ZIO monitor.  You must apply for the Patient Assistance Program to qualify for this discounted rate.  To apply, please call [employer] at [phone], select option 4, select option 2, ask to apply for  Patient Assistance Program. Meredeth Ide will ask your household income, and how many people  are in your  household. They will quote your out-of-pocket cost based on that information.  [employer] will also be able to set up a 54-month, interest-free payment plan if needed.  Applying the monitor   Shave hair from upper left chest.  Hold abrader disc by orange tab. Rub abrader in 40 strokes over the upper left chest as  indicated in your monitor instructions.  Clean area with 4 enclosed alcohol pads. Let dry.  Apply patch as indicated in monitor instructions. Patch will be placed under collarbone on left  side of chest with arrow pointing upward.  Rub patch adhesive wings for 2 minutes. Remove white label marked "1". Remove the white  label marked "2". Rub patch adhesive wings for 2 additional minutes.  While looking in a mirror, press and release button in center of patch. A small green light will  flash 3-4 times. This will be your only indicator that the monitor has been turned on.  Do not shower for the first 24 hours. You may shower after the first 24 hours.  Press the button if you feel a symptom. You will hear a small click. Record Date, Time and  Symptom in the Patient Logbook.  When you are ready to remove the patch, follow instructions on the last 2 pages of Patient  Logbook. Stick patch monitor onto the last page of Patient Logbook.  Place Patient Logbook in the blue and white box. Use locking tab on box and tape box closed  securely. The blue and white box has  prepaid postage on it. Please place it in the mailbox as  soon as possible. Your physician should have your test results approximately 7 days after the  monitor has been mailed back to [facility].  Call [facility] Customer Care at [phone] if you have questions regarding  your ZIO XT patch monitor. Call them immediately if you see an orange light blinking on your  monitor.  If your monitor falls off in less than 4 days, contact our Monitor department at [phone].  If your monitor becomes loose or falls off after  4 days call [employer] at [phone] for  suggestions on securing your monitor     Follow-Up: At [facility], you and your health needs are our priority.  As part of our continuing mission to provide you with exceptional heart care, we have created designated Provider Care Teams.  These Care Teams include your primary Cardiologist (physician) and Advanced Practice Providers (APPs -  Physician Assistants and Nurse Practitioners) who all work together to provide you with the care you need, when you need it.  We recommend signing up for the patient portal called "MyChart".  Sign up information is provided on this After Visit Summary.  MyChart is used to connect with patients for Virtual Visits (Telemedicine).  Patients are able to view lab/test results, encounter notes, upcoming appointments, etc.  Non-urgent messages can be sent to your provider as well.   To learn more about what you can do with MyChart, go to [email or web address].    Your next appointment:  AUGUST OR SEPTEMBER 2025

## 2023-02-28 NOTE — Progress Notes (Unsigned)
Enrolled for [employer] to mail a ZIO XT long term holter monitor to the patients address on file.  

## 2023-03-07 DIAGNOSIS — L570 Actinic keratosis: Secondary | ICD-10-CM | POA: Diagnosis not present

## 2023-03-07 DIAGNOSIS — D485 Neoplasm of uncertain behavior of skin: Secondary | ICD-10-CM | POA: Diagnosis not present

## 2023-03-07 DIAGNOSIS — L821 Other seborrheic keratosis: Secondary | ICD-10-CM | POA: Diagnosis not present

## 2023-03-07 DIAGNOSIS — D3617 Benign neoplasm of peripheral nerves and autonomic nervous system of trunk, unspecified: Secondary | ICD-10-CM | POA: Diagnosis not present

## 2023-03-07 DIAGNOSIS — D225 Melanocytic nevi of trunk: Secondary | ICD-10-CM | POA: Diagnosis not present

## 2023-03-11 DIAGNOSIS — R000 Tachycardia, unspecified: Secondary | ICD-10-CM

## 2023-03-11 DIAGNOSIS — I4891 Unspecified atrial fibrillation: Secondary | ICD-10-CM | POA: Diagnosis not present

## 2023-03-20 ENCOUNTER — Other Ambulatory Visit: Payer: Self-pay | Admitting: Internal Medicine

## 2023-03-20 ENCOUNTER — Other Ambulatory Visit: Payer: Self-pay | Admitting: Nurse Practitioner

## 2023-03-23 DIAGNOSIS — I4891 Unspecified atrial fibrillation: Secondary | ICD-10-CM | POA: Diagnosis not present

## 2023-03-23 DIAGNOSIS — R000 Tachycardia, unspecified: Secondary | ICD-10-CM | POA: Diagnosis not present

## 2023-03-24 ENCOUNTER — Encounter: Payer: Self-pay | Admitting: Cardiovascular Disease

## 2023-05-04 ENCOUNTER — Ambulatory Visit: Admitting: Family Medicine

## 2023-05-23 ENCOUNTER — Ambulatory Visit: Payer: Medicare Other | Admitting: Nurse Practitioner

## 2023-05-30 ENCOUNTER — Encounter: Payer: Self-pay | Admitting: Family Medicine

## 2023-05-30 ENCOUNTER — Ambulatory Visit (INDEPENDENT_AMBULATORY_CARE_PROVIDER_SITE_OTHER): Admitting: Family Medicine

## 2023-05-30 VITALS — BP 128/77 | HR 104 | Ht 65.0 in | Wt 151.0 lb

## 2023-05-30 DIAGNOSIS — I10 Essential (primary) hypertension: Secondary | ICD-10-CM

## 2023-05-30 DIAGNOSIS — F419 Anxiety disorder, unspecified: Secondary | ICD-10-CM

## 2023-05-30 DIAGNOSIS — N182 Chronic kidney disease, stage 2 (mild): Secondary | ICD-10-CM

## 2023-05-30 DIAGNOSIS — R739 Hyperglycemia, unspecified: Secondary | ICD-10-CM

## 2023-05-30 DIAGNOSIS — Z0000 Encounter for general adult medical examination without abnormal findings: Secondary | ICD-10-CM

## 2023-05-30 DIAGNOSIS — E559 Vitamin D deficiency, unspecified: Secondary | ICD-10-CM

## 2023-05-30 DIAGNOSIS — I4819 Other persistent atrial fibrillation: Secondary | ICD-10-CM | POA: Diagnosis not present

## 2023-05-30 DIAGNOSIS — E782 Mixed hyperlipidemia: Secondary | ICD-10-CM | POA: Diagnosis not present

## 2023-05-30 DIAGNOSIS — G8929 Other chronic pain: Secondary | ICD-10-CM

## 2023-05-30 DIAGNOSIS — M545 Low back pain, unspecified: Secondary | ICD-10-CM | POA: Insufficient documentation

## 2023-05-30 DIAGNOSIS — Z79899 Other long term (current) drug therapy: Secondary | ICD-10-CM

## 2023-05-30 MED ORDER — TRAMADOL HCL 50 MG PO TABS: 50.0000 mg | ORAL_TABLET | Freq: Every day | ORAL | 0 refills | Status: AC | PRN

## 2023-05-30 MED ORDER — ALPRAZOLAM 0.5 MG PO TABS: 0.2500 mg | ORAL_TABLET | Freq: Every evening | ORAL | 0 refills | Status: AC | PRN

## 2023-05-30 NOTE — Assessment & Plan Note (Signed)
 At baseline/stable. Continue PRN Zanaflex  and Tramadol ; Meloxicam  - not recommended since on anticoagulation. Home exercises/active lifestyle encouraged

## 2023-05-30 NOTE — Assessment & Plan Note (Signed)
 No SI/HI Stable on Celexa  20 mg and PRN Xanax  Contract/UDS today  PDMP reviewed - refill provided

## 2023-05-30 NOTE — Assessment & Plan Note (Signed)
 Blood pressure is at goal for age and co-morbidities.   Recommendations: Cardizem  CD 240 mg daily, metoprolol  succinate 25 mg daily  - BP goal <130/80 - monitor and log blood pressures at home - check around the same time each day in a relaxed setting - Limit salt to <2000 mg/day - Follow DASH eating plan (heart healthy diet) - limit alcohol to 2 standard drinks per day for men and 1 per day for women - avoid tobacco products - get at least 2 hours of regular aerobic exercise weekly Patient aware of signs/symptoms requiring further/urgent evaluation. Labs updated today. Following with cardiology

## 2023-05-30 NOTE — Progress Notes (Signed)
 New Patient Office Visit  Subjective    Patient ID: Kelsey Lopez, female    DOB: 1940-05-06  Age: 83 y.o. MRN: [id number]  CC:  Chief Complaint  Patient presents with   Establish Care    HPI [name] presents to establish care. She currently lives alone. She has a good support system with her neighbors and has local family as well.   Discussed the use of AI scribe software for clinical note transcription with the patient, who gave verbal consent to proceed.  History of Present Illness Kelsey Lopez "Kelsey Lopez" is an 83 year old female who presents for a new provider visit after the passing of her previous doctor.  She has a history of high cholesterol, high blood pressure, arthritis, and arrhythmias. She underwent cardioversion in 2024 for arrhythmias and has been on diltiazem  and Eliquis  for heart rhythm management and clot prevention. She also takes metoprolol  for rate control. She has a history of atrial fibrillation with rapid ventricular response (AFib RVR) and wore a monitor at home for three days a few months ago, which showed persistent AFib with a controlled heart rate. Her heart rate ranged from 43 to 150, with an average of 76. No sensation of heart racing, chest pain, trouble breathing, or shortness of breath.  She takes rosuvastatin  for cholesterol management.   For anxiety, she takes Xanax  as needed, half to one tablet at bedtime, but has not had a prescription since her previous doctor passed away. She also takes Celexa  for mood stabilization and denies depression, stating she feels fine most of the time.  She experiences back pain due to scoliosis and takes tramadol  about once a week for this, but has not had a prescription refill since her last doctor's passing. She also takes meloxicam  for arthritis and Zanaflex  as needed for muscle spasms.  Her family history includes arthritis, diabetes, high cholesterol, and heart attacks in both parents, with her father also  having had lung cancer.  She lives alone in a townhouse but has family nearby and participates in social activities like playing bridge with neighbors to keep her mind active.     Hypertension, A. Fib, Hyperlipidemia (cardioversion 2024): - Medications: diltiazem  CD 240 mg daily, Eliquis  5 mg BID, metoprolol  succinate 25 mg daily, rosuvastatin  20 mg daily  - Compliance: good - Checking BP at home: no - Denies any SOB, recurrent headaches, CP, vision changes, LE edema, dizziness, palpitations, or medication side effects. - Diet: general - Exercise: minimal but tries to stay active - Following with [facility], Dr. Avanell Bob - At last visit with Cardio, she had elevated HR, but asymptomatic - Zio was ordered and showed persistent A. Fib HR 43-150 with avg HR 76 (03/23/2023)   CKD, stage 2: - Previously stable. Asymptomatic.  Lab Results  Component Value Date   NA 140 11/17/2022   CL 104 11/17/2022   K 3.6 11/17/2022   CO2 27 11/17/2022   BUN 12 11/17/2022   CREATININE 0.70 11/17/2022   EGFR 86 11/17/2022   CALCIUM  9.7 11/17/2022   ALBUMIN 4.2 06/28/2016   GLUCOSE 133 (H) 11/17/2022    Mood follow-up: - Diagnosis: anxiety  - Treatment: Celexa  20 mg daily, Xanax  0.25-0.5 mg daily PRN - Medication side effects: none - SI/HI: no - Update: Doing well. Rarely needing Xanax , but due for refills.    Arthritis, Chronic back pain, scoliosis: - Meloxicam . Tramadol  PRN - using maybe once a week - Staying active.  Outpatient Encounter Medications as of 05/30/2023  Medication Sig   CALCIUM  PO Take 1,800 mg by mouth daily.   cetirizine (ZYRTEC) 10 MG tablet Take 10 mg by mouth daily.   citalopram  (CELEXA ) 20 MG tablet TAKE 1 TABLET  EVERY DAY FOR MOOD   diltiazem  (CARDIZEM  CD) 240 MG 24 hr capsule Take  1 tablet  Daily for Heart Rhythm                                                                     /                                                                   TAKE                                          BY                                                 MOUTH   ELIQUIS  5 MG TABS tablet TAKE 1 TABLET 2 TIMES A DAY (EVERY 12 HR) TO PREVENT BLOOD CLOTS   Flaxseed, Linseed, (FLAX SEED OIL) 1000 MG CAPS Take 1,000 mg by mouth daily.   GUAIFENESIN ER PO Take 400 mg by mouth every morning. Mucinex   Magnesium 400 MG CAPS Take 400 mg by mouth daily.   meloxicam  (MOBIC ) 15 MG tablet TAKE 1/2 TO 1 TABLET DAILY WITH FOOD FOR PAIN & INFLAMMATION   metoprolol  succinate (TOPROL -XL) 25 MG 24 hr tablet Take 1 tablet (25 mg total) by mouth daily.   montelukast (SINGULAIR) 10 MG tablet TAKE 1 TABLET BY MOUTH DAILY FOR ALLERGIES   Multiple Vitamin (MULTIVITAMIN) tablet Take 1 tablet by mouth daily.   Multiple Vitamins-Minerals (ZINC PO) Take 25 mg by mouth daily.   rosuvastatin  (CRESTOR ) 20 MG tablet TAKE 1 TABLET BY MOUTH EVERY DAY   tiZANidine  (ZANAFLEX ) 4 MG tablet TAKE 1 TABLET (4 MG TOTAL) BY MOUTH EVERY 8 (EIGHT) HOURS AS NEEDED FOR MUSCLE SPASMS   vitamin C (ASCORBIC ACID) 500 MG tablet Take 500 mg by mouth daily.   [DISCONTINUED] ALPRAZolam  (XANAX ) 0.5 MG tablet Take 0.25-0.5 mg by mouth at bedtime as needed for anxiety ("limit to 5 days per week").   [DISCONTINUED] fluorouracil (EFUDEX) 5 % cream Apply topically.   [DISCONTINUED] traMADol  (ULTRAM ) 50 MG tablet Take 50-100 mg by mouth daily as needed.   ALPRAZolam  (XANAX ) 0.5 MG tablet Take 0.5-1 tablets (0.25-0.5 mg total) by mouth at bedtime as needed for anxiety ("limit to 5 days per week").   traMADol  (ULTRAM ) 50 MG tablet Take 1-2 tablets (50-100 mg total) by mouth daily as needed.   [DISCONTINUED] NON FORMULARY Take 1,800 mg by mouth daily. Calcium  1800 mg   [DISCONTINUED] traZODone  (DESYREL )  150 MG tablet TAKE 1/2 TO 1 TABLET 1 HOUR BEFORE BEDTIME IF NEEDED FOR SLEEP   No facility-administered encounter medications on file as of 05/30/2023.    Past Medical History:  Diagnosis Date   Anxiety    Depression,  major, in remission (HCC) 11/27/2014   Dyslipidemia    on Rx x20 years   HTN (hypertension)    Hyperlipidemia    Insomnia    Morton's neuroma of left foot    Multiple allergies    Osteoarthritis    Polycythemia    PVC (premature ventricular contraction)    SVT (supraventricular tachycardia) (HCC) 07/12/2020   Presented to office in SVT 07/11/2020. Converted to NSR en route to ED via EMS after NS bolus.   Reported hx of remote SVT episode.       Past Surgical History:  Procedure Laterality Date   ABDOMINAL HYSTERECTOMY     CARDIOVERSION N/A 03/25/2022   Procedure: CARDIOVERSION;  Surgeon: Jacqueline Matsu, MD;  Location: [facility];  Service: Cardiovascular;  Laterality: N/A;   CATARACT EXTRACTION Right 2019   Dr. Juanito Norma    KNEE ARTHROSCOPY     R   TONSILLECTOMY     VESICOVAGINAL FISTULA CLOSURE W/ TAH      Family History  Problem Relation Age of Onset   Osteoarthritis Mother    Heart attack Mother    Heart disease Mother    Heart attack Father    Cancer Father        lung   Osteoarthritis Father    Hyperlipidemia Brother    Diabetes Brother     Social History   Socioeconomic History   Marital status: Widowed    Spouse name: Not on file   Number of children: Not on file   Years of education: Not on file   Highest education level: Not on file  Occupational History   Not on file  Tobacco Use   Smoking status: Never   Smokeless tobacco: Never   Tobacco comments:    no tobacco   Substance and Sexual Activity   Alcohol use: Yes    Comment: rare   Drug use: Not on file   Sexual activity: Not on file  Other Topics Concern   Not on file  Social History Narrative   Married    Social Drivers of Corporate investment banker Strain: Not on file  Food Insecurity: Not on file  Transportation Needs: Not on file  Physical Activity: Not on file  Stress: Not on file  Social Connections: Not on file  Intimate Partner Violence: Not on file    ROS All review of  systems negative except what is listed in the HPI      Objective    BP 128/77   Pulse (!) 104   Ht 5\' 5"  (1.651 m)   Wt 151 lb (68.5 kg)   SpO2 97%   BMI 25.13 kg/m   Physical Exam Vitals reviewed.  Constitutional:      Appearance: Normal appearance.  Cardiovascular:     Rate and Rhythm: Tachycardia present. Rhythm irregular.     Heart sounds: Normal heart sounds.  Pulmonary:     Effort: Pulmonary effort is normal.     Breath sounds: Normal breath sounds.  Musculoskeletal:     Right lower leg: No edema.     Left lower leg: No edema.  Skin:    General: Skin is warm and dry.  Neurological:     Mental Status:  She is alert and oriented to person, place, and time.  Psychiatric:        Mood and Affect: Mood normal.        Behavior: Behavior normal.        Thought Content: Thought content normal.        Judgment: Judgment normal.         Assessment & Plan:   Problem List Items Addressed This Visit       Active Problems   Hyperlipidemia, mixed (Chronic)   Medication management: rosuvastatin  20 mg daily  Lifestyle factors for lowering cholesterol include: Diet therapy - heart-healthy diet rich in fruits, veggies, fiber-rich whole grains, lean meats, chicken, fish (at least twice a week), fat-free or 1% dairy products; foods low in saturated/trans fats, cholesterol, sodium, and sugar. Mediterranean diet has shown to be very heart healthy. Regular exercise - recommend at least 30 minutes a day, 5 times per week Weight management  Repeat CMP and lipid panel today Following with cardiology        Relevant Orders   Comprehensive metabolic panel with GFR   Lipid panel   Essential hypertension (Chronic)   Blood pressure is at goal for age and co-morbidities.   Recommendations: Cardizem  CD 240 mg daily, metoprolol  succinate 25 mg daily  - BP goal <130/80 - monitor and log blood pressures at home - check around the same time each day in a relaxed setting - Limit  salt to <2000 mg/day - Follow DASH eating plan (heart healthy diet) - limit alcohol to 2 standard drinks per day for men and 1 per day for women - avoid tobacco products - get at least 2 hours of regular aerobic exercise weekly Patient aware of signs/symptoms requiring further/urgent evaluation. Labs updated today. Following with cardiology       Relevant Orders   CBC with Differential/Platelet   Comprehensive metabolic panel with GFR   Lipid panel   Anxiety (Chronic)   No SI/HI Stable on Celexa  20 mg and PRN Xanax  Contract/UDS today  PDMP reviewed - refill provided       Relevant Medications   ALPRAZolam  (XANAX ) 0.5 MG tablet   Vitamin D  deficiency (Chronic)   Supplement and monitor       Relevant Orders   VITAMIN D  25 Hydroxy (Vit-D Deficiency, Fractures)   CKD (chronic kidney disease) stage 2, GFR 60-89 ml/min (Chronic)   Previously stable. Labs today       Relevant Orders   Comprehensive metabolic panel with GFR   Persistent atrial fibrillation (HCC) - Primary (Chronic)   Heart rate 104 bpm, controlled with current medication. No palpitations, chest pain, or shortness of breath. - Continue diltiazem  and metoprolol  for rate control. - Continue Eliquis  for anticoagulation. - Follow up with cardiologist as scheduled. - Patient aware of signs/symptoms requiring further/urgent evaluation.       Relevant Orders   Comprehensive metabolic panel with GFR   TSH   High risk medication use   Indication for controlled substance: anxiety, chronic back pain Medication and dose: Xanax  0.25-0.5 mg nightly PRN; tramadol  50-100 mg daily PRN # pills per month: 30 each Last UDS date: 05/30/23 Controlled Substance Treatment Agreement signed (Y/N): yes 05/30/23 Controlled Substance Treatment Agreement last reviewed with patient:   NCCSRS/PDMP reviewed this encounter: Yes - Intermittent usage (less than daily use) requires appointment every 6 months       Relevant Medications    ALPRAZolam  (XANAX ) 0.5 MG tablet   traMADol  (ULTRAM ) 50 MG  tablet   Other Relevant Orders   Drug Monitoring Panel [id number] , Urine   Chronic midline low back pain   At baseline/stable. Continue PRN Zanaflex  and Tramadol ; Meloxicam  - not recommended since on anticoagulation. Home exercises/active lifestyle encouraged       Relevant Medications   traMADol  (ULTRAM ) 50 MG tablet   Other Visit Diagnoses       Encounter for medical examination to establish care         Hyperglycemia       Relevant Orders   Comprehensive metabolic panel with GFR   Hemoglobin A1c       Return in about 6 months (around 11/29/2023) for routine follow-up; schedule AWV.   Everlina Hock, NP

## 2023-05-30 NOTE — Addendum Note (Signed)
 Addended by: Marigene Shoulder on: 05/30/2023 03:19 PM   Modules accepted: Orders

## 2023-05-30 NOTE — Assessment & Plan Note (Signed)
 Supplement and monitor

## 2023-05-30 NOTE — Assessment & Plan Note (Signed)
Previously stable. Labs today.

## 2023-05-30 NOTE — Assessment & Plan Note (Signed)
 Indication for controlled substance: anxiety, chronic back pain Medication and dose: Xanax  0.25-0.5 mg nightly PRN; tramadol  50-100 mg daily PRN # pills per month: 30 each Last UDS date: 05/30/23 Controlled Substance Treatment Agreement signed (Y/N): yes 05/30/23 Controlled Substance Treatment Agreement last reviewed with patient:   NCCSRS/PDMP reviewed this encounter: Yes - Intermittent usage (less than daily use) requires appointment every 6 months

## 2023-05-30 NOTE — Assessment & Plan Note (Signed)
 Heart rate 104 bpm, controlled with current medication. No palpitations, chest pain, or shortness of breath. - Continue diltiazem  and metoprolol  for rate control. - Continue Eliquis  for anticoagulation. - Follow up with cardiologist as scheduled. - Patient aware of signs/symptoms requiring further/urgent evaluation.

## 2023-05-30 NOTE — Patient Instructions (Signed)
 Thank you for choosing [facility] at [facility] for your Primary Care needs. I am excited for the opportunity to partner with you to meet your health care goals. It was a pleasure meeting you today!  Information on diet, exercise, and health maintenance recommendations are listed below. This is information to help you be sure you are on track for optimal health and monitoring.   Please look over this and let us know if you have any questions or if you have completed any of the health maintenance outside of [facility] so that we can be sure your records are up to date.  ___________________________________________________________  MyChart:  For all urgent or time sensitive needs we ask that you please call the office to avoid delays. Our number is [phone]. MyChart is not constantly monitored and due to the large volume of messages a day, replies may take up to 72 business hours.  MyChart Policy: MyChart allows for you to see your visit notes, after visit summary, provider recommendations, lab and tests results, make an appointment, request refills, and contact your provider or the office for non-urgent questions or concerns. Providers are seeing patients during normal business hours and do not have built in time to review MyChart messages.  We ask that you allow a minimum of 3 business days for responses to [employer]. For this reason, please do not send urgent requests through MyChart. Please call the office at [phone]. New and ongoing conditions may require a visit. We have virtual and in-person visits available for your convenience.  Complex MyChart concerns may require a visit. Your provider may request you schedule a virtual or in-person visit to ensure we are providing the best care possible. MyChart messages sent after 11:00 AM on Friday may not be received by the provider until Monday morning.    Lab and Test Results: You will receive your lab and test  results on MyChart as soon as they are completed and results have been sent by the lab or testing facility. Due to this service, you will receive your results BEFORE your provider.  I review lab and test results each morning prior to seeing patients. Some results require collaboration with other providers to ensure you are receiving the most appropriate care. For this reason, we ask that you please allow a minimum of 3-5 business days from the time that ALL results have been received for your provider to receive and review lab and test results and contact you about these.  Most lab and test result comments from the provider will be sent through MyChart. Your provider may recommend changes to the plan of care, follow-up visits, repeat testing, ask questions, or request an office visit to discuss these results. You may reply directly to this message or call the office to provide information for the provider or set up an appointment. In some instances, you will be called with test results and recommendations. Please let us know if this is preferred and we will make note of this in your chart to provide this for you.    If you have not heard a response to your lab or test results in 5 business days from all results returning to MyChart, please call the office to let us know. We ask that you please avoid calling prior to this time unless there is an emergent concern. Due to high call volumes, this can delay the resulting process.  After Hours: For all non-emergency after hours needs, please  call the office at [phone] and select the option to reach the on-call  service. On-call services are shared between multiple [facility] offices and therefore it will not be possible to speak directly with your provider. On-call providers may provide medical advice and recommendations, but are unable to provide refills for maintenance medications.  For all emergency or urgent medical needs after normal business hours, we  recommend that you seek care at the closest Urgent Care or Emergency Department to ensure appropriate treatment in a timely manner.  [facility] has a 24 hour emergency room located on the ground floor for your convenience.   Urgent Concerns During the Business Day Providers are seeing patients from 8AM to 5PM with a busy schedule and are most often not able to respond to non-urgent calls until the end of the day or the next business day. If you should have URGENT concerns during the day, please call and speak to the nurse or schedule a same day appointment so that we can address your concern without delay.   Thank you, again, for choosing me as your health care partner. I appreciate your trust and look forward to learning more about you!   Kelsey Marrow Reola Calkins, DNP, FNP-C  ___________________________________________________________  Health Maintenance Recommendations Screening Testing Mammogram Every 1-2 years based on history and risk factors Starting at age 60 Pap Smear Ages 21-39 every 3 years Ages 22-65 every 5 years with HPV testing More frequent testing may be required based on results and history Colon Cancer Screening Every 1-10 years based on test performed, risk factors, and history Starting at age 29 Bone Density Screening Every 2-10 years based on history Starting at age 79 for women Recommendations for men differ based on medication usage, history, and risk factors AAA Screening One time ultrasound Men 59-28 years old who have ever smoked Lung Cancer Screening Low Dose Lung CT every 12 months Age 51-80 years with a 20 pack-year smoking history who still smoke or who have quit within the last 15 years  Screening Labs Routine  Labs: Complete Blood Count (CBC), Complete Metabolic Panel (CMP), Cholesterol (Lipid Panel) Every 6-12 months based on history and medications May be recommended more frequently based on current conditions or previous results Hemoglobin  A1c Lab Every 3-12 months based on history and previous results Starting at age 79 or earlier with diagnosis of diabetes, high cholesterol, BMI >26, and/or risk factors Frequent monitoring for patients with diabetes to ensure blood sugar control Thyroid Panel  Every 6 months based on history, symptoms, and risk factors May be repeated more often if on medication HIV One time testing for all patients 66 and older May be repeated more frequently for patients with increased risk factors or exposure Hepatitis C One time testing for all patients 59 and older May be repeated more frequently for patients with increased risk factors or exposure Gonorrhea, Chlamydia Every 12 months for all sexually active persons 13-24 years Additional monitoring may be recommended for those who are considered high risk or who have symptoms PSA Men 69-17 years old with risk factors Additional screening may be recommended from age 43-69 based on risk factors, symptoms, and history  Vaccine Recommendations Tetanus Booster All adults every 10 years Flu Vaccine All patients 6 months and older every year COVID Vaccine All patients 12 years and older Initial dosing with booster May recommend additional booster based on age and health history HPV Vaccine 2 doses all patients age 71-26 Dosing may be considered  for patients over 26 Shingles Vaccine (Shingrix) 2 doses all adults 50 years and older Pneumonia (Pneumovax 23) All adults 65 years and older May recommend earlier dosing based on health history Pneumonia (Prevnar 53) All adults 65 years and older Dosed 1 year after Pneumovax 23 Pneumonia (Prevnar 20) All adults 65 years and older (adults 19-64 with certain conditions or risk factors) 1 dose  For those who have not received Prevnar 13 vaccine previously   Additional Screening, Testing, and Vaccinations may be recommended on an individualized basis based on family history, health history, risk  factors, and/or exposure.  __________________________________________________________  Diet Recommendations for All Patients  I recommend that all patients maintain a diet low in saturated fats, carbohydrates, and cholesterol. While this can be challenging at first, it is not impossible and small changes can make big differences.  Things to try: Decreasing the amount of soda, sweet tea, and/or juice to one or less per day and replace with water While water is always the first choice, if you do not like water you may consider adding a water additive without sugar to improve the taste other sugar free drinks Replace potatoes with a brightly colored vegetable  Use healthy oils, such as canola oil or olive oil, instead of butter or hard margarine Limit your bread intake to two pieces or less a day Replace regular pasta with low carb pasta options Bake, broil, or grill foods instead of frying Monitor portion sizes  Eat smaller, more frequent meals throughout the day instead of large meals  An important thing to remember is, if you love foods that are not great for your health, you don't have to give them up completely. Instead, allow these foods to be a reward when you have done well. Allowing yourself to still have special treats every once in a while is a nice way to tell yourself thank you for working hard to keep yourself healthy.   Also remember that every day is a new day. If you have a bad day and "fall off the wagon", you can still climb right back up and keep moving along on your journey!  We have resources available to help you!  Some websites that may be helpful include: www.http://www.wall-moore.info/  Www.VeryWellFit.com _____________________________________________________________  Activity Recommendations for All Patients  I recommend that all adults get at least 30 minutes of moderate physical activity that elevates your heart rate at least 5 days out of the week.  Some examples  include: Walking or jogging at a pace that allows you to carry on a conversation Cycling (stationary bike or outdoors) Water aerobics Yoga Weight lifting Dancing If physical limitations prevent you from putting stress on your joints, exercise in a pool or seated in a chair are excellent options.  Do determine your MAXIMUM heart rate for activity: 220 - YOUR AGE = MAX Heart Rate   Remember! Do not push yourself too hard.  Start slowly and build up your pace, speed, weight, time in exercise, etc.  Allow your body to rest between exercise and get good sleep. You will need more water than normal when you are exerting yourself. Do not wait until you are thirsty to drink. Drink with a purpose of getting in at least 8, 8 ounce glasses of water a day plus more depending on how much you exercise and sweat.    If you begin to develop dizziness, chest pain, abdominal pain, jaw pain, shortness of breath, headache, vision changes, lightheadedness, or other concerning symptoms,  stop the activity and allow your body to rest. If your symptoms are severe, seek emergency evaluation immediately. If your symptoms are concerning, but not severe, please let us know so that we can recommend further evaluation.

## 2023-05-30 NOTE — Assessment & Plan Note (Signed)
 Medication management: rosuvastatin  20 mg daily  Lifestyle factors for lowering cholesterol include: Diet therapy - heart-healthy diet rich in fruits, veggies, fiber-rich whole grains, lean meats, chicken, fish (at least twice a week), fat-free or 1% dairy products; foods low in saturated/trans fats, cholesterol, sodium, and sugar. Mediterranean diet has shown to be very heart healthy. Regular exercise - recommend at least 30 minutes a day, 5 times per week Weight management  Repeat CMP and lipid panel today Following with cardiology

## 2023-05-31 ENCOUNTER — Encounter: Payer: Self-pay | Admitting: Family Medicine

## 2023-05-31 LAB — CBC WITH DIFFERENTIAL/PLATELET
Basophils Absolute: 0.1 10*3/uL (ref 0.0–0.1)
Basophils Relative: 0.7 % (ref 0.0–3.0)
Eosinophils Absolute: 0.2 10*3/uL (ref 0.0–0.7)
Eosinophils Relative: 1.8 % (ref 0.0–5.0)
HCT: 43.2 % (ref 36.0–46.0)
Hemoglobin: 14.4 g/dL (ref 12.0–15.0)
Lymphocytes Relative: 12.6 % (ref 12.0–46.0)
Lymphs Abs: 1.1 10*3/uL (ref 0.7–4.0)
MCHC: 33.3 g/dL (ref 30.0–36.0)
MCV: 95.4 fl (ref 78.0–100.0)
Monocytes Absolute: 0.5 10*3/uL (ref 0.1–1.0)
Monocytes Relative: 6.1 % (ref 3.0–12.0)
Neutro Abs: 7.1 10*3/uL (ref 1.4–7.7)
Neutrophils Relative %: 78.8 % — ABNORMAL HIGH (ref 43.0–77.0)
Platelets: 228 10*3/uL (ref 150.0–400.0)
RBC: 4.53 Mil/uL (ref 3.87–5.11)
RDW: 13.7 % (ref 11.5–15.5)
WBC: 9 10*3/uL (ref 4.0–10.5)

## 2023-05-31 LAB — COMPREHENSIVE METABOLIC PANEL WITH GFR
ALT: 20 U/L (ref 0–35)
AST: 21 U/L (ref 0–37)
Albumin: 4 g/dL (ref 3.5–5.2)
Alkaline Phosphatase: 80 U/L (ref 39–117)
BUN: 12 mg/dL (ref 6–23)
CO2: 28 meq/L (ref 19–32)
Calcium: 9.5 mg/dL (ref 8.4–10.5)
Chloride: 104 meq/L (ref 96–112)
Creatinine, Ser: 0.7 mg/dL (ref 0.40–1.20)
GFR: 80.45 mL/min (ref >60.00)
Glucose, Bld: 109 mg/dL — ABNORMAL HIGH (ref 70–99)
Potassium: 3.5 meq/L (ref 3.5–5.1)
Sodium: 141 meq/L (ref 135–145)
Total Bilirubin: 0.8 mg/dL (ref 0.2–1.2)
Total Protein: 6.1 g/dL (ref 6.0–8.3)

## 2023-05-31 LAB — HEMOGLOBIN A1C: Hgb A1c MFr Bld: 5.6 % (ref 4.6–6.5)

## 2023-05-31 LAB — LIPID PANEL
Cholesterol: 145 mg/dL (ref 0–200)
HDL: 62.7 mg/dL (ref >39.00)
LDL Cholesterol: 65 mg/dL (ref 0–99)
NonHDL: 82.09
Total CHOL/HDL Ratio: 2
Triglycerides: 85 mg/dL (ref 0.0–149.0)
VLDL: 17 mg/dL (ref 0.0–40.0)

## 2023-05-31 LAB — VITAMIN D 25 HYDROXY (VIT D DEFICIENCY, FRACTURES): VITD: 96.54 ng/mL (ref 30.00–100.00)

## 2023-05-31 LAB — TSH: TSH: 1.17 u[IU]/mL (ref 0.35–5.50)

## 2023-06-06 ENCOUNTER — Other Ambulatory Visit

## 2023-06-06 DIAGNOSIS — Z79899 Other long term (current) drug therapy: Secondary | ICD-10-CM

## 2023-06-09 LAB — DRUG MONITORING PANEL 376104, URINE

## 2023-06-09 LAB — DM TEMPLATE

## 2023-06-15 ENCOUNTER — Other Ambulatory Visit: Payer: Self-pay | Admitting: Family Medicine

## 2023-06-15 DIAGNOSIS — I4819 Other persistent atrial fibrillation: Secondary | ICD-10-CM

## 2023-06-15 DIAGNOSIS — M159 Polyosteoarthritis, unspecified: Secondary | ICD-10-CM

## 2023-06-15 DIAGNOSIS — F419 Anxiety disorder, unspecified: Secondary | ICD-10-CM

## 2023-06-15 DIAGNOSIS — I4719 Other supraventricular tachycardia: Secondary | ICD-10-CM

## 2023-06-15 DIAGNOSIS — G8929 Other chronic pain: Secondary | ICD-10-CM

## 2023-06-15 DIAGNOSIS — Z79899 Other long term (current) drug therapy: Secondary | ICD-10-CM

## 2023-06-15 NOTE — Telephone Encounter (Unsigned)
 Copied from CRM [id number]. Topic: Clinical - Medication Refill >> Jun 15, 2023 12:15 PM Tanazia G wrote: Medication: diltiazem  (CARDIZEM  CD) 240 MG 24 hr capsule citalopram  (CELEXA ) 20 MG tablet ALPRAZolam  (XANAX ) 0.5 MG tablet ELIQUIS  5 MG TABS tablet meloxicam  (MOBIC ) 15 MG tablet metoprolol  succinate (TOPROL -XL) 25 MG 24 hr tablet rosuvastatin  (CRESTOR ) 20 MG traMADol  (ULTRAM ) 50 MG tablet tiZANidine  (ZANAFLEX ) 4 MG tablet   Has the patient contacted their pharmacy? Yes (Agent: If no, request that the patient contact the pharmacy for the refill. If patient does not wish to contact the pharmacy document the reason why and proceed with request.) (Agent: If yes, when and what did the pharmacy advise?)  This is the patient's preferred pharmacy:  [id number] [place], [place] - [address] [place] [address] Phone: [phone] Fax: [phone]  Is this the correct pharmacy for this prescription? Yes If no, delete pharmacy and type the correct one.   Has the prescription been filled recently? Yes  Is the patient out of the medication? Yes  Has the patient been seen for an appointment in the last year OR does the patient have an upcoming appointment? Yes  Can we respond through MyChart? Yes  Agent: Please be advised that Rx refills may take up to 3 business days. We ask that you follow-up with your pharmacy.

## 2023-06-15 NOTE — Telephone Encounter (Signed)
 Last Fill: Diltiazem : 09/16/22     Citalopram : 05/09/22        Alprazolam : 05/30/23 30 tabs/0 RF     Eliquis : 0/05/25     Meloxicam : 05/09/22     Metoprolol : 03/21/23     Rosuvastatin : 03/21/23     Tramadol : 05/30/23 30 tabs/0 RF     Tizanidine : 03/30/21  Last OV: 05/30/23 Next OV: None Scheduled  Routing to provider for review/authorization.

## 2023-06-16 MED ORDER — MELOXICAM 15 MG PO TABS: 15.0000 mg | ORAL_TABLET | Freq: Every day | ORAL | 3 refills | Status: AC | PRN

## 2023-06-16 MED ORDER — APIXABAN 5 MG PO TABS: 5.0000 mg | ORAL_TABLET | Freq: Two times a day (BID) | ORAL | 5 refills | Status: DC

## 2023-06-16 MED ORDER — CITALOPRAM HYDROBROMIDE 20 MG PO TABS: ORAL_TABLET | ORAL | 3 refills | Status: AC

## 2023-06-16 MED ORDER — DILTIAZEM HCL ER COATED BEADS 240 MG PO CP24: ORAL_CAPSULE | ORAL | 3 refills | Status: AC

## 2023-06-16 MED ORDER — TIZANIDINE HCL 4 MG PO TABS: 4.0000 mg | ORAL_TABLET | Freq: Three times a day (TID) | ORAL | 6 refills | Status: AC | PRN

## 2023-06-16 MED ORDER — ROSUVASTATIN CALCIUM 20 MG PO TABS: 20.0000 mg | ORAL_TABLET | Freq: Every day | ORAL | 3 refills | Status: AC

## 2023-06-16 MED ORDER — METOPROLOL SUCCINATE ER 25 MG PO TB24: 25.0000 mg | ORAL_TABLET | Freq: Every day | ORAL | 3 refills | Status: AC

## 2023-08-11 ENCOUNTER — Telehealth: Payer: Self-pay | Admitting: *Deleted

## 2023-08-11 ENCOUNTER — Ambulatory Visit: Admitting: *Deleted

## 2023-08-11 VITALS — BP 147/73 | HR 105 | Temp 98.2°F | Resp 16 | Ht 65.0 in | Wt 147.8 lb

## 2023-08-11 DIAGNOSIS — Z0000 Encounter for general adult medical examination without abnormal findings: Secondary | ICD-10-CM | POA: Diagnosis not present

## 2023-08-11 NOTE — Telephone Encounter (Signed)
 Pt had AWV today.  Her BP readings were slightly elevated at 158/77, 147/73. I scheduled 45mf/u for 10/21 at 10:40am.  Also, Trazodone  was showing up from outside source and pt confirmed she has been taking this for years so I added it to the med list and it gave me the following contraindications.  Please advise. High Drug-Drug: traMADol  and traZODoneSerotonergic effects of this combination may be additive. The risk for serotonin syndrome/toxicity may be increased. Additionally, additive CNS depression may occur. Details Override reason     traZODone  (DESYREL ) 150 MG tablet Patient reported medication. New. Remove traMADol  (ULTRAM ) 50 MG tablet Prescription. Active. Discontinue  High Drug-Drug: citalopram  and traZODoneAdditive serotonergic effects may occur during coadministration of selective serotonin reuptake inhibitors (SSRIs) and Serotonergic Non-Opioid CNS Depressants, and the risk of developing serotonin syndrome may be increased. Details Override reason     traZODone  (DESYREL ) 150 MG tablet Patient reported medication. New. Remove citalopram  (CELEXA ) 20 MG tablet Prescription. Active. Long-term

## 2023-08-11 NOTE — Patient Instructions (Addendum)
 Ms. Cortopassi , Thank you for taking time out of your busy schedule to complete your Annual Wellness Visit with me. I enjoyed our conversation and look forward to speaking with you again next year. I, as well as your care team,  appreciate your ongoing commitment to your health goals. Please review the following plan we discussed and let me know if I can assist you in the future. Your Game plan/ To Do List     Follow up Visits: Next Medicare AWV with our clinical staff: 08/14/24 1:40pm, telephone    Next Office Visit with your provider: 11/29/23 10:40am  Clinician Recommendations:  Aim for 30 minutes of exercise or brisk walking, 6-8 glasses of water, and 5 servings of fruits and vegetables each day.   You will need to get the following vaccines at your local pharmacy:   Tetanus booster.    This is a list of the screening recommended for you and due dates:  Health Maintenance  Topic Date Due   Medicare Annual Wellness Visit  05/17/2023   DTaP/Tdap/Td vaccine (2 - Td or Tdap) 08/02/2023   COVID-19 Vaccine (8 - 2024-25 season) 05/25/2024*   Mammogram  05/29/2024*   DEXA scan (bone density measurement)  05/29/2024*   Flu Shot  09/09/2023   Pneumococcal Vaccine for age over 31  Completed   Zoster (Shingles) Vaccine  Completed   Hepatitis B Vaccine  Aged Out   HPV Vaccine  Aged Out   Meningitis B Vaccine  Aged Out  *Topic was postponed. The date shown is not the original due date.    Advanced directives: (Copy Requested) Please bring a copy of your health care power of attorney and living will to the office to be added to your chart at your convenience. You can mail to [facility] [address] [place], [place] [address] or email to [email or web address] Advance Care Planning is important because it:  [x]  Makes sure you receive the medical care that is consistent with your values, goals, and preferences  [x]  It provides guidance to your family and loved ones and  reduces their decisional burden about whether or not they are making the right decisions based on your wishes.  Follow the link provided in your after visit summary or read over the paperwork we have mailed to you to help you started getting your Advance Directives in place. If you need assistance in completing these, please reach out to us  so that we can help you!  See attachments for Preventive Care and Fall Prevention Tips.

## 2023-08-11 NOTE — Telephone Encounter (Signed)
 Thank you, just ask her to monitor BP at home for the next few weeks and if still >140/90 she can come in sooner so we can adjust meds.   Should be low risk for serotonin syndrome, and sounds like she has been doing fine with this regimen so far. Just make sure she is minimizing tramadol  use. If any new symptoms develop follow-up.

## 2023-08-11 NOTE — Progress Notes (Signed)
 Subjective:   Kelsey Lopez is a 83 y.o. who presents for a Medicare Wellness preventive visit.  As a reminder, Annual Wellness Visits don't include a physical exam, and some assessments may be limited, especially if this visit is performed virtually. We may recommend an in-person follow-up visit with your provider if needed.  Visit Complete: In person  Persons Participating in Visit: Patient.  AWV Questionnaire: No: Patient Medicare AWV questionnaire was not completed prior to this visit.  Cardiac Risk Factors include: advanced age (>31men, >68 women);dyslipidemia;hypertension;Other (see comment), Risk factor comments: Afib, CKD     Objective:    Today's Vitals   08/11/23 0938  BP: (!) 158/77  Pulse: (!) 105  Resp: 16  Temp: 98.2 F (36.8 C)  TempSrc: Oral  Weight: 147 lb 12.8 oz (67 kg)  Height: 5' 5 (1.651 m)   Body mass index is 24.6 kg/m.     08/11/2023   10:07 AM 05/17/2022    2:53 PM 03/25/2022    9:08 AM 05/12/2021    2:44 PM 01/27/2021    1:09 PM 04/16/2020    2:58 PM 02/26/2019    3:26 PM  Advanced Directives  Does Patient Have a Medical Advance Directive? Yes Yes No Yes Yes Yes Yes  Type of Estate agent of [place];Living will Living will  Healthcare Power of [place];Living will Healthcare Power of [place];Living will Healthcare Power of [place];Living will Healthcare Power of [place];Living will  Does patient want to make changes to medical advance directive? No - Patient declined   No - Patient declined No - Patient declined No - Patient declined No - Patient declined  Copy of Healthcare Power of Attorney in Chart? No - copy requested   No - copy requested  No - copy requested     Current Medications (verified) Outpatient Encounter Medications as of 08/11/2023  Medication Sig   ALPRAZolam  (XANAX ) 0.5 MG tablet Take 0.5-1 tablets (0.25-0.5 mg total) by mouth at bedtime as needed for anxiety (limit to 5 days per week).   apixaban   (ELIQUIS ) 5 MG TABS tablet Take 1 tablet (5 mg total) by mouth 2 (two) times daily.   CALCIUM  PO Take 1,800 mg by mouth daily.   cetirizine (ZYRTEC) 10 MG tablet Take 10 mg by mouth daily.   citalopram  (CELEXA ) 20 MG tablet TAKE 1 TABLET  EVERY DAY FOR MOOD   diltiazem  (CARDIZEM  CD) 240 MG 24 hr capsule Take  1 tablet  Daily for Heart Rhythm                                                                     /                                                                   TAKE  BY                                                 MOUTH   Flaxseed, Linseed, (FLAX SEED OIL) 1000 MG CAPS Take 1,000 mg by mouth daily.   GUAIFENESIN ER PO Take 400 mg by mouth every morning. Mucinex   Magnesium 400 MG CAPS Take 400 mg by mouth daily.   meloxicam  (MOBIC ) 15 MG tablet Take 1 tablet (15 mg total) by mouth daily as needed for pain.   metoprolol  succinate (TOPROL -XL) 25 MG 24 hr tablet Take 1 tablet (25 mg total) by mouth daily.   montelukast (SINGULAIR) 10 MG tablet TAKE 1 TABLET BY MOUTH DAILY FOR ALLERGIES   Multiple Vitamin (MULTIVITAMIN) tablet Take 1 tablet by mouth daily.   Multiple Vitamins-Minerals (ZINC PO) Take 25 mg by mouth daily.   rosuvastatin  (CRESTOR ) 20 MG tablet Take 1 tablet (20 mg total) by mouth daily.   tiZANidine  (ZANAFLEX ) 4 MG tablet Take 1 tablet (4 mg total) by mouth every 8 (eight) hours as needed for muscle spasms.   traMADol  (ULTRAM ) 50 MG tablet Take 1-2 tablets (50-100 mg total) by mouth daily as needed.   traZODone  (DESYREL ) 150 MG tablet Take 75-150 mg by mouth at bedtime as needed.   vitamin C (ASCORBIC ACID) 500 MG tablet Take 500 mg by mouth daily.   No facility-administered encounter medications on file as of 08/11/2023.    Allergies (verified) Ace inhibitors, Ampicillin, Mevacor [lovastatin], Penicillins, and Sulfa antibiotics   History: Past Medical History:  Diagnosis Date   Anxiety    Depression, major, in remission  (HCC) 11/27/2014   Dyslipidemia    on Rx x20 years   HTN (hypertension)    Hyperlipidemia    Insomnia    Morton's neuroma of left foot    Multiple allergies    Osteoarthritis    Polycythemia    PVC (premature ventricular contraction)    SVT (supraventricular tachycardia) (HCC) 07/12/2020   Presented to office in SVT 07/11/2020. Converted to NSR en route to ED via EMS after NS bolus.   Reported hx of remote SVT episode.      Past Surgical History:  Procedure Laterality Date   ABDOMINAL HYSTERECTOMY     CARDIOVERSION N/A 03/25/2022   Procedure: CARDIOVERSION;  Surgeon: Shlomo Wilbert SAUNDERS, MD;  Location: [facility];  Service: Cardiovascular;  Laterality: N/A;   CATARACT EXTRACTION Right 2019   Dr. Leslee    KNEE ARTHROSCOPY     R   TONSILLECTOMY     VESICOVAGINAL FISTULA CLOSURE W/ TAH     Family History  Problem Relation Age of Onset   Osteoarthritis Mother    Heart attack Mother    Heart disease Mother    Heart attack Father    Cancer Father        lung   Osteoarthritis Father    Hyperlipidemia Brother    Diabetes Brother    Social History   Socioeconomic History   Marital status: Widowed    Spouse name: Not on file   Number of children: Not on file   Years of education: Not on file   Highest education level: Not on file  Occupational History   Not on file  Tobacco Use   Smoking status: Never   Smokeless tobacco: Never   Tobacco comments:  no tobacco   Substance and Sexual Activity   Alcohol use: Yes    Comment: rare   Drug use: Not Currently   Sexual activity: Not on file  Other Topics Concern   Not on file  Social History Narrative   Married    Social Drivers of Health   Financial Resource Strain: Low Risk  (08/11/2023)   Overall Financial Resource Strain (CARDIA)    Difficulty of Paying Living Expenses: Not hard at all  Food Insecurity: No Food Insecurity (08/11/2023)   Hunger Vital Sign    Worried About Running Out of Food in the Last Year: Never  true    Ran Out of Food in the Last Year: Never true  Transportation Needs: No Transportation Needs (08/11/2023)   PRAPARE - Administrator, Civil Service (Medical): No    Lack of Transportation (Non-Medical): No  Physical Activity: Inactive (08/11/2023)   Exercise Vital Sign    Days of Exercise per Week: 0 days    Minutes of Exercise per Session: 0 min  Stress: No Stress Concern Present (08/11/2023)   [employer] of Occupational Health - Occupational Stress Questionnaire    Feeling of Stress: Not at all  Social Connections: Moderately Isolated (08/11/2023)   Social Connection and Isolation Panel    Frequency of Communication with Friends and Family: More than three times a week    Frequency of Social Gatherings with Friends and Family: Once a week    Attends Religious Services: More than 4 times per year    Active Member of [employer] or Organizations: No    Attends Banker Meetings: Never    Marital Status: Widowed    Tobacco Counseling Counseling given: Not Answered Tobacco comments: no tobacco     Clinical Intake:  Pre-visit preparation completed: Yes  Pain : No/denies pain     BMI - recorded: 24.6 Nutritional Status: BMI of 19-24  Normal Nutritional Risks: None Diabetes: No  Lab Results  Component Value Date   HGBA1C 5.6 05/30/2023   HGBA1C 5.4 11/17/2022   HGBA1C 5.6 08/16/2022     How often do you need to have someone help you when you read instructions, pamphlets, or other written materials from your doctor or pharmacy?: 1 - Never What is the last grade level you completed in school?: 12th  Interpreter Needed?: No  Information entered by :: Lolita Libra, CMA   Activities of Daily Living     08/11/2023    9:57 AM 02/20/2023   10:20 PM  In your present state of health, do you have any difficulty performing the following activities:  Hearing? 0 0  Vision? 0 0  Difficulty concentrating or making decisions? 1 0  Comment doesn't  remember things like she used to.   Walking or climbing stairs? 1 0  Comment has bilateral OA   Dressing or bathing? 0 0  Doing errands, shopping? 0 0  Preparing Food and eating ? N   Using the Toilet? N   In the past six months, have you accidently leaked urine? N   Do you have problems with loss of bowel control? N   Managing your Medications? N   Managing your Finances? N   Housekeeping or managing your Housekeeping? N     Patient Care Team: Almarie Waddell NOVAK, NP as PCP - General (Family Medicine) Ivin Kocher, MD as Consulting Physician (Dermatology) Rosalie Kitchens, MD as Consulting Physician (Gastroenterology) Okey Vina GAILS, MD as Consulting Physician (Cardiology) Jerri,  Kay HERO, MD as Attending Physician (Orthopedic Surgery) Leslee Reusing, MD as Consulting Physician (Ophthalmology)  I have updated your Care Teams any recent Medical Services you may have received from other providers in the past year.     Assessment:   This is a routine wellness examination for Lailani.  Hearing/Vision screen Hearing Screening - Comments:: Denies hearing difficulties.  Vision Screening - Comments:: Wears RX glasses -- Has eye exam in September with Dr Mccuen.   Goals Addressed   None    Depression Screen     08/11/2023   10:04 AM 05/30/2023    2:22 PM 02/20/2023   10:19 PM 08/16/2022    3:18 PM 05/17/2022    2:58 PM 02/02/2022   12:15 AM 05/12/2021    2:48 PM  PHQ 2/9 Scores  PHQ - 2 Score 0 0 2 0 0 0 0  PHQ- 9 Score 1 1 2         Fall Risk     08/11/2023    9:54 AM 05/30/2023    2:22 PM 02/20/2023   10:19 PM 08/16/2022    3:18 PM 05/17/2022    2:58 PM  Fall Risk   Falls in the past year? 0 0 0 0 0  Number falls in past yr: 0 0     Injury with Fall? 0 0     Risk for fall due to : Impaired mobility No Fall Risks No Fall Risks No Fall Risks   Follow up Falls evaluation completed Falls evaluation completed Falls prevention discussed;Education provided;Falls evaluation completed Falls  prevention discussed;Education provided;Falls evaluation completed     MEDICARE RISK AT HOME:  Medicare Risk at Home Any stairs in or around the home?: No If so, are there any without handrails?: No Home free of loose throw rugs in walkways, pet beds, electrical cords, etc?: Yes Adequate lighting in your home to reduce risk of falls?: Yes Life alert?: No Use of a cane, walker or w/c?: No Grab bars in the bathroom?: Yes Shower chair or bench in shower?: Yes Elevated toilet seat or a handicapped toilet?: Yes  TIMED UP AND GO:  Was the test performed?  Yes  Length of time to ambulate 10 feet: 5 sec Gait slow and steady without use of assistive device  Cognitive Function: 6CIT completed        Immunizations Immunization History  Administered Date(s) Administered   Fluad Quad(high Dose 65+) 11/10/2021   Influenza Split 11/15/2011, 11/28/2012, 10/31/2013, 01/07/2014   Influenza, High Dose Seasonal PF 11/27/2014, 11/28/2015, 09/29/2016, 10/30/2017, 10/01/2018, 10/23/2019, 10/14/2020, 11/25/2021, 11/17/2022   Influenza-Unspecified 09/29/2016, 10/01/2018   Moderna Covid-19 Fall Seasonal Vaccine 55yrs & older 11/10/2021   PFIZER Comirnaty(Gray Top)Covid-19 Tri-Sucrose Vaccine 05/12/2020   PFIZER(Purple Top)SARS-COV-2 Vaccination 03/02/2019, 03/23/2019, 11/20/2019   Pfizer Covid-19 Vaccine Bivalent Booster 47yrs & up 12/01/2020, 06/11/2021, 12/01/2021   Pneumococcal Conjugate-13 10/02/2013   Pneumococcal Polysaccharide-23 08/03/2017   Respiratory Syncytial Virus Vaccine,Recomb Aduvanted(Arexvy) 12/01/2021   Tdap 08/01/2013   Zoster Recombinant(Shingrix) 11/10/2016, 01/10/2017, 02/07/2017   Zoster, Live 05/25/2011    Screening Tests Health Maintenance  Topic Date Due   Medicare Annual Wellness (AWV)  05/17/2023   DTaP/Tdap/Td (2 - Td or Tdap) 08/02/2023   COVID-19 Vaccine (8 - 2024-25 season) 05/25/2024 (Originally 10/10/2022)   MAMMOGRAM  05/29/2024 (Originally 06/03/2022)    DEXA SCAN  05/29/2024 (Originally 12/26/2021)   INFLUENZA VACCINE  09/09/2023   Pneumococcal Vaccine: 50+ Years  Completed   Zoster Vaccines- Shingrix  Completed   Hepatitis  B Vaccines  Aged Out   HPV VACCINES  Aged Out   Meningococcal B Vaccine  Aged Out    Health Maintenance  Health Maintenance Due  Topic Date Due   Medicare Annual Wellness (AWV)  05/17/2023   DTaP/Tdap/Td (2 - Td or Tdap) 08/02/2023   Health Maintenance Items Addressed: Will get tetanus booster at local pharmacy.  Additional Screening:  Vision Screening: Recommended annual ophthalmology exams for early detection of glaucoma and other disorders of the eye. Would you like a referral to an eye doctor? No    Dental Screening: Recommended annual dental exams for proper oral hygiene  Community Resource Referral / Chronic Care Management: CRR required this visit?  No   CCM required this visit?  No   Plan:    I have personally reviewed and noted the following in the patient's chart:   Medical and social history Use of alcohol, tobacco or illicit drugs  Current medications and supplements including opioid prescriptions. Patient is not currently taking opioid prescriptions. Functional ability and status Nutritional status Physical activity Advanced directives List of other physicians Hospitalizations, surgeries, and ER visits in previous 12 months Vitals Screenings to include cognitive, depression, and falls Referrals and appointments  In addition, I have reviewed and discussed with patient certain preventive protocols, quality metrics, and best practice recommendations. A written personalized care plan for preventive services as well as general preventive health recommendations were provided to patient.   Lolita Libra, CMA   08/11/2023   After Visit Summary: (In Person-Printed) AVS printed and given to the patient  Notes: See phone note

## 2023-08-15 NOTE — Telephone Encounter (Signed)
Notified pt and she voices understanding. 

## 2023-11-14 ENCOUNTER — Ambulatory Visit
Admission: EM | Admit: 2023-11-14 | Discharge: 2023-11-14 | Disposition: A | Discharge status: Home / Self Care | Attending: Family Medicine | Admitting: Family Medicine

## 2023-11-14 DIAGNOSIS — L2089 Other atopic dermatitis: Secondary | ICD-10-CM | POA: Diagnosis not present

## 2023-11-14 MED ORDER — PREDNISONE 10 MG PO TABS: ORAL_TABLET | ORAL | 0 refills | Status: DC

## 2023-11-14 NOTE — ED Provider Notes (Signed)
 [facility]  Note:  This document was prepared using Conservation officer, historic buildings and may include unintentional dictation errors.  MRN: [id number] DOB: 03-13-1940  Subjective:   Kelsey Lopez is a 83 y.o. female presenting for 1 week history of a persistent itchy rash over the folds of her elbow now extending toward the dorsal aspect of her forearms.  Denies eating any new foods, starting new medications, exposure to poisonous plants, new hygiene products, new cleaning products or detergents.  Has previously had to see a dermatologist but cannot recall for what skin condition and has not seen them in a few years.  No current facility-administered medications for this encounter.  Current Outpatient Medications:    ALPRAZolam  (XANAX ) 0.5 MG tablet, Take 0.5-1 tablets (0.25-0.5 mg total) by mouth at bedtime as needed for anxiety (limit to 5 days per week)., Disp: 30 tablet, Rfl: 0   apixaban  (ELIQUIS ) 5 MG TABS tablet, Take 1 tablet (5 mg total) by mouth 2 (two) times daily., Disp: 60 tablet, Rfl: 5   CALCIUM  PO, Take 1,800 mg by mouth daily., Disp: , Rfl:    cetirizine (ZYRTEC) 10 MG tablet, Take 10 mg by mouth daily., Disp: , Rfl:    citalopram  (CELEXA ) 20 MG tablet, TAKE 1 TABLET  EVERY DAY FOR MOOD, Disp: 90 tablet, Rfl: 3   diltiazem  (CARDIZEM  CD) 240 MG 24 hr capsule, Take  1 tablet  Daily for Heart Rhythm                                                                     /                                                                   TAKE                                         BY                                                 MOUTH, Disp: 90 capsule, Rfl: 3   Flaxseed, Linseed, (FLAX SEED OIL) 1000 MG CAPS, Take 1,000 mg by mouth daily., Disp: , Rfl:    GUAIFENESIN ER PO, Take 400 mg by mouth every morning. Mucinex, Disp: , Rfl:    Magnesium 400 MG CAPS, Take 400 mg by mouth daily., Disp: , Rfl:    meloxicam  (MOBIC ) 15 MG tablet, Take 1 tablet (15 mg  total) by mouth daily as needed for pain., Disp: 90 tablet, Rfl: 3   metoprolol  succinate (TOPROL -XL) 25 MG 24 hr tablet, Take 1 tablet (25 mg total) by mouth daily., Disp: 90 tablet, Rfl: 3   montelukast (SINGULAIR) 10 MG tablet, TAKE 1 TABLET BY MOUTH DAILY FOR ALLERGIES,  Disp: 90 tablet, Rfl: 3   Multiple Vitamin (MULTIVITAMIN) tablet, Take 1 tablet by mouth daily., Disp: , Rfl:    Multiple Vitamins-Minerals (ZINC PO), Take 25 mg by mouth daily., Disp: , Rfl:    rosuvastatin  (CRESTOR ) 20 MG tablet, Take 1 tablet (20 mg total) by mouth daily., Disp: 90 tablet, Rfl: 3   tiZANidine  (ZANAFLEX ) 4 MG tablet, Take 1 tablet (4 mg total) by mouth every 8 (eight) hours as needed for muscle spasms., Disp: 30 tablet, Rfl: 6   traMADol  (ULTRAM ) 50 MG tablet, Take 1-2 tablets (50-100 mg total) by mouth daily as needed., Disp: 30 tablet, Rfl: 0   traZODone  (DESYREL ) 150 MG tablet, Take 75-150 mg by mouth at bedtime as needed., Disp: , Rfl:    vitamin C (ASCORBIC ACID) 500 MG tablet, Take 500 mg by mouth daily., Disp: , Rfl:    Allergies  Allergen Reactions   Ace Inhibitors     Cough   Ampicillin     rash   Mevacor [Lovastatin]     Elevated LFT's   Penicillins     REACTION: hives and  itching   Sulfa Antibiotics     rash    Past Medical History:  Diagnosis Date   Anxiety    Depression, major, in remission 11/27/2014   Dyslipidemia    on Rx x20 years   HTN (hypertension)    Hyperlipidemia    Insomnia    Morton's neuroma of left foot    Multiple allergies    Osteoarthritis    Polycythemia    PVC (premature ventricular contraction)    SVT (supraventricular tachycardia) 07/12/2020   Presented to office in SVT 07/11/2020. Converted to NSR en route to ED via EMS after NS bolus.   Reported hx of remote SVT episode.        Past Surgical History:  Procedure Laterality Date   ABDOMINAL HYSTERECTOMY     CARDIOVERSION N/A 03/25/2022   Procedure: CARDIOVERSION;  Surgeon: Shlomo Wilbert SAUNDERS, MD;   Location: [facility];  Service: Cardiovascular;  Laterality: N/A;   CATARACT EXTRACTION Right 2019   Dr. Leslee    KNEE ARTHROSCOPY     R   TONSILLECTOMY     VESICOVAGINAL FISTULA CLOSURE W/ TAH      Family History  Problem Relation Age of Onset   Osteoarthritis Mother    Heart attack Mother    Heart disease Mother    Heart attack Father    Cancer Father        lung   Osteoarthritis Father    Hyperlipidemia Brother    Diabetes Brother     Social History   Tobacco Use   Smoking status: Never   Smokeless tobacco: Never   Tobacco comments:    no tobacco   Substance Use Topics   Alcohol use: Yes    Comment: rare   Drug use: Not Currently    ROS   Objective:   Vitals: BP 123/89 (BP Location: Right Arm)   Pulse (!) 101   Temp 98.3 F (36.8 C) (Oral)   Resp 16   SpO2 95%   Physical Exam Constitutional:      General: She is not in acute distress.    Appearance: Normal appearance. She is well-developed. She is not ill-appearing, toxic-appearing or diaphoretic.  HENT:     Head: Normocephalic and atraumatic.     Nose: Nose normal.     Mouth/Throat:     Mouth: Mucous membranes are moist.  Eyes:     General: No scleral icterus.       Right eye: No discharge.        Left eye: No discharge.     Extraocular Movements: Extraocular movements intact.     Conjunctiva/sclera: Conjunctivae normal.  Cardiovascular:     Rate and Rhythm: Normal rate.  Pulmonary:     Effort: Pulmonary effort is normal.  Abdominal:     General: Bowel sounds are normal. There is no distension.     Palpations: Abdomen is soft. There is no mass.     Tenderness: There is no abdominal tenderness. There is no right CVA tenderness, left CVA tenderness, guarding or rebound.  Skin:    General: Skin is warm and dry.     Findings: Rash (erythematic dry scaly patches over the flexor surfaces of her elbows bilaterally extending to over the dorsal aspect of her forearms) present.  Neurological:      General: No focal deficit present.     Mental Status: She is alert and oriented to person, place, and time.  Psychiatric:        Mood and Affect: Mood normal.        Behavior: Behavior normal.        Thought Content: Thought content normal.        Judgment: Judgment normal.     Assessment and Plan :   PDMP not reviewed this encounter.  1. Flexural atopic dermatitis    Prednisone  course for the next 10 days to address an atopic dermatitis.  Advised that she follow-up with her dermatologist.  Counseled patient on potential for adverse effects with medications prescribed/recommended today, ER and return-to-clinic precautions discussed, patient verbalized understanding.    Christopher Savannah, [place] 11/14/23 1208

## 2023-11-14 NOTE — ED Triage Notes (Signed)
 Pt reports she has an itchy rash in the arms x 1 weeks. Benadryl, hydrocortisone  cream gives no relief. Per pt she has not change, food, medications, detergents.

## 2023-11-29 ENCOUNTER — Ambulatory Visit: Admitting: Family Medicine

## 2023-11-29 ENCOUNTER — Encounter: Payer: Self-pay | Admitting: Family Medicine

## 2023-11-29 VITALS — BP 135/74 | HR 92 | Ht 65.0 in | Wt 146.0 lb

## 2023-11-29 DIAGNOSIS — I4819 Other persistent atrial fibrillation: Secondary | ICD-10-CM

## 2023-11-29 DIAGNOSIS — I10 Essential (primary) hypertension: Secondary | ICD-10-CM | POA: Diagnosis not present

## 2023-11-29 DIAGNOSIS — N182 Chronic kidney disease, stage 2 (mild): Secondary | ICD-10-CM | POA: Diagnosis not present

## 2023-11-29 DIAGNOSIS — M545 Low back pain, unspecified: Secondary | ICD-10-CM

## 2023-11-29 DIAGNOSIS — Z23 Encounter for immunization: Secondary | ICD-10-CM

## 2023-11-29 DIAGNOSIS — F419 Anxiety disorder, unspecified: Secondary | ICD-10-CM

## 2023-11-29 DIAGNOSIS — E782 Mixed hyperlipidemia: Secondary | ICD-10-CM

## 2023-11-29 LAB — COMPLETE METABOLIC PANEL WITHOUT GFR
AG Ratio: 2.1 (calc) (ref 1.0–2.5)
ALT: 23 U/L (ref 6–29)
AST: 27 U/L (ref 10–35)
Albumin: 3.9 g/dL (ref 3.6–5.1)
Alkaline phosphatase (APISO): 66 U/L (ref 37–153)
BUN: 17 mg/dL (ref 7–25)
CO2: 27 mmol/L (ref 20–32)
Calcium: 9.6 mg/dL (ref 8.6–10.4)
Chloride: 105 mmol/L (ref 98–110)
Creat: 0.83 mg/dL (ref 0.60–0.95)
Globulin: 1.9 g/dL (ref 1.9–3.7)
Glucose, Bld: 118 mg/dL — ABNORMAL HIGH (ref 65–99)
Potassium: 3.9 mmol/L (ref 3.5–5.3)
Sodium: 141 mmol/L (ref 135–146)
Total Bilirubin: 0.7 mg/dL (ref 0.2–1.2)
Total Protein: 5.8 g/dL — ABNORMAL LOW (ref 6.1–8.1)

## 2023-11-29 NOTE — Assessment & Plan Note (Signed)
 No SI/HI. PHQ/GAD reviewed.  Stable on Celexa  20 mg and PRN Xanax  (rare use) Contract/UDS up-to-date Refills not needed yet.

## 2023-11-29 NOTE — Assessment & Plan Note (Signed)
 Controlled with current medication. No palpitations, chest pain, or shortness of breath. - Continue diltiazem  and metoprolol  for rate control. - Continue Eliquis  for anticoagulation. - Following with cardiology - Patient aware of signs/symptoms requiring further/urgent evaluation.

## 2023-11-29 NOTE — Assessment & Plan Note (Signed)
Previously stable. Labs today.

## 2023-11-29 NOTE — Progress Notes (Signed)
 Establishd Patient Office Visit  Subjective    Patient ID: Kelsey Lopez, female    DOB: 1940/05/19  Age: 83 y.o. MRN: [id number]  CC:  Chief Complaint  Patient presents with   Medical Management of Chronic Issues    HPI Kelsey Lopez presents for regular follow-up. She was with her granddaughter today.    Hypertension, A. Fib, Hyperlipidemia (cardioversion 2024): - Medications: diltiazem  CD 240 mg daily, Eliquis  5 mg BID, metoprolol  succinate 25 mg daily, rosuvastatin  20 mg daily  - Compliance: good - Checking BP at home: no - Denies any SOB, recurrent headaches, CP, vision changes, LE edema, dizziness, palpitations, or medication side effects. - Diet: general - Exercise: tries to stay active - Following with [facility], Dr. Okey - At last visit with Cardio, she had elevated HR, but asymptomatic - Zio was ordered and showed persistent A. Fib HR 43-150 with avg HR 76 (03/23/2023)   CKD, stage 2: - Previously stable. Asymptomatic.  Lab Results  Component Value Date   NA 141 05/30/2023   CL 104 05/30/2023   K 3.5 05/30/2023   CO2 28 05/30/2023   BUN 12 05/30/2023   CREATININE 0.70 05/30/2023   EGFR 86 11/17/2022   CALCIUM  9.5 05/30/2023   ALBUMIN 4.0 05/30/2023   GLUCOSE 109 (H) 05/30/2023    Mood follow-up: - Diagnosis: anxiety  - Treatment: Celexa  20 mg daily, Xanax  0.25-0.5 mg daily PRN - Medication side effects: none - SI/HI: no - Update: Doing well. Rarely needing Xanax .   Arthritis, Chronic back pain, scoliosis: - Meloxicam . Tramadol  PRN - using rarely - Staying active.       11/29/2023   10:29 AM 08/11/2023   10:04 AM 05/30/2023    2:22 PM  PHQ9 SCORE ONLY  PHQ-9 Total Score 0 1  1      Data saved with a previous flowsheet row definition      11/29/2023   10:29 AM 05/30/2023    2:22 PM  GAD 7 : Generalized Anxiety Score  Nervous, Anxious, on Edge 0 1  Control/stop worrying 0 1  Worry too much - different things 0 1  Trouble relaxing 0  1  Restless 0 0  Easily annoyed or irritable 0 0  Afraid - awful might happen 0 1  Total GAD 7 Score 0 5  Anxiety Difficulty Not difficult at all Not difficult at all      ROS All review of systems negative except what is listed in the HPI      Objective    BP 135/74   Pulse 92   Ht 5' 5 (1.651 m)   Wt 146 lb (66.2 kg)   SpO2 98%   BMI 24.30 kg/m   Physical Exam Vitals reviewed.  Constitutional:      Appearance: Normal appearance.  Cardiovascular:     Rate and Rhythm: Normal rate. Rhythm irregular.     Heart sounds: Normal heart sounds.  Pulmonary:     Effort: Pulmonary effort is normal.     Breath sounds: Normal breath sounds.  Musculoskeletal:     Right lower leg: No edema.     Left lower leg: No edema.  Skin:    General: Skin is warm and dry.  Neurological:     Mental Status: She is alert and oriented to person, place, and time.  Psychiatric:        Mood and Affect: Mood normal.        Behavior: Behavior normal.  Thought Content: Thought content normal.        Judgment: Judgment normal.         Assessment & Plan:   Problem List Items Addressed This Visit       Active Problems   Hyperlipidemia, mixed (Chronic)   Medication management: rosuvastatin  20 mg daily  Lifestyle factors for lowering cholesterol include: Diet therapy - heart-healthy diet rich in fruits, veggies, fiber-rich whole grains, lean meats, chicken, fish (at least twice a week), fat-free or 1% dairy products; foods low in saturated/trans fats, cholesterol, sodium, and sugar. Mediterranean diet has shown to be very heart healthy. Regular exercise - recommend at least 30 minutes a day, 5 times per week Weight management  Labs stable at last check. Following with cardiology        Essential hypertension (Chronic)   Blood pressure is at goal for age and co-morbidities.   Recommendations: Cardizem  CD 240 mg daily, metoprolol  succinate 25 mg daily  - BP goal <130/80 -  monitor and log blood pressures at home - check around the same time each day in a relaxed setting - Limit salt to <2000 mg/day - Follow DASH eating plan (heart healthy diet) - limit alcohol to 2 standard drinks per day for men and 1 per day for women - avoid tobacco products - get at least 2 hours of regular aerobic exercise weekly Patient aware of signs/symptoms requiring further/urgent evaluation. Following with cardiology       Relevant Orders   COMPLETE METABOLIC PANEL WITH eGFR   Anxiety - Primary (Chronic)   No SI/HI. PHQ/GAD reviewed.  Stable on Celexa  20 mg and PRN Xanax  (rare use) Contract/UDS up-to-date Refills not needed yet.      CKD (chronic kidney disease) stage 2, GFR 60-89 ml/min (Chronic)   Previously stable. Labs today       Persistent atrial fibrillation (HCC) (Chronic)   Controlled with current medication. No palpitations, chest pain, or shortness of breath. - Continue diltiazem  and metoprolol  for rate control. - Continue Eliquis  for anticoagulation. - Following with cardiology - Patient aware of signs/symptoms requiring further/urgent evaluation.       Other Visit Diagnoses       Immunization due       Relevant Orders   Flu vaccine HIGH DOSE PF(Fluzone Trivalent) (Completed)         Return in about 6 months (around 05/29/2024) for chronic disease management 5-6 months.   Waddell KATHEE Mon, NP

## 2023-11-29 NOTE — Assessment & Plan Note (Signed)
 Blood pressure is at goal for age and co-morbidities.   Recommendations: Cardizem  CD 240 mg daily, metoprolol  succinate 25 mg daily  - BP goal <130/80 - monitor and log blood pressures at home - check around the same time each day in a relaxed setting - Limit salt to <2000 mg/day - Follow DASH eating plan (heart healthy diet) - limit alcohol to 2 standard drinks per day for men and 1 per day for women - avoid tobacco products - get at least 2 hours of regular aerobic exercise weekly Patient aware of signs/symptoms requiring further/urgent evaluation. Following with cardiology

## 2023-11-29 NOTE — Assessment & Plan Note (Signed)
 Medication management: rosuvastatin  20 mg daily  Lifestyle factors for lowering cholesterol include: Diet therapy - heart-healthy diet rich in fruits, veggies, fiber-rich whole grains, lean meats, chicken, fish (at least twice a week), fat-free or 1% dairy products; foods low in saturated/trans fats, cholesterol, sodium, and sugar. Mediterranean diet has shown to be very heart healthy. Regular exercise - recommend at least 30 minutes a day, 5 times per week Weight management  Labs stable at last check. Following with cardiology

## 2023-12-01 ENCOUNTER — Ambulatory Visit: Payer: Self-pay | Admitting: Family Medicine

## 2024-02-19 ENCOUNTER — Other Ambulatory Visit: Payer: Self-pay | Admitting: Family Medicine

## 2024-02-19 DIAGNOSIS — I4819 Other persistent atrial fibrillation: Secondary | ICD-10-CM

## 2024-03-06 ENCOUNTER — Encounter: Payer: Medicare Other | Admitting: Internal Medicine

## 2024-08-14 ENCOUNTER — Ambulatory Visit
